# Patient Record
Sex: Female | Born: 1965 | Race: Black or African American | Hispanic: No | Marital: Single | Smoking: Current some day smoker
Health system: Southern US, Community
[De-identification: ages and names within clinical notes are randomized; demographics above are authoritative.]

## PROBLEM LIST (undated history)

## (undated) DIAGNOSIS — R569 Unspecified convulsions: Secondary | ICD-10-CM

## (undated) DIAGNOSIS — I219 Acute myocardial infarction, unspecified: Secondary | ICD-10-CM

## (undated) DIAGNOSIS — F419 Anxiety disorder, unspecified: Secondary | ICD-10-CM

## (undated) DIAGNOSIS — K219 Gastro-esophageal reflux disease without esophagitis: Secondary | ICD-10-CM

## (undated) DIAGNOSIS — F209 Schizophrenia, unspecified: Secondary | ICD-10-CM

## (undated) DIAGNOSIS — I10 Essential (primary) hypertension: Secondary | ICD-10-CM

## (undated) DIAGNOSIS — E7800 Pure hypercholesterolemia, unspecified: Secondary | ICD-10-CM

## (undated) DIAGNOSIS — G4730 Sleep apnea, unspecified: Secondary | ICD-10-CM

## (undated) DIAGNOSIS — F32A Depression, unspecified: Secondary | ICD-10-CM

## (undated) HISTORY — DX: Gastro-esophageal reflux disease without esophagitis: K21.9

## (undated) HISTORY — PX: DILATION AND CURETTAGE OF UTERUS: SHX78

## (undated) HISTORY — DX: Sleep apnea, unspecified: G47.30

## (undated) HISTORY — DX: Essential (primary) hypertension: I10

## (undated) HISTORY — PX: TONSILLECTOMY: SUR1361

## (undated) HISTORY — DX: Depression, unspecified: F32.A

## (undated) HISTORY — PX: FOOT SURGERY: SHX648

## (undated) HISTORY — PX: CARDIAC CATHETERIZATION: SHX172

## (undated) HISTORY — DX: Pure hypercholesterolemia, unspecified: E78.00

---

## 2011-12-07 ENCOUNTER — Emergency Department: Payer: Self-pay | Admitting: Emergency Medicine

## 2012-05-05 ENCOUNTER — Emergency Department: Payer: Self-pay | Admitting: Emergency Medicine

## 2014-09-28 ENCOUNTER — Ambulatory Visit: Payer: Self-pay | Admitting: Internal Medicine

## 2014-12-03 ENCOUNTER — Emergency Department: Payer: Self-pay | Admitting: Emergency Medicine

## 2016-04-08 ENCOUNTER — Emergency Department: Payer: Medicare Other

## 2016-04-08 ENCOUNTER — Emergency Department
Admission: EM | Admit: 2016-04-08 | Discharge: 2016-04-08 | Disposition: A | Discharge status: Home / Self Care | Payer: Medicare Other | Attending: Student | Admitting: Student

## 2016-04-08 ENCOUNTER — Encounter: Payer: Self-pay | Admitting: Emergency Medicine

## 2016-04-08 DIAGNOSIS — Z79899 Other long term (current) drug therapy: Secondary | ICD-10-CM | POA: Insufficient documentation

## 2016-04-08 DIAGNOSIS — F419 Anxiety disorder, unspecified: Secondary | ICD-10-CM

## 2016-04-08 DIAGNOSIS — F250 Schizoaffective disorder, bipolar type: Secondary | ICD-10-CM

## 2016-04-08 DIAGNOSIS — Z87891 Personal history of nicotine dependence: Secondary | ICD-10-CM | POA: Insufficient documentation

## 2016-04-08 DIAGNOSIS — I252 Old myocardial infarction: Secondary | ICD-10-CM | POA: Diagnosis not present

## 2016-04-08 DIAGNOSIS — F259 Schizoaffective disorder, unspecified: Secondary | ICD-10-CM

## 2016-04-08 DIAGNOSIS — Z8669 Personal history of other diseases of the nervous system and sense organs: Secondary | ICD-10-CM | POA: Diagnosis not present

## 2016-04-08 HISTORY — DX: Schizophrenia, unspecified: F20.9

## 2016-04-08 HISTORY — DX: Acute myocardial infarction, unspecified: I21.9

## 2016-04-08 HISTORY — DX: Unspecified convulsions: R56.9

## 2016-04-08 HISTORY — DX: Anxiety disorder, unspecified: F41.9

## 2016-04-08 LAB — COMPREHENSIVE METABOLIC PANEL
ALBUMIN: 4.1 g/dL (ref 3.5–5.0)
ALK PHOS: 70 U/L (ref 38–126)
ALT: 20 U/L (ref 14–54)
ANION GAP: 7 (ref 5–15)
AST: 21 U/L (ref 15–41)
BILIRUBIN TOTAL: 0.6 mg/dL (ref 0.3–1.2)
BUN: 18 mg/dL (ref 6–20)
CALCIUM: 9.2 mg/dL (ref 8.9–10.3)
CO2: 22 mmol/L (ref 22–32)
CREATININE: 0.85 mg/dL (ref 0.44–1.00)
Chloride: 108 mmol/L (ref 101–111)
GFR calc Af Amer: >60 mL/min (ref >60)
GFR calc non Af Amer: >60 mL/min (ref >60)
GLUCOSE: 96 mg/dL (ref 65–99)
Potassium: 3.6 mmol/L (ref 3.5–5.1)
Sodium: 137 mmol/L (ref 135–145)
TOTAL PROTEIN: 7.8 g/dL (ref 6.5–8.1)

## 2016-04-08 LAB — CBC WITH DIFFERENTIAL/PLATELET
Basophils Absolute: 0.1 10*3/uL (ref 0–0.1)
Eosinophils Absolute: 0.1 10*3/uL (ref 0–0.7)
Eosinophils Relative: 1 %
HEMATOCRIT: 37.9 % (ref 35.0–47.0)
HEMOGLOBIN: 12.6 g/dL (ref 12.0–16.0)
LYMPHS ABS: 2.9 10*3/uL (ref 1.0–3.6)
Lymphocytes Relative: 29 %
MCH: 28.1 pg (ref 26.0–34.0)
MCHC: 33.2 g/dL (ref 32.0–36.0)
MCV: 84.4 fL (ref 80.0–100.0)
Monocytes Absolute: 0.5 10*3/uL (ref 0.2–0.9)
NEUTROS ABS: 6.3 10*3/uL (ref 1.4–6.5)
Platelets: 225 10*3/uL (ref 150–440)
RBC: 4.49 MIL/uL (ref 3.80–5.20)
RDW: 14.8 % — ABNORMAL HIGH (ref 11.5–14.5)
WBC: 9.8 10*3/uL (ref 3.6–11.0)

## 2016-04-08 LAB — ETHANOL

## 2016-04-08 LAB — SALICYLATE LEVEL: Salicylate Lvl: <4 mg/dL (ref 2.8–30.0)

## 2016-04-08 LAB — ACETAMINOPHEN LEVEL: Acetaminophen (Tylenol), Serum: <10 ug/mL — ABNORMAL LOW (ref 10–30)

## 2016-04-08 LAB — FIBRIN DERIVATIVES D-DIMER (ARMC ONLY): Fibrin derivatives D-dimer (ARMC): 334 (ref 0–499)

## 2016-04-08 LAB — TROPONIN I: Troponin I: <0.03 ng/mL (ref <0.031)

## 2016-04-08 MED ORDER — LORAZEPAM 1 MG PO TABS
1.0000 mg | ORAL_TABLET | Freq: Once | ORAL | Status: AC
  Administered 2016-04-08: 1 mg via ORAL
  Filled 2016-04-08: qty 1

## 2016-04-08 MED ORDER — HALOPERIDOL 1 MG PO TABS: 1.0000 mg | ORAL_TABLET | Freq: Two times a day (BID) | ORAL | Status: DC

## 2016-04-08 NOTE — BH Assessment (Signed)
Assessment Note  Kelly [name] is an 50 y.o. female who presents to the ER, initially due to having concerns about her anxiety.  Patient states she is having seizures and difficulty breathing. She was requesting to be put in a comma and placed on a breathing machine. However, patient has a history of schizophrenia and hasn't taking her medications in over two weeks. She was receiving outpatient treatment with [facility]. She stop going, due to "the doctor didn't listen to me. I told him the medicine wasn't working but he kept trying to make it work. So I fixed him. I stop going and stop taking the medicine (Abilify Kelly [name])." She further states, she had her neurologist prescribe her psychotropic medications. "He's be encouraging me to go to a psychiatrist t but I can't find one who takes Medicare."  Initially part of the interview, the patient was guarded and suspicious of this Clinical research associatewriter. She eventually opened up and admitted she was having symptoms of her schizophrenia. The last four days have been the worse. She's seeing family members on the outside of her home. On last night she seen a cousin "and I know they are back home in [place] but you can't tell me she wasn't walking around, outside." She was hearing people knocking on her front door and was confused why they wasn't using the doorbell. For the last two days, she hasn't slept and barely ate. For the last month, she has had an increase of panic attacks and paranoia. Patient has had multiple inpatient stays due to similar presentation. Overall, "this last year have been the worse. It's like I been on this downward spiral."  Patient has two adult children and haven't talked with them in over a year. "I just don't won't to be bothered. I just want to be left alone." She further states, she don't have many natural supports. She currently lives independent, in her own apartment.  Patient is originally from [place]. She moved to  [place] approximately 3 years ago to take care of her mother. Mother was diagnosed with breast cancer. After the mother passed away, she moved to [place]. The move took place approximately a year and a half ago. "When I was in [place] I was on the right medicine. When I moved down her, they mess me all the way up." The regiment of medications that have worked for her was; Haldol, Lithium and Proxalin.  Diagnosis: Schizophrenia  Past Medical History:  Past Medical History  Diagnosis Date  . Anxiety   . Seizures (HCC)   . Schizophrenia (HCC)   . Myocardial infarction [facility](HCC)     Past Surgical History  Procedure Laterality Date  . Cardiac catheterization    . Tonsillectomy    . Dilation and curettage of uterus      Family History: No family history on file.  Social History:  reports that she has quit smoking. She does not have any smokeless tobacco history on file. She reports that she does not drink alcohol. Her drug history is not on file.  Additional Social History:  Alcohol / Drug Use Pain Medications: See PTA Prescriptions: See PTA Over the Counter: See PTA History of alcohol / drug use?: No history of alcohol / drug abuse (Reports of having no history abuse) Negative Consequences of Use:  (Reports of having no history abuse) Withdrawal Symptoms:  (Reports of having no history abuse)  CIWA: CIWA-Ar BP: (!) 156/75 mmHg Pulse Rate: 73 COWS:    Allergies: No Known  Allergies  Home Medications:  (Not in a hospital admission)  OB/GYN Status:  Patient's last menstrual period was 03/16/2016.  General Assessment Data Location of Assessment: [facility] ED TTS Assessment: In system Is this a Tele or Face-to-Face Assessment?: Face-to-Face Is this an Initial Assessment or a Re-assessment for this encounter?: Initial Assessment Marital status: Single Maiden name: n/a Is patient pregnant?: No Pregnancy Status: No Living Arrangements: Alone Can pt return to current living  arrangement?: Yes Admission Status: Involuntary Is patient capable of signing voluntary admission?: Yes Referral Source: Self/Family/Friend Insurance type: Medicare  Medical Screening Exam [facility]) Medical Exam completed: Yes  Crisis Care Plan Living Arrangements: Alone Legal Guardian: Other: (None) Name of Psychiatrist: Was with [facility]  Name of Therapist: Was with [facility]   Education Status Is patient currently in school?: No Current Grade: n/a Highest grade of school patient has completed: 12th Grade Name of school: n/a Contact person: n/a  Risk to self with the past 6 months Suicidal Ideation: No Has patient been a risk to self within the past 6 months prior to admission? : No Suicidal Intent: No Has patient had any suicidal intent within the past 6 months prior to admission? : No Is patient at risk for suicide?: No Suicidal Plan?: No Has patient had any suicidal plan within the past 6 months prior to admission? : No Access to Means: No What has been your use of drugs/alcohol within the last 12 months?: THC and Alcohol Previous Attempts/Gestures: Yes How many times?: 3 Other Self Harm Risks: Reports of none Triggers for Past Attempts: Unpredictable Intentional Self Injurious Behavior: None Family Suicide History: Unknown Recent stressful life event(s): Other (Comment), Loss (Comment) (Off medications) Persecutory voices/beliefs?: No Depression: Yes Depression Symptoms: Feeling angry/irritable, Feeling worthless/self pity, Loss of interest in usual pleasures, Guilt, Fatigue, Isolating, Tearfulness Substance abuse history and/or treatment for substance abuse?: No Suicide prevention information given to non-admitted patients: Not applicable  Risk to Others within the past 6 months Homicidal Ideation: No Does patient have any lifetime risk of violence toward others beyond the six months prior to admission? : No Thoughts of  Harm to Others: No Current Homicidal Intent: No Current Homicidal Plan: No Access to Homicidal Means: No Identified Victim: Reports of none History of harm to others?: No Assessment of Violence: None Noted Violent Behavior Description: Reports of none Does patient have access to weapons?: No Criminal Charges Pending?: No Does patient have a court date: No Is patient on probation?: No  Psychosis Hallucinations: Auditory, Visual Delusions: Unspecified  Mental Status Report Appearance/Hygiene: In scrubs, In hospital gown, Unremarkable Eye Contact: Good Motor Activity: Unremarkable, Freedom of movement Speech: Logical/coherent, Unremarkable Level of Consciousness: Alert Mood: Anxious, Pleasant, Depressed Affect: Appropriate to circumstance Anxiety Level: Minimal Thought Processes: Coherent, Relevant Judgement: Partial Orientation: Person, Place, Time, Situation, Appropriate for developmental age Obsessive Compulsive Thoughts/Behaviors: Minimal  Cognitive Functioning Concentration: Normal Memory: Recent Intact, Remote Intact IQ: Average Insight: Fair Impulse Control: Poor Appetite: Fair Weight Loss: 10 (Within 30 days) Weight Gain: 0 Sleep: Decreased Total Hours of Sleep: 2 Vegetative Symptoms: None  ADLScreening [facility] Assessment Services) Patient's cognitive ability adequate to safely complete daily activities?: Yes Patient able to express need for assistance with ADLs?: Yes Independently performs ADLs?: Yes (appropriate for developmental age)  Prior Inpatient Therapy Prior Inpatient Therapy: Yes Prior Therapy Dates: 2015 Prior Therapy Facilty/Provider(s): [facility] in [place]  Reason for Treatment: Schizophrenia  Prior Outpatient Therapy Prior Outpatient Therapy: Yes Prior Therapy Dates:  02/2016 Prior Therapy Facilty/Provider(s): [facility] Reason for Treatment: Schizophrenia Does patient have an ACCT team?: No Does patient have Intensive  In-House Services?  : No Does patient have [facility] services? : No Does patient have P4CC services?: No  ADL Screening (condition at time of admission) Patient's cognitive ability adequate to safely complete daily activities?: Yes Is the patient deaf or have difficulty hearing?: No Does the patient have difficulty seeing, even when wearing glasses/contacts?: No Does the patient have difficulty concentrating, remembering, or making decisions?: No Patient able to express need for assistance with ADLs?: Yes Does the patient have difficulty dressing or bathing?: No Independently performs ADLs?: Yes (appropriate for developmental age) Does the patient have difficulty walking or climbing stairs?: No Weakness of Legs: None Weakness of Arms/Hands: None  Home Assistive Devices/Equipment Home Assistive Devices/Equipment: None  Therapy Consults (therapy consults require a physician order) PT Evaluation Needed: No OT Evalulation Needed: No SLP Evaluation Needed: No Abuse/Neglect Assessment (Assessment to be complete while patient is alone) Physical Abuse: Denies Verbal Abuse: Denies Sexual Abuse: Denies Exploitation of patient/patient's resources: Denies Self-Neglect: Denies Values / Beliefs Cultural Requests During Hospitalization: None Spiritual Requests During Hospitalization: None Consults Spiritual Care Consult Needed: No Social Work Consult Needed: No      Additional Information 1:1 In Past 12 Months?: No CIRT Risk: No Elopement Risk: No Does patient have medical clearance?: Yes  Child/Adolescent Assessment Running Away Risk: Denies (Patient is an adult)  Disposition:  Disposition Initial Assessment Completed for this Encounter: Yes Disposition of Patient: Other dispositions (ER MD ordered Psych Consult) Other disposition(s): Other (Comment) (ER MD ordered Psych Consult)  On Site Evaluation by:   Reviewed with Physician:    Lilyan Gilford MS, LCAS, LPC, NCC,  CCSI Therapeutic Triage Specialist 04/08/2016 12:59 PM

## 2016-04-08 NOTE — ED Notes (Signed)
Pt stated she can't breathe because she has cancer. The doctors say she doesn't but she knows better.

## 2016-04-08 NOTE — ED Notes (Signed)
Pt changed into purple scrubs, underwear and socks. Pt belonging in two bags, labeled and placed next to quad RN at this time.

## 2016-04-08 NOTE — ED Notes (Addendum)
Pt states "I want to be put in a coma and put on the breathing machine so I can breathe easier". Pt reports she's having trouble breathing due to her sinuses draining. Pt reports URI sx due to standing outside at night in the rain. Pt reports she's been standing outside in the rain due to someone knocking on her door at night and not using the doorbell; reports she's been outside looking for him. Pt reports reduced use of abilify and haldol without doctor recommendation.

## 2016-04-08 NOTE — ED Notes (Signed)
Pt given lunch tray. Calvin with pt at this time.

## 2016-04-08 NOTE — ED Notes (Signed)
Pt placed on monitor. Pt informed urine is needed and pt stated she is tired of us asking for stuff when the only thing she ask for is a coma and we will not do that.

## 2016-04-08 NOTE — ED Notes (Signed)
Pt reports clitoris piercing and right nipple ring; MD notified, permission to leave in patient.

## 2016-04-08 NOTE — ED Notes (Signed)
Pt lying in bed resting

## 2016-04-08 NOTE — ED Provider Notes (Signed)
[facility] Emergency Department Provider Note   ____________________________________________  Time seen: Approximately 11:02 AM  I have reviewed the triage vital signs and the nursing notes.   HISTORY  Chief Complaint Shortness of Breath and Anxiety    HPI Kelly [name] is a 50 y.o. female history of coronary artery disease, anxiety provoked seizures, schizoaffective disorder who presents for evaluation of anxiety and shortness of breath over the past 4 days, gradual onset, constant, severe. The patient reports that she has been having 4 days of cough, nasal congestion worse at night because she feels like her sinuses are draining. She is requesting  "to be put into a coma and put on a breathing machine until my breathing is better.Marland Kitchen.Marland Kitchen.I don't know why you can't just do that". She also reports that she has been standing outside in the rain at night because somebody is coming to knock on her door repeatedly and they are not using the doorbell. Every time she goes to answer the door, no one is there. She has not been compliant with her antipsychotic medications. She denies any chest pain. No fevers, no vomiting or diarrhea. No SI or HI. She had an anxiety induced seizure last night.   Past Medical History  Diagnosis Date  . Anxiety   . Seizures (HCC)   . Schizophrenia (HCC)   . Myocardial infarction [facility](HCC)     Patient Active Problem List   Diagnosis Date Noted  . Schizoaffective disorder (HCC) 04/08/2016  . Acute anxiety 04/08/2016    Past Surgical History  Procedure Laterality Date  . Cardiac catheterization    . Tonsillectomy    . Dilation and curettage of uterus      Current Outpatient Rx  Name  Route  Sig  Dispense  Refill  . citalopram (CELEXA) 20 MG tablet   Oral   Take 1 tablet by mouth daily.      3   . lamoTRIgine (LAMICTAL) 200 MG tablet   Oral   Take 200 mg by mouth 2 (two) times daily.      2   . VIMPAT 200 MG TABS tablet  Oral   Take 200 mg by mouth 3 (three) times daily.      3     Dispense as written.   . zonisamide (ZONEGRAN) 100 MG capsule   Oral   Take 200 mg by mouth at bedtime.      2   . haloperidol (HALDOL) 1 MG tablet   Oral   Take 1 tablet (1 mg total) by mouth 2 (two) times daily.   60 tablet   0     Allergies Review of patient's allergies indicates no known allergies.  No family history on file.  Social History Social History  Substance Use Topics  . Smoking status: Former Games developermoker  . Smokeless tobacco: None  . Alcohol Use: No    Review of Systems Constitutional: No fever/chills Eyes: No visual changes. ENT: No sore throat. Cardiovascular: Denies chest pain. Respiratory: + shortness of breath. Gastrointestinal: No abdominal pain.  No nausea, no vomiting.  No diarrhea.  No constipation. Genitourinary: Negative for dysuria. Musculoskeletal: Negative for back pain. Skin: Negative for rash. Neurological: Negative for headaches, focal weakness or numbness.  10-point ROS otherwise negative.  ____________________________________________   PHYSICAL EXAM:  VITAL SIGNS: ED Triage Vitals  Enc Vitals Group     BP 04/08/16 0955 156/75 mmHg     Pulse Rate 04/08/16 0955 73  Resp 04/08/16 0955 18     Temp 04/08/16 0955 99.1 F (37.3 C)     Temp Source 04/08/16 0955 Oral     SpO2 04/08/16 0955 97 %     Weight 04/08/16 0955 194 lb 3 oz (88.083 kg)     Height 04/08/16 0955 5\' 5"  (1.651 m)     Head Cir --      Peak Flow --      Pain Score --      Pain Loc --      Pain Edu? --      Excl. in GC? --     Constitutional: Alert and oriented. Well appearing and in no acute distress. Eyes: Conjunctivae are normal. PERRL. EOMI. Head: Atraumatic. Nose: No congestion/rhinnorhea. Mouth/Throat: Mucous membranes are moist.  Oropharynx non-erythematous. Neck: No stridor.  Supple without meningismus. Cardiovascular: Normal rate, regular rhythm. Grossly normal heart sounds.   Good peripheral circulation. Respiratory: Normal respiratory effort.  No retractions. Lungs CTAB. Gastrointestinal: Soft and nontender. No distention.  No CVA tenderness. Genitourinary: deferred Musculoskeletal: No lower extremity tenderness nor edema.  No joint effusions. Neurologic:  Normal speech and language. No gross focal neurologic deficits are appreciated. No gait instability. Skin:  Skin is warm, dry and intact. No rash noted. Psychiatric: Mood is anxious, affect is somewhat labile. She does have significant psychomotor agitation.  ____________________________________________   LABS (all labs ordered are listed, but only abnormal results are displayed)  Labs Reviewed  CBC WITH DIFFERENTIAL/PLATELET - Abnormal; Notable for the following:    RDW 14.8 (*)    All other components within normal limits  ACETAMINOPHEN LEVEL - Abnormal; Notable for the following:    Acetaminophen (Tylenol), Serum <10 (*)    All other components within normal limits  COMPREHENSIVE METABOLIC PANEL  TROPONIN I  FIBRIN DERIVATIVES D-DIMER ([facility] ONLY)  ETHANOL  SALICYLATE LEVEL  URINALYSIS COMPLETEWITH MICROSCOPIC ([facility] ONLY)  POC URINE PREG, ED   ____________________________________________  EKG  ED ECG REPORT I, Gayla Doss, the attending physician, personally viewed and interpreted this ECG.   Date: 04/08/2016  EKG Time: 09:54  Rate: 77  Rhythm:  normal sinus rhythm  Axis: normal  Intervals:none  ST&T Change: No acute ST elevation. Q waves in lead 1, 2, aVL. T wave inversions in 1, 2, aVL. No prior EKG available for comparison.  ____________________________________________  RADIOLOGY  CXR  IMPRESSION: Negative, no acute cardiopulmonary abnormality. ____________________________________________   PROCEDURES  Procedure(s) performed: None  Critical Care performed: No  ____________________________________________   INITIAL IMPRESSION / ASSESSMENT AND PLAN / ED  COURSE  Pertinent labs & imaging results that were available during my care of the patient were reviewed by me and considered in my medical decision making (see chart for details).  Kelly [name] is a 50 y.o. female history of coronary artery disease, anxiety provoked seizures, schizoaffective disorder who presents for evaluation of anxiety and shortness of breath over the past 4 days, gradual onset, constant, severe. On exam she is nontoxic appearing and in no acute distress. Vital signs stable, she is afebrile. No increased work of breathing, clear lung sounds. EKG is reassuring, chest x-ray shows no acute cardio pulmonary abnormality. Suspect that her symptoms may be secondary to sinusitis but that been going on for 4 days and is likely viral. We'll obtain screening psychiatry labs, consult behavioral health as well psychiatry, will observe briefly on the cardiac monitor.  ----------------------------------------- 3:27 PM on 04/08/2016 ----------------------------------------- CBC, CMP unremarkable. Negative troponin, doubt atypical ACS  presentation. D-dimer is not elevated, doubt PE, undetectable ethanol, salicylate and acetaminophen levels. Dr. Toni Amend has evaluated the patient, recommends discharge, he has provided a perscription for Haldol and she will follow-up with [facility]. DC with return precautions, she is comfortable with the discharge plan. ____________________________________________   FINAL CLINICAL IMPRESSION(S) / ED DIAGNOSES  Final diagnoses:  Anxiety  Schizoaffective disorder, unspecified type (HCC)      NEW MEDICATIONS STARTED DURING THIS VISIT:  Current Discharge Medication List    START taking these medications   Details  haloperidol (HALDOL) 1 MG tablet Take 1 tablet (1 mg total) by mouth 2 (two) times daily. Qty: 60 tablet, Refills: 0         Note:  This document was prepared using Dragon voice recognition software and may include unintentional dictation  errors.    Gayla Doss, MD 04/08/16 [phone]

## 2016-04-08 NOTE — ED Notes (Signed)
Pt arrived via EMS from home for reports of shortness of breath and anxiety. Pt states she had a seizure last night and she thinks she may have had one this morning. Pt told EMS her neurologist thinks her seizures are anxiety induced and is treating them with anxiety medication. EMS reports pt was hyperventilating upon their arrival. EMS reports CBG 91, 144/61, 67 HR, 100% RA, pCO2 30, NSR.

## 2016-04-08 NOTE — Consult Note (Signed)
Kelly [name]   Reason for [name]:  [name] for this 50 year old woman with a reported past history of mental illness who came to the emergency room reporting that she has trouble breathing Referring Physician:  Edd Fabian Patient Identification: Kelly [name] MRN:  [id number] Principal Diagnosis: Schizoaffective disorder [facility]) Diagnosis:   Patient Active Problem List   Diagnosis Date Noted  . Schizoaffective disorder ([place]) [F25.9] 04/08/2016  . Acute anxiety [F41.9] 04/08/2016    Total Time spent with patient: 1 hour  Subjective:   Kelly [name] is a 50 y.o. female patient admitted with "I feel like I can't catch my breath but they say my breathing is fine".  HPI:  50 year old woman. Patient interviewed. Chart reviewed. Case discussed with emergency room physician and TTS. Labs and vitals reviewed. Patient says that for the last month or so she's been feeling increasingly anxious. Feels nervous much of the time. Starting to have what sound like panic attacks symptoms fairly regularly. Feels like she can't catch her breath although her breathing here in the emergency room is completely fine. She says she sleeps poorly at night and will nap some during the day. Mood feels down and nervous much of the time. Recently has had a few episodes of seeing things such as seeing her sister walking around when she knows that her sister lives in [place]. Patient says she is taking her current medicines but she is not going to see a psychiatrist. She is only being prescribed medicine for her seizures right now. Patient denies any suicidal ideation or wish to harm herself. Denies homicidal ideation. She says that she has a history of "schizophrenia and bipolar disorder" and used to go to [facility] but stopped going several months ago because she didn't like the environment. Denies abuse of alcohol or any other recreational drugs. Says that she stays pretty isolated and feels like she has a  lot of worries.  Social history: Patient is currently living by herself in [place]. She had been in [place] until a couple years ago when she moved to [place] to be near her mother. Mother died shortly thereafter and the patient has just stayed around here. There he limited social contacts.  Medical history: History of a seizure disorder. Sees a neurologist in. She says that when she gets anxious she has more of these seizures. No other known medical problem.  Substance abuse history: Denies alcohol or drug abuse and denies any past history of substance abuse issues.  Past Psychiatric History: Patient says she's had many admissions to psychiatric hospitals going back years. She claims to have been diagnosed as either schizophrenic or having bipolar disorder. Putting that together with her current presentation I would guess that she probably has schizoaffective disorder. She says when she was taking lithium and Haldol years ago she found it to be effective but she is not on any of that medicine currently. She tried to get her neurologist to prescribe it but he just wants her to see a psychiatrist. Patient says that she's only had 1 suicide attempt and it was over a decade ago. No recent suicidal ideation. Denies any history of violence to others.  Risk to Self: Suicidal Ideation: No Suicidal Intent: No Is patient at risk for suicide?: No Suicidal Plan?: No Access to Means: No What has been your use of drugs/alcohol within the last 12 months?: THC and Alcohol How many times?: 3 Other Self Harm Risks: Reports of none Triggers for Past Attempts: Unpredictable Intentional Self  Injurious Behavior: None Risk to Others: Homicidal Ideation: No Thoughts of Harm to Others: No Current Homicidal Intent: No Current Homicidal Plan: No Access to Homicidal Means: No Identified Victim: Reports of none History of harm to others?: No Assessment of Violence: None Noted Violent Behavior Description:  Reports of none Does patient have access to weapons?: No Criminal Charges Pending?: No Does patient have a court date: No Prior Inpatient Therapy: Prior Inpatient Therapy: Yes Prior Therapy Dates: 2015 Prior Therapy Facilty/Provider(s): [facility] in [place]  Reason for Treatment: Schizophrenia Prior Outpatient Therapy: Prior Outpatient Therapy: Yes Prior Therapy Dates: 02/2016 Prior Therapy Facilty/Provider(s): [facility] Reason for Treatment: Schizophrenia Does patient have an ACCT team?: No Does patient have Intensive In-House Services?  : No Does patient have [facility] services? : No Does patient have P4CC services?: No  Past Medical History:  Past Medical History  Diagnosis Date  . Anxiety   . Seizures ([place])   . Schizophrenia ([place])   . Myocardial infarction [facility])     Past Surgical History  Procedure Laterality Date  . Cardiac catheterization    . Tonsillectomy    . Dilation and curettage of uterus     Family History: No family history on file. Family Psychiatric  History: Patient denies being aware of any family history of mental illness or substance abuse problems whatsoever. Social History:  History  Alcohol Use No     History  Drug Use Not on file    Social History   Social History  . Marital Status: Single    Spouse Name: N/A  . Number of Children: N/A  . Years of Education: N/A   Social History Main Topics  . Smoking status: Former Research scientist (life sciences)  . Smokeless tobacco: None  . Alcohol Use: No  . Drug Use: None  . Sexual Activity: Not Asked   Other Topics Concern  . None   Social History Narrative  . None   Additional Social History:    Allergies:  No Known Allergies  Labs:  Results for orders placed or performed during the hospital encounter of 04/08/16 (from the past 48 hour(s))  CBC with Differential     Status: Abnormal   Collection Time: 04/08/16 10:15 AM  Result Value Ref Range   WBC 9.8 3.6 - 11.0 K/uL   RBC 4.49 3.80 - 5.20  MIL/uL   Hemoglobin 12.6 12.0 - 16.0 g/dL   HCT 37.9 35.0 - 47.0 %   MCV 84.4 80.0 - 100.0 fL   MCH 28.1 26.0 - 34.0 pg   MCHC 33.2 32.0 - 36.0 g/dL   RDW 14.8 (H) 11.5 - 14.5 %   Platelets 225 150 - 440 K/uL   Neutrophils Relative % 64% %   Neutro Abs 6.3 1.4 - 6.5 K/uL   Lymphocytes Relative 29% %   Lymphs Abs 2.9 1.0 - 3.6 K/uL   Monocytes Relative 5% %   Monocytes Absolute 0.5 0.2 - 0.9 K/uL   Eosinophils Relative 1% %   Eosinophils Absolute 0.1 0 - 0.7 K/uL   Basophils Relative 1% %   Basophils Absolute 0.1 0 - 0.1 K/uL  Comprehensive metabolic panel     Status: None   Collection Time: 04/08/16 10:15 AM  Result Value Ref Range   Sodium 137 135 - 145 mmol/L   Potassium 3.6 3.5 - 5.1 mmol/L   Chloride 108 101 - 111 mmol/L   CO2 22 22 - 32 mmol/L   Glucose, Bld 96 65 - 99 mg/dL  BUN 18 6 - 20 mg/dL   Creatinine, Ser 0.85 0.44 - 1.00 mg/dL   Calcium 9.2 8.9 - 10.3 mg/dL   Total Protein 7.8 6.5 - 8.1 g/dL   Albumin 4.1 3.5 - 5.0 g/dL   AST 21 15 - 41 U/L   ALT 20 14 - 54 U/L   Alkaline Phosphatase 70 38 - 126 U/L   Total Bilirubin 0.6 0.3 - 1.2 mg/dL   GFR calc non Af Amer >60 >60 mL/min   GFR calc Af Amer >60 >60 mL/min    Comment: (NOTE) The eGFR has been calculated using the CKD EPI equation. This calculation has not been validated in all clinical situations. eGFR's persistently <60 mL/min signify possible Chronic Kidney Disease.    Anion gap 7 5 - 15  Troponin I     Status: None   Collection Time: 04/08/16 10:15 AM  Result Value Ref Range   Troponin I <0.03 <0.031 ng/mL    Comment:        NO INDICATION OF MYOCARDIAL INJURY.   Fibrin derivatives D-Dimer ([facility] only)     Status: None   Collection Time: 04/08/16 10:15 AM  Result Value Ref Range   Fibrin derivatives D-dimer ([facility]) 334 0 - 499    Comment: <> Exclusion of Venous Thromboembolism (VTE) - OUTPATIENTS ONLY        (Emergency Department or [place])             0-499 ng/ml (FEU)  : With a low to  intermediate pretest                                        probability for VTE this test result                                        excludes the diagnosis of VTE.           > 499 ng/ml (FEU)  : VTE not excluded.  Additional work up                                   for VTE is required.   <>  Testing on Inpatients and Evaluation of Disseminated Intravascular        Coagulation ([facility])             Reference Range:   0-499 ng/ml (FEU)   Ethanol     Status: None   Collection Time: 04/08/16 10:15 AM  Result Value Ref Range   Alcohol, Ethyl (B) <5 <5 mg/dL    Comment:        LOWEST DETECTABLE LIMIT FOR SERUM ALCOHOL IS 5 mg/dL FOR MEDICAL PURPOSES ONLY   Salicylate level     Status: None   Collection Time: 04/08/16 10:15 AM  Result Value Ref Range   Salicylate Lvl <3.1 2.8 - 30.0 mg/dL  Acetaminophen level     Status: Abnormal   Collection Time: 04/08/16 10:15 AM  Result Value Ref Range   Acetaminophen (Tylenol), Serum <10 (L) 10 - 30 ug/mL    Comment:        THERAPEUTIC CONCENTRATIONS VARY SIGNIFICANTLY. A RANGE OF 10-30 ug/mL MAY BE AN EFFECTIVE CONCENTRATION FOR MANY PATIENTS. HOWEVER, SOME ARE  BEST TREATED AT CONCENTRATIONS OUTSIDE THIS RANGE. ACETAMINOPHEN CONCENTRATIONS >150 ug/mL AT 4 HOURS AFTER INGESTION AND >50 ug/mL AT 12 HOURS AFTER INGESTION ARE OFTEN ASSOCIATED WITH TOXIC REACTIONS.     Current Facility-Administered Medications  Medication Dose Route Frequency Provider Last Rate Last Dose  . LORazepam (ATIVAN) tablet 1 mg  1 mg Oral Once Joanne Gavel, MD       Current Outpatient Prescriptions  Medication Sig Dispense Refill  . citalopram (CELEXA) 20 MG tablet Take 1 tablet by mouth daily.  3  . lamoTRIgine (LAMICTAL) 200 MG tablet Take 200 mg by mouth 2 (two) times daily.  2  . VIMPAT 200 MG TABS tablet Take 200 mg by mouth 3 (three) times daily.  3  . zonisamide (ZONEGRAN) 100 MG capsule Take 200 mg by mouth at bedtime.  2  . haloperidol (HALDOL) 1  MG tablet Take 1 tablet (1 mg total) by mouth 2 (two) times daily. 60 tablet 0    Musculoskeletal: Strength & Muscle Tone: within normal limits Gait & Station: normal Patient leans: N/A  Psychiatric Specialty Exam: Physical Exam  Nursing note and vitals reviewed. Constitutional: She appears well-developed and well-nourished.  HENT:  Head: Normocephalic and atraumatic.  Eyes: Conjunctivae are normal. Pupils are equal, round, and reactive to light.  Neck: Normal range of motion.  Cardiovascular: Normal rate, regular rhythm and normal heart sounds.   Respiratory: Effort normal and breath sounds normal. No respiratory distress.  GI: Soft.  Musculoskeletal: Normal range of motion.  Neurological: She is alert.  Skin: Skin is warm and dry.  Psychiatric: Judgment and thought content normal. Her mood appears anxious. Her speech is delayed. She is slowed and withdrawn. Cognition and memory are normal.    Review of Systems  Constitutional: Negative.   HENT: Negative.   Eyes: Negative.   Respiratory: Negative.   Cardiovascular: Negative.   Gastrointestinal: Negative.   Musculoskeletal: Negative.   Skin: Negative.   Neurological: Positive for seizures.  Psychiatric/Behavioral: Positive for hallucinations. Negative for depression, suicidal ideas, memory loss and substance abuse. The patient is nervous/anxious and has insomnia.     Blood pressure 156/75, pulse 73, temperature 99.1 F (37.3 C), temperature source Oral, resp. rate 18, height 5' 5"  (1.651 m), weight 88.083 kg (194 lb 3 oz), last menstrual period 03/16/2016, SpO2 97 %.Body mass index is 32.31 kg/(m^2).  General Appearance: Fairly Groomed  Eye Contact:  Fair  Speech:  Normal Rate  Volume:  Decreased  Mood:  Dysphoric  Affect:  Blunt  Thought Process:  Goal Directed  Orientation:  Full (Time, Place, and Person)  Thought Content:  Hallucinations: Visual  Suicidal Thoughts:  No  Homicidal Thoughts:  No  Memory:  Immediate;    Good Recent;   Fair Remote;   Fair  Judgement:  Fair  Insight:  Fair  Psychomotor Activity:  Decreased  Concentration:  Concentration: Fair  Recall:  [employer] of Knowledge:  Fair  Language:  Fair  Akathisia:  No  Handed:  Right  AIMS (if indicated):     Assets:  Communication Skills Desire for Improvement Housing Resilience  ADL's:  Intact  Cognition:  WNL  Sleep:        Treatment Plan Summary: Medication management and Plan This is a 50 year old woman who gives past history of having had chronic mental health problems. Currently she is having worsening anxiety with frequent anxiety attacks. She reports one instance of visual hallucinations recently but does not have delusions about  it and has not shown any sign of acting out in a dangerous manner. She is taking care of her basic health okay. Patient does not appear to meet commitment criteria or to require inpatient psychiatric hospitalization. On the other hand she clearly would benefit from being back in regular contact with psychiatric care. If she does not want to go back to [place] she will be referred to [facility]. I do not feel comfortable giving her a prescription for lithium since I will not be following up with her but I do feel okay giving her a prescription for a low dose of Haldol. Haldol 1 mg twice a day order will be done and she can take that for the next month. Side effects reviewed. These reviewed with emergency room physician. Patient can be discharged at their discretion.  Disposition: Patient does not meet criteria for psychiatric inpatient admission. Supportive therapy provided about ongoing stressors. Discussed crisis plan, support from social network, calling 911, coming to the Emergency Department, and calling Suicide Hotline.  Alethia Berthold, MD 04/08/2016 1:58 PM

## 2018-01-26 ENCOUNTER — Encounter: Payer: Self-pay | Admitting: Obstetrics and Gynecology

## 2018-01-26 ENCOUNTER — Ambulatory Visit: Payer: Self-pay | Admitting: Obstetrics and Gynecology

## 2018-02-07 ENCOUNTER — Other Ambulatory Visit: Payer: Self-pay | Admitting: Acute Care

## 2018-02-07 DIAGNOSIS — R569 Unspecified convulsions: Secondary | ICD-10-CM

## 2018-02-09 ENCOUNTER — Encounter: Payer: Self-pay | Admitting: Obstetrics and Gynecology

## 2018-02-14 ENCOUNTER — Ambulatory Visit: Payer: Medicare Other

## 2018-02-16 ENCOUNTER — Ambulatory Visit (INDEPENDENT_AMBULATORY_CARE_PROVIDER_SITE_OTHER): Payer: Medicare Other | Admitting: Obstetrics and Gynecology

## 2018-02-16 ENCOUNTER — Encounter: Payer: Self-pay | Admitting: Obstetrics and Gynecology

## 2018-02-16 VITALS — BP 125/74 | HR 73 | Ht 66.0 in | Wt 202.6 lb

## 2018-02-16 DIAGNOSIS — B977 Papillomavirus as the cause of diseases classified elsewhere: Secondary | ICD-10-CM

## 2018-02-16 DIAGNOSIS — N72 Inflammatory disease of cervix uteri: Secondary | ICD-10-CM | POA: Diagnosis not present

## 2018-02-16 NOTE — Progress Notes (Signed)
HPI:      Ms. Kelly [name] is a 52 y.o. [id number] who LMP was Patient's last menstrual period was 01/27/2018 (exact date).  Subjective:   She presents today sent for positive HPV with normal cervical cytology.  Patient reports that she has had Pap smears for more than 30 years and reports that she gets them yearly.  She reports that she had a previously abnormal Pap but it was more than 20 years ago.  She denies previous colposcopy.    Hx: The following portions of the patient's history were reviewed and updated as appropriate:             She  has a past medical history of Acid reflux, Anxiety, Hypercholesteremia, Myocardial infarction (HCC), Schizophrenia (HCC), and Seizures (HCC). She does not have any pertinent problems on file. She  has a past surgical history that includes Cardiac catheterization; Tonsillectomy; Dilation and curettage of uterus; Foot surgery; and Cesarean section. Her family history includes Breast cancer in her mother; Diabetes in her maternal aunt. She  reports that she has quit smoking. She has quit using smokeless tobacco. She reports that she drinks alcohol. She reports that she has current or past drug history. Drug: Marijuana. She has a current medication list which includes the following prescription(s): vitamin d3, esomeprazole, hydroxyzine, lamotrigine, pravastatin, propranolol er, and vimpat. She has No Known Allergies.       Review of Systems:  Review of Systems  Constitutional: Denied constitutional symptoms, night sweats, recent illness, fatigue, fever, insomnia and weight loss.  Eyes: Denied eye symptoms, eye pain, photophobia, vision change and visual disturbance.  Ears/Nose/Throat/Neck: Denied ear, nose, throat or neck symptoms, hearing loss, nasal discharge, sinus congestion and sore throat.  Cardiovascular: Denied cardiovascular symptoms, arrhythmia, chest pain/pressure, edema, exercise intolerance, orthopnea and palpitations.  Respiratory: Denied  pulmonary symptoms, asthma, pleuritic pain, productive sputum, cough, dyspnea and wheezing.  Gastrointestinal: Denied, gastro-esophageal reflux, melena, nausea and vomiting.  Genitourinary: Denied genitourinary symptoms including symptomatic vaginal discharge, pelvic relaxation issues, and urinary complaints.  Musculoskeletal: Denied musculoskeletal symptoms, stiffness, swelling, muscle weakness and myalgia.  Dermatologic: Denied dermatology symptoms, rash and scar.  Neurologic: Denied neurology symptoms, dizziness, headache, neck pain and syncope.  Psychiatric: Denied psychiatric symptoms, anxiety and depression.  Endocrine: Denied endocrine symptoms including hot flashes and night sweats.   Meds:   Current Outpatient Medications on File Prior to Visit  Medication Sig Dispense Refill  . Cholecalciferol (VITAMIN D3) 50000 units CAPS TAKE ONE CAPSULE BY MOUTH ONE TIME PER WEEK  11  . esomeprazole (NEXIUM) 40 MG capsule Take by mouth daily.  3  . hydrOXYzine (ATARAX/VISTARIL) 10 MG tablet Take 10 mg by mouth 3 (three) times daily as needed. for anxiety  1  . lamoTRIgine (LAMICTAL) 200 MG tablet Take 200 mg by mouth 2 (two) times daily.  2  . pravastatin (PRAVACHOL) 20 MG tablet TAKE 1 TABLET BY MOUTH EVERYDAY AT BEDTIME  5  . propranolol ER (INDERAL LA) 60 MG 24 hr capsule TAKE 1 CAPSULE BY MOUTH EVERYDAY AT BEDTIME  1  . VIMPAT 200 MG TABS tablet Take 200 mg by mouth 3 (three) times daily.  3   No current facility-administered medications on file prior to visit.     Objective:     Vitals:   02/16/18 1353  BP: 125/74  Pulse: 73              Records reviewed revealing normal cervical cytology, negative16,18, and positive high risk viral types  other than 16 and 18.  Assessment:    [id number] Patient Active Problem List   Diagnosis Date Noted  . Schizoaffective disorder (HCC) 04/08/2016  . Acute anxiety 04/08/2016     1. High risk human papilloma virus (HPV) infection of cervix      Positive HPV without changes in cervical cytology does confer an increased risk for cervical cancer but her risk remains low unless cervical cytology changes.  The exception would be 16 and 18 in which case it is such a risk that colposcopy is probably warranted despite negative cytology.   Plan:            1.  I have offered her colposcopy today but after a long discussion regarding the natural course and history of HPV and cervical cytology the patient has declined.  I think this is reasonable and would recommend only annual Pap smears as long as her cervical cytology remains normal.  If she develops abnormal cervical cytology I believe that at that time colposcopy is warranted. I have referred her back for annual Pap smears and cytology. 2.    Also significant noted she informs me her mammogram was abnormal and she is going for a breast biopsy.  I have stressed the importance of attending to this. Orders No orders of the defined types were placed in this encounter.   No orders of the defined types were placed in this encounter.     F/U  No follow-ups on file. I spent 33 minutes with this patient of which greater than 50% was spent discussing abnormal cervical cytology and HPV and colposcopy.  All of her questions were answered.  We also discussed breast cancer and breast biopsies.  Elonda Huskyavid J. Brandee Markin, M.D. 02/16/2018 4:08 PM

## 2018-02-21 ENCOUNTER — Ambulatory Visit
Admission: RE | Admit: 2018-02-21 | Discharge: 2018-02-21 | Disposition: A | Discharge status: Home / Self Care | Payer: Medicare Other | Source: Ambulatory Visit | Attending: Acute Care | Admitting: Acute Care

## 2018-02-21 DIAGNOSIS — R569 Unspecified convulsions: Secondary | ICD-10-CM | POA: Diagnosis not present

## 2018-02-21 MED ORDER — GADOBENATE DIMEGLUMINE 529 MG/ML IV SOLN
20.0000 mL | Freq: Once | INTRAVENOUS | Status: AC | PRN
  Administered 2018-02-21: 19 mL via INTRAVENOUS

## 2018-07-11 ENCOUNTER — Emergency Department: Payer: Medicare Other

## 2018-07-11 ENCOUNTER — Other Ambulatory Visit: Payer: Self-pay

## 2018-07-11 ENCOUNTER — Inpatient Hospital Stay
Admission: EM | Admit: 2018-07-11 | Discharge: 2018-07-13 | DRG: 103 | Disposition: A | Discharge status: Home / Self Care | Payer: Medicare Other | Attending: Internal Medicine | Admitting: Internal Medicine

## 2018-07-11 ENCOUNTER — Encounter: Payer: Self-pay | Admitting: Emergency Medicine

## 2018-07-11 DIAGNOSIS — Z9089 Acquired absence of other organs: Secondary | ICD-10-CM

## 2018-07-11 DIAGNOSIS — F329 Major depressive disorder, single episode, unspecified: Secondary | ICD-10-CM | POA: Diagnosis present

## 2018-07-11 DIAGNOSIS — G932 Benign intracranial hypertension: Secondary | ICD-10-CM | POA: Diagnosis not present

## 2018-07-11 DIAGNOSIS — Z79899 Other long term (current) drug therapy: Secondary | ICD-10-CM

## 2018-07-11 DIAGNOSIS — F209 Schizophrenia, unspecified: Secondary | ICD-10-CM | POA: Diagnosis present

## 2018-07-11 DIAGNOSIS — G40909 Epilepsy, unspecified, not intractable, without status epilepticus: Secondary | ICD-10-CM | POA: Diagnosis present

## 2018-07-11 DIAGNOSIS — R531 Weakness: Secondary | ICD-10-CM | POA: Diagnosis not present

## 2018-07-11 DIAGNOSIS — E7800 Pure hypercholesterolemia, unspecified: Secondary | ICD-10-CM | POA: Diagnosis present

## 2018-07-11 DIAGNOSIS — K219 Gastro-esophageal reflux disease without esophagitis: Secondary | ICD-10-CM | POA: Diagnosis present

## 2018-07-11 DIAGNOSIS — R51 Headache: Secondary | ICD-10-CM | POA: Diagnosis present

## 2018-07-11 DIAGNOSIS — Z833 Family history of diabetes mellitus: Secondary | ICD-10-CM

## 2018-07-11 DIAGNOSIS — Z87891 Personal history of nicotine dependence: Secondary | ICD-10-CM

## 2018-07-11 DIAGNOSIS — R296 Repeated falls: Secondary | ICD-10-CM | POA: Diagnosis present

## 2018-07-11 DIAGNOSIS — M4692 Unspecified inflammatory spondylopathy, cervical region: Secondary | ICD-10-CM | POA: Diagnosis present

## 2018-07-11 DIAGNOSIS — Z803 Family history of malignant neoplasm of breast: Secondary | ICD-10-CM

## 2018-07-11 DIAGNOSIS — I10 Essential (primary) hypertension: Secondary | ICD-10-CM | POA: Diagnosis present

## 2018-07-11 DIAGNOSIS — I2510 Atherosclerotic heart disease of native coronary artery without angina pectoris: Secondary | ICD-10-CM | POA: Diagnosis present

## 2018-07-11 DIAGNOSIS — I252 Old myocardial infarction: Secondary | ICD-10-CM

## 2018-07-11 DIAGNOSIS — F419 Anxiety disorder, unspecified: Secondary | ICD-10-CM | POA: Diagnosis present

## 2018-07-11 LAB — BASIC METABOLIC PANEL
Anion gap: 7 (ref 5–15)
BUN: 15 mg/dL (ref 6–20)
CHLORIDE: 106 mmol/L (ref 98–111)
CO2: 25 mmol/L (ref 22–32)
CREATININE: 0.71 mg/dL (ref 0.44–1.00)
Calcium: 8.8 mg/dL — ABNORMAL LOW (ref 8.9–10.3)
GFR calc non Af Amer: >60 mL/min (ref >60)
GLUCOSE: 99 mg/dL (ref 70–99)
Potassium: 4.1 mmol/L (ref 3.5–5.1)
Sodium: 138 mmol/L (ref 135–145)

## 2018-07-11 LAB — CBC
HCT: 37.8 % (ref 35.0–47.0)
Hemoglobin: 12.6 g/dL (ref 12.0–16.0)
MCH: 27.8 pg (ref 26.0–34.0)
MCHC: 33.5 g/dL (ref 32.0–36.0)
MCV: 83.1 fL (ref 80.0–100.0)
PLATELETS: 253 10*3/uL (ref 150–440)
RBC: 4.55 MIL/uL (ref 3.80–5.20)
RDW: 16.4 % — ABNORMAL HIGH (ref 11.5–14.5)
WBC: 10.9 10*3/uL (ref 3.6–11.0)

## 2018-07-11 LAB — CK: Total CK: 153 U/L (ref 38–234)

## 2018-07-11 LAB — TROPONIN I: Troponin I: <0.03 ng/mL (ref <0.03)

## 2018-07-11 LAB — AMMONIA: Ammonia: 31 umol/L (ref 9–35)

## 2018-07-11 MED ORDER — LACOSAMIDE 50 MG PO TABS
200.0000 mg | ORAL_TABLET | Freq: Two times a day (BID) | ORAL | Status: DC
  Administered 2018-07-11 – 2018-07-13 (×4): 200 mg via ORAL
  Filled 2018-07-11 (×5): qty 4

## 2018-07-11 MED ORDER — SENNOSIDES-DOCUSATE SODIUM 8.6-50 MG PO TABS
1.0000 | ORAL_TABLET | Freq: Every evening | ORAL | Status: DC | PRN
  Administered 2018-07-12: 1 via ORAL
  Filled 2018-07-11: qty 1

## 2018-07-11 MED ORDER — OXYCODONE-ACETAMINOPHEN 5-325 MG PO TABS
1.0000 | ORAL_TABLET | Freq: Four times a day (QID) | ORAL | Status: DC | PRN
  Administered 2018-07-11 – 2018-07-12 (×2): 1 via ORAL
  Filled 2018-07-11 (×2): qty 1

## 2018-07-11 MED ORDER — ACETAMINOPHEN 325 MG PO TABS: 650.0000 mg | ORAL_TABLET | ORAL | Status: DC | PRN

## 2018-07-11 MED ORDER — LAMOTRIGINE 100 MG PO TABS
200.0000 mg | ORAL_TABLET | Freq: Every day | ORAL | Status: DC
  Administered 2018-07-11: 22:00:00 200 mg via ORAL
  Filled 2018-07-11: qty 2

## 2018-07-11 MED ORDER — HYDROXYZINE HCL 10 MG PO TABS
10.0000 mg | ORAL_TABLET | Freq: Three times a day (TID) | ORAL | Status: DC | PRN
  Administered 2018-07-12: 21:00:00 10 mg via ORAL
  Filled 2018-07-11 (×2): qty 1

## 2018-07-11 MED ORDER — ASPIRIN EC 325 MG PO TBEC
325.0000 mg | DELAYED_RELEASE_TABLET | Freq: Every day | ORAL | Status: DC
  Administered 2018-07-11 – 2018-07-13 (×2): 325 mg via ORAL
  Filled 2018-07-11 (×3): qty 1

## 2018-07-11 MED ORDER — ACETAMINOPHEN 650 MG RE SUPP: 650.0000 mg | RECTAL | Status: DC | PRN

## 2018-07-11 MED ORDER — PANTOPRAZOLE SODIUM 40 MG PO TBEC
40.0000 mg | DELAYED_RELEASE_TABLET | Freq: Every day | ORAL | Status: DC
  Administered 2018-07-12 – 2018-07-13 (×2): 40 mg via ORAL
  Filled 2018-07-11 (×2): qty 1

## 2018-07-11 MED ORDER — LURASIDONE HCL 40 MG PO TABS
60.0000 mg | ORAL_TABLET | Freq: Every day | ORAL | Status: DC
  Filled 2018-07-11: qty 2

## 2018-07-11 MED ORDER — ONDANSETRON HCL 4 MG/2ML IJ SOLN: 4.0000 mg | Freq: Four times a day (QID) | INTRAMUSCULAR | Status: DC | PRN

## 2018-07-11 MED ORDER — CITALOPRAM HYDROBROMIDE 20 MG PO TABS
20.0000 mg | ORAL_TABLET | Freq: Every day | ORAL | Status: DC
  Administered 2018-07-12 – 2018-07-13 (×2): 20 mg via ORAL
  Filled 2018-07-11 (×2): qty 1

## 2018-07-11 MED ORDER — ENOXAPARIN SODIUM 40 MG/0.4ML ~~LOC~~ SOLN
40.0000 mg | SUBCUTANEOUS | Status: DC
  Administered 2018-07-11 – 2018-07-12 (×2): 40 mg via SUBCUTANEOUS
  Filled 2018-07-11 (×2): qty 0.4

## 2018-07-11 MED ORDER — MONTELUKAST SODIUM 10 MG PO TABS
10.0000 mg | ORAL_TABLET | Freq: Every day | ORAL | Status: DC
  Administered 2018-07-11 – 2018-07-12 (×2): 10 mg via ORAL
  Filled 2018-07-11 (×2): qty 1

## 2018-07-11 MED ORDER — ACETAMINOPHEN 160 MG/5ML PO SOLN
650.0000 mg | ORAL | Status: DC | PRN
  Filled 2018-07-11: qty 20.3

## 2018-07-11 MED ORDER — PRAVASTATIN SODIUM 20 MG PO TABS
40.0000 mg | ORAL_TABLET | Freq: Every day | ORAL | Status: DC
  Administered 2018-07-11: 40 mg via ORAL
  Filled 2018-07-11: qty 2

## 2018-07-11 MED ORDER — STROKE: EARLY STAGES OF RECOVERY BOOK
Freq: Once | Status: AC
  Administered 2018-07-11: 19:00:00

## 2018-07-11 MED ORDER — SODIUM CHLORIDE 0.9 % IV SOLN
INTRAVENOUS | Status: DC
  Administered 2018-07-11 – 2018-07-13 (×3): via INTRAVENOUS

## 2018-07-11 NOTE — ED Triage Notes (Signed)
Patient presents to the ED via EMS from home.  Patient states, "I have not been able to walk all weekend, I've been on the floor and had to crawl."  Patient is complaining of head and neck pain x 2 days, patient is unsure of whether she has fallen or had seizures.  Patient states she has not been able to eat over the weekend. Patient states she lives by herself.  Patient has nasal congestion as well.

## 2018-07-11 NOTE — H&P (Signed)
[facility] - [place] [facility]   PATIENT NAME: Kelly [name]    MR#:  [id number]  DATE OF BIRTH:  06-01-1966  DATE OF ADMISSION:  07/11/2018  PRIMARY CARE PHYSICIAN: System, Pcp Not In   REQUESTING/REFERRING PHYSICIAN: Nita SickleVeronese, [place], MD  CHIEF COMPLAINT:   Chief Complaint  Patient presents with  . Weakness  . Headache   Left-sided weakness and headache for 3 days. HISTORY OF PRESENT ILLNESS:  Kelly [name]  is a 52 y.o. female with a known history of multiple medical problems as below.  The patient presents the ED with above chief complaints.  She has a history of seizure disorder, has been on seizure medication but she has seizure on weekly basis.  For the past 3 days.  The patient did feel left side weakness, headache and fall several times.  She has no history of stroke.  She is followed by Dr. Malvin JohnsPotter.  She denies any dysphagia, slurred speech or incontinence.  ED physician request admission for possible stroke.  CT of the head is unremarkable.  PAST MEDICAL HISTORY:   Past Medical History:  Diagnosis Date  . Acid reflux   . Anxiety   . Hypercholesteremia   . Myocardial infarction (HCC)   . Schizophrenia (HCC)   . Seizures (HCC)     PAST SURGICAL HISTORY:   Past Surgical History:  Procedure Laterality Date  . CARDIAC CATHETERIZATION    . CESAREAN SECTION    . DILATION AND CURETTAGE OF UTERUS    . FOOT SURGERY    . TONSILLECTOMY      SOCIAL HISTORY:   Social History   Tobacco Use  . Smoking status: Former Games developermoker  . Smokeless tobacco: Former Engineer, waterUser  Substance Use Topics  . Alcohol use: Yes    Comment: rare    FAMILY HISTORY:   Family History  Problem Relation Age of Onset  . Breast cancer Mother   . Diabetes Maternal Aunt   . Ovarian cancer Neg Hx   . Colon cancer Neg Hx     DRUG ALLERGIES:  No Known Allergies  REVIEW OF SYSTEMS:   Review of Systems  Constitutional: Negative for chills, fever and malaise/fatigue.    HENT: Negative for sore throat.   Eyes: Negative for blurred vision and double vision.  Respiratory: Negative for cough, hemoptysis, shortness of breath, wheezing and stridor.   Cardiovascular: Negative for chest pain, palpitations, orthopnea and leg swelling.  Gastrointestinal: Negative for abdominal pain, blood in stool, diarrhea, melena, nausea and vomiting.  Genitourinary: Negative for dysuria, flank pain and hematuria.  Musculoskeletal: Negative for back pain and joint pain.  Skin: Negative for rash.  Neurological: Positive for focal weakness and headaches. Negative for dizziness, tingling, tremors, sensory change, seizures, loss of consciousness and weakness.  Endo/Heme/Allergies: Negative for polydipsia.  Psychiatric/Behavioral: Negative for depression. The patient is not nervous/anxious.     MEDICATIONS AT HOME:   Prior to Admission medications   Medication Sig Start Date End Date Taking? Authorizing Provider  Cholecalciferol (VITAMIN D3) 50000 units CAPS TAKE ONE CAPSULE BY MOUTH ONE TIME PER WEEK 01/05/18  Yes [provider]  citalopram (CELEXA) 20 MG tablet Take 20 mg by mouth daily. 06/14/18  Yes [provider]  esomeprazole (NEXIUM) 40 MG capsule Take 40 mg by mouth daily.  12/22/17  Yes [provider]  lamoTRIgine (LAMICTAL) 200 MG tablet Take 200 mg by mouth at bedtime.  02/11/16  Yes [provider]  LATUDA 60 MG  TABS Take 1 tablet by mouth daily. 05/06/18  Yes [provider]  montelukast (SINGULAIR) 10 MG tablet Take 10 mg by mouth at bedtime.  05/04/18  Yes [provider]  pravastatin (PRAVACHOL) 20 MG tablet TAKE 1 TABLET BY MOUTH EVERYDAY AT BEDTIME 01/05/18  Yes [provider]  propranolol ER (INDERAL LA) 60 MG 24 hr capsule TAKE 1 CAPSULE BY MOUTH EVERYDAY AT BEDTIME 12/16/17  Yes [provider]  VIMPAT 200 MG TABS tablet Take 200 mg by mouth 2 (two) times daily.  02/12/16  Yes [provider]  hydrOXYzine (ATARAX/VISTARIL) 10 MG tablet Take 10 mg by mouth 3 (three) times daily as needed. for anxiety 12/16/17   [provider]      VITAL SIGNS:  Blood pressure 136/75, pulse 78, temperature 98.4 F (36.9 C), temperature source Oral, resp. rate 16, height 5\' 6"  (1.676 m), weight 93.4 kg, last menstrual period 07/11/2018, SpO2 98 %.  PHYSICAL EXAMINATION:  Physical Exam  GENERAL:  52 y.o.-year-old patient lying in the bed with no acute distress.  Obesity. EYES: Pupils equal, round, reactive to light and accommodation. No scleral icterus. Extraocular muscles intact.  HEENT: Head atraumatic, normocephalic. Oropharynx and nasopharynx clear.  NECK:  Supple, no jugular venous distention. No thyroid enlargement, no tenderness.  LUNGS: Normal breath sounds bilaterally, no wheezing, rales,rhonchi or crepitation. No use of accessory muscles of respiration.  CARDIOVASCULAR: S1, S2 normal. No murmurs, rubs, or gallops.  ABDOMEN: Soft, nontender, nondistended. Bowel sounds present. No organomegaly or mass.  EXTREMITIES: No pedal edema, cyanosis, or clubbing.  NEUROLOGIC: Cranial nerves II through XII are intact. Muscle strength 5/5 in right extremities except 4/5 in left upper and lower extremities. Sensation intact. Gait not checked.  PSYCHIATRIC: The patient is alert and oriented x 3.  SKIN: No obvious rash, lesion, or ulcer.   LABORATORY PANEL:   CBC Recent Labs  Lab 07/11/18 1218  WBC 10.9  HGB 12.6  HCT 37.8  PLT 253   ------------------------------------------------------------------------------------------------------------------  Chemistries  Recent Labs  Lab 07/11/18 1218  NA 138  K 4.1  CL 106  CO2 25  GLUCOSE 99  BUN 15  CREATININE 0.71  CALCIUM 8.8*   ------------------------------------------------------------------------------------------------------------------  Cardiac Enzymes Recent Labs  Lab 07/11/18 1255  TROPONINI <0.03    ------------------------------------------------------------------------------------------------------------------  RADIOLOGY:  Ct Head Wo Contrast  Result Date: 07/11/2018 CLINICAL DATA:  Head and posterior neck pain 2 days after seizures, weakness, history MI, former smoker EXAM: CT HEAD WITHOUT CONTRAST CT CERVICAL SPINE WITHOUT CONTRAST TECHNIQUE: Multidetector CT imaging of the head and cervical spine was performed following the standard protocol without intravenous contrast. Multiplanar CT image reconstructions of the cervical spine were also generated. COMPARISON:  None; correlation MR brain 02/21/2018 FINDINGS: CT HEAD FINDINGS Brain: Normal ventricular morphology. No midline shift or mass effect. Normal appearance of brain parenchyma. No intracranial hemorrhage, mass lesion or evidence of acute infarction. No extra-axial fluid collections. At Vascular: No hyperdense vessels Skull: Intact Sinuses/Orbits: Clear Other: N/A CT CERVICAL SPINE FINDINGS Alignment: Normal Skull base and vertebrae: Osseous mineralization normal. Visualized skull base intact. Vertebral body heights maintained. Disc space narrowing and endplate spur formation at C4-C5 and C5-C6. No fracture, subluxation, or bone destruction. Soft tissues and spinal canal: Prevertebral soft tissues normal thickness. Disc levels:  Otherwise unremarkable Upper chest: Lung apices clear Other: N/A IMPRESSION: No acute intracranial abnormalities. 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 No acute cervical spine abnormalities. Electronically Signed   By: Loraine Leriche  Tyron Russell M.D.   On: 07/11/2018 15:52   Ct Cervical Spine Wo Contrast  Result Date: 07/11/2018 CLINICAL DATA:  Head and posterior neck pain 2 days after seizures, weakness, history MI, former smoker EXAM: CT HEAD WITHOUT CONTRAST CT CERVICAL SPINE WITHOUT CONTRAST TECHNIQUE: Multidetector CT imaging of the head and cervical spine was performed following the standard protocol  without intravenous contrast. Multiplanar CT image reconstructions of the cervical spine were also generated. COMPARISON:  None; correlation MR brain 02/21/2018 FINDINGS: CT HEAD FINDINGS Brain: Normal ventricular morphology. No midline shift or mass effect. Normal appearance of brain parenchyma. No intracranial hemorrhage, mass lesion or evidence of acute infarction. No extra-axial fluid collections. At Vascular: No hyperdense vessels Skull: Intact Sinuses/Orbits: Clear Other: N/A CT CERVICAL SPINE FINDINGS Alignment: Normal Skull base and vertebrae: Osseous mineralization normal. Visualized skull base intact. Vertebral body heights maintained. Disc space narrowing and endplate spur formation at C4-C5 and C5-C6. No fracture, subluxation, or bone destruction. Soft tissues and spinal canal: Prevertebral soft tissues normal thickness. Disc levels:  Otherwise unremarkable Upper chest: Lung apices clear Other: N/A IMPRESSION: No acute intracranial abnormalities. 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 No acute cervical spine abnormalities. Electronically Signed   By: Ulyses Southward M.D.   On: 07/11/2018 15:52      IMPRESSION AND PLAN:   Left-sided weakness and fall, rule out CVA. The patient will be late for observation. Start aspirin, continue statin, neurochecks, MRI and MRA of the brain.  Echocardiograph and carotid duplex.  Neurology consult.  Seizure disorder.  Continue home medication, seizure precaution.   All the records are reviewed and case discussed with ED provider. Management plans discussed with the patient, family and they are in agreement.  CODE STATUS: Full code.  TOTAL TIME TAKING CARE OF THIS PATIENT: 38 minutes.    Shaune Pollack M.D on 07/11/2018 at 5:22 PM  Between 7am to 6pm - Pager - [phone]  After 6pm go to [email or web address] - Social research officer, government  [facility] [place] [facility]  Office  [phone]  CC: Primary care physician; System, Pcp Not  In   Note: This dictation was prepared with Dragon dictation along with smaller phrase technology. Any transcriptional errors that result from this process are unin

## 2018-07-11 NOTE — ED Provider Notes (Signed)
[facility] Emergency Department Provider Note  ____________________________________________  Time seen: Approximately 4:23 PM  I have reviewed the triage vital signs and the nursing notes.   HISTORY  Chief Complaint Weakness and Headache   HPI Kelly [name] is a 52 y.o. female with a history of seizure disorder, schizophrenia who presents from home for weakness and several falls.  Patient reports that over the last 3 days she has had bilateral lower extremity weakness, her legs are giving out and she has had several falls.  She reports that she has been crawling in her house, has been unable to stand up for the last 3 days.  She reports that the weakness is worse on the left lower extremity and constant.  She denies any prior history of stroke.  She reports having had several seizures over the last month.  She is followed by Dr. Malvin JohnsPotter who has been adjusting her medications.  She reports that she believes her last seizure was a week ago when she woke up on the floor and had had urinary incontinence.  She is complaining of diffuse headache and neck pain which is chronic for her but she reports that the pain is worse for the last 3 days which she thinks might be from 1 of her falls.  She denies dysuria or hematuria, chest pain or shortness of breath, abdominal pain, nausea, vomiting, diarrhea.  Patient reports that she has no family.  She is unable to leave her house due to her weakness.  Past Medical History:  Diagnosis Date  . Acid reflux   . Anxiety   . Hypercholesteremia   . Myocardial infarction (HCC)   . Schizophrenia (HCC)   . Seizures [facility](HCC)     Patient Active Problem List   Diagnosis Date Noted  . Schizoaffective disorder (HCC) 04/08/2016  . Acute anxiety 04/08/2016    Past Surgical History:  Procedure Laterality Date  . CARDIAC CATHETERIZATION    . CESAREAN SECTION    . DILATION AND CURETTAGE OF UTERUS    . FOOT SURGERY    . TONSILLECTOMY       Prior to Admission medications   Medication Sig Start Date End Date Taking? Authorizing Provider  Cholecalciferol (VITAMIN D3) 50000 units CAPS TAKE ONE CAPSULE BY MOUTH ONE TIME PER WEEK 01/05/18  Yes [provider]  citalopram (CELEXA) 20 MG tablet Take 20 mg by mouth daily. 06/14/18  Yes [provider]  esomeprazole (NEXIUM) 40 MG capsule Take 40 mg by mouth daily.  12/22/17  Yes [provider]  lamoTRIgine (LAMICTAL) 200 MG tablet Take 200 mg by mouth at bedtime.  02/11/16  Yes [provider]  LATUDA 60 MG TABS Take 1 tablet by mouth daily. 05/06/18  Yes [provider]  montelukast (SINGULAIR) 10 MG tablet Take 10 mg by mouth at bedtime.  05/04/18  Yes [provider]  pravastatin (PRAVACHOL) 20 MG tablet TAKE 1 TABLET BY MOUTH EVERYDAY AT BEDTIME 01/05/18  Yes [provider]  propranolol ER (INDERAL LA) 60 MG 24 hr capsule TAKE 1 CAPSULE BY MOUTH EVERYDAY AT BEDTIME 12/16/17  Yes [provider]  VIMPAT 200 MG TABS tablet Take 200 mg by mouth 2 (two) times daily.  02/12/16  Yes [provider]  hydrOXYzine (ATARAX/VISTARIL) 10 MG tablet Take 10 mg by mouth 3 (three) times daily as needed. for anxiety 12/16/17   [provider]    Allergies Patient has no known allergies.  Family History  Problem Relation Age of Onset  . Breast cancer Mother   . Diabetes Maternal Aunt   . Ovarian cancer Neg Hx   . Colon cancer Neg Hx     Social History Social History   Tobacco Use  . Smoking status: Former Games developer  . Smokeless tobacco: Former Engineer, water Use Topics  . Alcohol use: Yes    Comment: rare  . Drug use: Yes    Types: Marijuana    Comment: ocas    Review of Systems  Constitutional: Negative for fever. Eyes: Negative for visual changes. ENT: Negative for sore throat. Neck: No neck pain  Cardiovascular: Negative for chest pain. Respiratory: Negative for shortness of  breath. Gastrointestinal: Negative for abdominal pain, vomiting or diarrhea. Genitourinary: Negative for dysuria. Musculoskeletal: Negative for back pain. Skin: Negative for rash. Neurological: + headaches and b/l LE weakness Psych: No SI or HI  ____________________________________________   PHYSICAL EXAM:  VITAL SIGNS: ED Triage Vitals  Enc Vitals Group     BP 07/11/18 1212 132/71     Pulse Rate 07/11/18 1212 79     Resp 07/11/18 1212 20     Temp 07/11/18 1212 98.4 F (36.9 C)     Temp Source 07/11/18 1212 Oral     SpO2 07/11/18 1212 95 %     Weight 07/11/18 1233 206 lb (93.4 kg)     Height 07/11/18 1233 5\' 6"  (1.676 m)     Head Circumference --      Peak Flow --      Pain Score 07/11/18 1233 9     Pain Loc --      Pain Edu? --      Excl. in GC? --     Constitutional: Alert and oriented. No acute distress. Does not appear intoxicated. HEENT Head: Normocephalic and atraumatic. Face: No facial bony tenderness. Stable midface Ears: No hemotympanum bilaterally. No Battle sign Eyes: No eye injury. PERRL. No raccoon eyes Nose: Nontender. No epistaxis. No rhinorrhea Mouth/Throat: Mucous membranes are moist. No oropharyngeal blood. No dental injury. Airway patent without stridor. Normal voice. Neck: no C-collar in place. No midline c-spine tenderness. Diffuse paraspinal tenderness Cardiovascular: Normal rate, regular rhythm. Normal and symmetric distal pulses are present in all extremities. Pulmonary/Chest: Chest wall is stable and nontender to palpation/compression. Normal respiratory effort. Breath sounds are normal. No crepitus.  Abdominal: Soft, nontender, non distended. Musculoskeletal: Nontender with normal full range of motion in all extremities. No deformities. No thoracic or lumbar midline spinal tenderness. Pelvis is stable. Skin: Skin is warm, dry and intact. No abrasions or contutions. Psychiatric: Speech and behavior are appropriate. Neurological: Normal speech  and language. A & O x3, PERRL, EOMI, no nystagmus, CN II-XII intact, motor testing reveals good tone and bulk throughout. There is no evidence of pronator drift or dysmetria. 3/5 strength on LU and LLE, 5/5 on RU and RLE. Sensory examination is intact. Gait deferred    Glascow Coma Score: 4 - Opens eyes on own 6 - Follows simple motor commands 5 - Alert and oriented GCS: 15  ____________________________________________   LABS (all labs ordered are listed, but only abnormal results are displayed)  Labs Reviewed  BASIC METABOLIC PANEL - Abnormal; Notable for the following components:      Result Value   Calcium 8.8 (*)    All other components within normal limits  CBC - Abnormal; Notable for the following components:   RDW 16.4 (*)    All other components within normal  limits  TROPONIN I  URINALYSIS, COMPLETE (UACMP) WITH MICROSCOPIC  CK  LAMOTRIGINE LEVEL  AMMONIA  CBG MONITORING, ED   ____________________________________________  EKG  ED ECG REPORT I, Nita Sickle, the attending physician, personally viewed and interpreted this ECG.  Normal sinus rhythm, rate of 73, normal intervals, normal axis, no ST elevations or depressions. ____________________________________________  RADIOLOGY  I have personally reviewed the images performed during this visit and I agree with the Radiologist's read.   Interpretation by Radiologist:  Ct Head Wo Contrast  Result Date: 07/11/2018 CLINICAL DATA:  Head and posterior neck pain 2 days after seizures, weakness, history MI, former smoker EXAM: CT HEAD WITHOUT CONTRAST CT CERVICAL SPINE WITHOUT CONTRAST TECHNIQUE: Multidetector CT imaging of the head and cervical spine was performed following the standard protocol without intravenous contrast. Multiplanar CT image reconstructions of the cervical spine were also generated. COMPARISON:  None; correlation MR brain 02/21/2018 FINDINGS: CT HEAD FINDINGS Brain: Normal ventricular  morphology. No midline shift or mass effect. Normal appearance of brain parenchyma. No intracranial hemorrhage, mass lesion or evidence of acute infarction. No extra-axial fluid collections. At Vascular: No hyperdense vessels Skull: Intact Sinuses/Orbits: Clear Other: N/A CT CERVICAL SPINE FINDINGS Alignment: Normal Skull base and vertebrae: Osseous mineralization normal. Visualized skull base intact. Vertebral body heights maintained. Disc space narrowing and endplate spur formation at C4-C5 and C5-C6. No fracture, subluxation, or bone destruction. Soft tissues and spinal canal: Prevertebral soft tissues normal thickness. Disc levels:  Otherwise unremarkable Upper chest: Lung apices clear Other: N/A IMPRESSION: No acute intracranial abnormalities. 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 No acute cervical spine abnormalities. Electronically Signed   By: Ulyses Southward M.D.   On: 07/11/2018 15:52   Ct Cervical Spine Wo Contrast  Result Date: 07/11/2018 CLINICAL DATA:  Head and posterior neck pain 2 days after seizures, weakness, history MI, former smoker EXAM: CT HEAD WITHOUT CONTRAST CT CERVICAL SPINE WITHOUT CONTRAST TECHNIQUE: Multidetector CT imaging of the head and cervical spine was performed following the standard protocol without intravenous contrast. Multiplanar CT image reconstructions of the cervical spine were also generated. COMPARISON:  None; correlation MR brain 02/21/2018 FINDINGS: CT HEAD FINDINGS Brain: Normal ventricular morphology. No midline shift or mass effect. Normal appearance of brain parenchyma. No intracranial hemorrhage, mass lesion or evidence of acute infarction. No extra-axial fluid collections. At Vascular: No hyperdense vessels Skull: Intact Sinuses/Orbits: Clear Other: N/A CT CERVICAL SPINE FINDINGS Alignment: Normal Skull base and vertebrae: Osseous mineralization normal. Visualized skull base intact. Vertebral body heights maintained. Disc space narrowing and  endplate spur formation at C4-C5 and C5-C6. No fracture, subluxation, or bone destruction. Soft tissues and spinal canal: Prevertebral soft tissues normal thickness. Disc levels:  Otherwise unremarkable Upper chest: Lung apices clear Other: N/A IMPRESSION: No acute intracranial abnormalities. 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 No acute cervical spine abnormalities. Electronically Signed   By: Ulyses Southward M.D.   On: 07/11/2018 15:52    ____________________________________________   PROCEDURES  Procedure(s) performed: None Procedures Critical Care performed:  None ____________________________________________   INITIAL IMPRESSION / ASSESSMENT AND PLAN / ED COURSE   52 y.o. female with a history of seizure disorder, schizophrenia who presents from home for weakness and several falls.  Patient has 3 out of 5 strength on left upper and lower extremities concerning for stroke versus Todd's paralysis.  She has had an EEG done recently by Dr. Malvin Johns showing signs of epilepsy.  She reports compliance with her antiepileptic medications other  than missing her dose yesterday and today.  Patient is unsafe at home, has no help, unable to leave the house due to her weakness, with several recent falls.  CT head and neck with no acute findings.  Vital signs are within normal limits. Labs with no acute findings. Will discuss with Hospitalist for admission for stroke evaluation.       As part of my medical decision making, I reviewed the following data within the electronic MEDICAL RECORD NUMBER Nursing notes reviewed and incorporated, Labs reviewed , EKG interpreted , Old EKG reviewed, Old chart reviewed, Radiograph reviewed , Discussed with admitting physician , Notes from prior ED visits and [place] Controlled Substance Database    Pertinent labs & imaging results that were available during my care of the patient were reviewed by me and considered in my medical decision making (see chart for  details).    ____________________________________________   FINAL CLINICAL IMPRESSION(S) / ED DIAGNOSES  Final diagnoses:  Left-sided weakness  Multiple falls      NEW MEDICATIONS STARTED DURING THIS VISIT:  ED Discharge Orders    None       Note:  This document was prepared using Dragon voice recognition software and may include unintentional dictation errors.    Nita Sickle, MD 07/11/18 [phone]

## 2018-07-12 ENCOUNTER — Observation Stay
Admit: 2018-07-12 | Discharge: 2018-07-12 | Disposition: A | Discharge status: Home / Self Care | Payer: Medicare Other | Attending: Internal Medicine | Admitting: Internal Medicine

## 2018-07-12 ENCOUNTER — Observation Stay: Payer: Medicare Other

## 2018-07-12 DIAGNOSIS — R296 Repeated falls: Secondary | ICD-10-CM

## 2018-07-12 LAB — LIPID PANEL
CHOL/HDL RATIO: 4.6 ratio
CHOLESTEROL: 219 mg/dL — AB (ref 0–200)
HDL: 48 mg/dL (ref >40)
LDL CALC: 155 mg/dL — AB (ref 0–99)
Triglycerides: 82 mg/dL (ref <150)
VLDL: 16 mg/dL (ref 0–40)

## 2018-07-12 LAB — ECHOCARDIOGRAM COMPLETE
Height: 65 in
WEIGHTICAEL: 3252.23 [oz_av]

## 2018-07-12 LAB — HEMOGLOBIN A1C
Hgb A1c MFr Bld: 5.1 % (ref 4.8–5.6)
Mean Plasma Glucose: 99.67 mg/dL

## 2018-07-12 MED ORDER — HYDROCODONE-ACETAMINOPHEN 5-325 MG PO TABS
1.0000 | ORAL_TABLET | ORAL | Status: DC | PRN
  Administered 2018-07-12 – 2018-07-13 (×3): 1 via ORAL
  Filled 2018-07-12 (×3): qty 1

## 2018-07-12 MED ORDER — LAMOTRIGINE 100 MG PO TABS
200.0000 mg | ORAL_TABLET | Freq: Every day | ORAL | Status: DC
  Administered 2018-07-12: 200 mg via ORAL
  Filled 2018-07-12: qty 2

## 2018-07-12 MED ORDER — ATORVASTATIN CALCIUM 20 MG PO TABS
40.0000 mg | ORAL_TABLET | Freq: Every day | ORAL | Status: DC
  Administered 2018-07-12: 17:00:00 40 mg via ORAL
  Filled 2018-07-12: qty 2

## 2018-07-12 MED ORDER — LAMOTRIGINE 100 MG PO TABS: 250.0000 mg | ORAL_TABLET | Freq: Every day | ORAL | Status: DC

## 2018-07-12 NOTE — Evaluation (Signed)
Physical Therapy Evaluation Patient Details Name: Kelly [name] MRN: [id number] DOB: 06-Apr-1966 Today's Date: 07/12/2018   History of Present Illness  52 y.o. female with history of seizure disorder, schizophrenia and anxiety disorder.   She presented to the ED on 07/11/2018 with complaints of progressive bilateral leg weakness causing her multiple falls (states she has been crawling around her home most of the last few days). She state that her legs (especially left) are giving out on her causing her to fall. She feels that this has been worse over the last 3-5 days. She describes associated symptoms of vision blurriness, imbalance and inability to walk.  Imaging reveals no acute (only chronic) CVA.  Clinical Impression  Pt eager to work with PT and able to do some limited walking but is not at her normal near independent baseline.  She was able to ambulate ~40 ft with a walker but was unsteady and lacked confidence.  She reports that she has frequent and chronic seizure episodes with associated short term L sided weakness, but that L U&LE issues are more prolonged than normal this time.  She has some assist from friends and lives in an accessible apartment, she will need HHPT to work on gait, strength, balance and coordination to get back to a more baseline level.    Follow Up Recommendations Home health PT    Equipment Recommendations  Rolling walker with 5" wheels    Recommendations for Other Services       Precautions / Restrictions Precautions Precautions: Fall Restrictions Weight Bearing Restrictions: No      Mobility  Bed Mobility Overal bed mobility: Independent             General bed mobility comments: Pt able to get to sitting EOB w/o use of bedrail with flat HOB  Transfers Overall transfer level: Modified independent Equipment used: Rolling walker (2 wheeled)             General transfer comment: utilized B UEs to maintain static stand with  FWW  Ambulation/Gait Ambulation/Gait assistance: Min guard Gait Distance (Feet): 40 Feet Assistive device: Rolling walker (2 wheeled)       General Gait Details: Pt is able to ambulate with walker and no direct assist, but had increased L hip flexion w/o overt toe drop.  Pt showed good effort but fatigued quickly.  Stairs            Wheelchair Mobility    Modified Rankin (Stroke Patients Only)       Balance Overall balance assessment: Needs assistance Sitting-balance support: No upper extremity supported Sitting balance-Leahy Scale: Good     Standing balance support: Bilateral upper extremity supported Standing balance-Leahy Scale: Fair Standing balance comment: reliant on walker, no LOBs with UE assist                             Pertinent Vitals/Pain Pain Assessment: No/denies pain    Home Living Family/patient expects to be discharged to:: Private residence Living Arrangements: Alone Available Help at Discharge: Friend(s);Available PRN/intermittently Type of Home: Apartment Home Access: Ramped entrance     Home Layout: One level Home Equipment: Grab bars - tub/shower Additional Comments: Pt has emergency pull cords in bathroom and bed room    Prior Function Level of Independence: Independent         Comments: Pt reports she walks with no AD in home and in community, has friend that takes her to grocery  store and to pay bills 1x/month, has medicare transportation to doctor appointments. Pt reports performs all ADLs and home IADLs independently.      Hand Dominance        Extremity/Trunk Assessment   Upper Extremity Assessment Upper Extremity Assessment: RUE deficits/detail;LUE deficits/detail RUE Deficits / Details: 4/5 shld/elbow flex/ext and abd/add, hand grip 4/5 LUE Deficits / Details: 3-/5 shld/elbow flex/ext and abd/add, hand grip 3+/5    Lower Extremity Assessment Lower Extremity Assessment: Defer to PT evaluation        Communication   Communication: No difficulties  Cognition Arousal/Alertness: Awake/alert Behavior During Therapy: WFL for tasks assessed/performed Overall Cognitive Status: Within Functional Limits for tasks assessed                                        General Comments      Exercises Other Exercises Other Exercises: Pt educated on modified technique when bending over seated or standing to assist with maintaining standing balance-to not turn/twist while bent over d/t balance loss Other Exercises: Pt edicated on safe hand placement with use of FWW for sit<>stand   Assessment/Plan    PT Assessment Patient needs continued PT services  PT Problem List Decreased strength;Decreased range of motion;Decreased activity tolerance;Decreased coordination;Decreased mobility;Decreased balance;Decreased safety awareness;Decreased knowledge of use of DME;Decreased knowledge of precautions       PT Treatment Interventions DME instruction;Gait training;Functional mobility training;Therapeutic activities;Therapeutic exercise;Balance training;Neuromuscular re-education;Patient/family education    PT Goals (Current goals can be found in the Care Plan section)  Acute Rehab PT Goals Patient Stated Goal: go home PT Goal Formulation: With patient Time For Goal Achievement: 07/26/18 Potential to Achieve Goals: Good    Frequency Min 2X/week   Barriers to discharge        Co-evaluation               AM-PAC PT "6 Clicks" Daily Activity  Outcome Measure Difficulty turning over in bed (including adjusting bedclothes, sheets and blankets)?: None Difficulty moving from lying on back to sitting on the side of the bed? : None Difficulty sitting down on and standing up from a chair with arms (e.g., wheelchair, bedside commode, etc,.)?: A Little Help needed moving to and from a bed to chair (including a wheelchair)?: None Help needed walking in hospital room?: A Little Help needed  climbing 3-5 steps with a railing? : A Little 6 Click Score: 21    End of Session Equipment Utilized During Treatment: Gait belt Activity Tolerance: Patient limited by fatigue Patient left: with bed alarm set;with call bell/phone within reach   PT Visit Diagnosis: Muscle weakness (generalized) (M62.81);Difficulty in walking, not elsewhere classified (R26.2)    Time: 8295-6213 PT Time Calculation (min) (ACUTE ONLY): 24 min   Charges:   PT Evaluation $PT Eval Low Complexity: 1 Low PT Treatments $Gait Training: 8-22 mins        Malachi Pro, DPT 07/12/2018, 4:21 PM

## 2018-07-12 NOTE — Progress Notes (Signed)
[facility] [place] [facility]   PATIENT NAME: Kelly [name]    MR#:  [id number]  DATE OF BIRTH:  1966-01-08  SUBJECTIVE:  CHIEF COMPLAINT:   Chief Complaint  Patient presents with  . Weakness  . Headache   - still has left sided weakness and heaviness feeling - MRI negative  REVIEW OF SYSTEMS:  Review of Systems  Constitutional: Negative for chills, fever and malaise/fatigue.  HENT: Negative for congestion, ear discharge, hearing loss and nosebleeds.   Eyes: Negative for blurred vision and double vision.  Respiratory: Negative for cough, shortness of breath and wheezing.   Cardiovascular: Negative for chest pain, palpitations and leg swelling.  Gastrointestinal: Negative for abdominal pain, constipation, diarrhea, nausea and vomiting.  Genitourinary: Negative for dysuria.  Musculoskeletal: Negative for myalgias.  Neurological: Positive for sensory change and focal weakness. Negative for dizziness, seizures and headaches.  Psychiatric/Behavioral: Negative for depression.    DRUG ALLERGIES:  No Known Allergies  VITALS:  Blood pressure 124/66, pulse 64, temperature 98 F (36.7 C), temperature source Oral, resp. rate 16, height 5\' 5"  (1.651 m), weight 92.2 kg, last menstrual period 07/11/2018, SpO2 97 %.  PHYSICAL EXAMINATION:  Physical Exam   GENERAL:  52 y.o.-year-old patient lying in the bed with no acute distress.  EYES: Pupils equal, round, reactive to light and accommodation. No scleral icterus. Extraocular muscles intact.  HEENT: Head atraumatic, normocephalic. Oropharynx and nasopharynx clear.  NECK:  Supple, no jugular venous distention. No thyroid enlargement, no tenderness.  LUNGS: Normal breath sounds bilaterally, no wheezing, rales,rhonchi or crepitation. No use of accessory muscles of respiration. Decreased bibasilar breath sounds CARDIOVASCULAR: S1, S2 normal. No murmurs, rubs, or gallops.  ABDOMEN: Soft, nontender, nondistended.  Bowel sounds present. No organomegaly or mass.  EXTREMITIES: No pedal edema, cyanosis, or clubbing.  NEUROLOGIC: Cranial nerves II through XII are intact. Muscle strength 5/5 in all extremities. Left sided drift noted. Sensation intact. Gait not checked.  PSYCHIATRIC: The patient is alert and oriented x 3.  SKIN: No obvious rash, lesion, or ulcer.    LABORATORY PANEL:   CBC Recent Labs  Lab 07/11/18 1218  WBC 10.9  HGB 12.6  HCT 37.8  PLT 253   ------------------------------------------------------------------------------------------------------------------  Chemistries  Recent Labs  Lab 07/11/18 1218  NA 138  K 4.1  CL 106  CO2 25  GLUCOSE 99  BUN 15  CREATININE 0.71  CALCIUM 8.8*   ------------------------------------------------------------------------------------------------------------------  Cardiac Enzymes Recent Labs  Lab 07/11/18 1255  TROPONINI <0.03   ------------------------------------------------------------------------------------------------------------------  RADIOLOGY:  Ct Head Wo Contrast  Result Date: 07/11/2018 CLINICAL DATA:  Head and posterior neck pain 2 days after seizures, weakness, history MI, former smoker EXAM: CT HEAD WITHOUT CONTRAST CT CERVICAL SPINE WITHOUT CONTRAST TECHNIQUE: Multidetector CT imaging of the head and cervical spine was performed following the standard protocol without intravenous contrast. Multiplanar CT image reconstructions of the cervical spine were also generated. COMPARISON:  None; correlation MR brain 02/21/2018 FINDINGS: CT HEAD FINDINGS Brain: Normal ventricular morphology. No midline shift or mass effect. Normal appearance of brain parenchyma. No intracranial hemorrhage, mass lesion or evidence of acute infarction. No extra-axial fluid collections. At Vascular: No hyperdense vessels Skull: Intact Sinuses/Orbits: Clear Other: N/A CT CERVICAL SPINE FINDINGS Alignment: Normal Skull base and vertebrae: Osseous  mineralization normal. Visualized skull base intact. Vertebral body heights maintained. Disc space narrowing and endplate spur formation at C4-C5 and C5-C6. No fracture, subluxation, or bone destruction. Soft tissues and spinal canal: Prevertebral soft  tissues normal thickness. Disc levels:  Otherwise unremarkable Upper chest: Lung apices clear Other: N/A IMPRESSION: No acute intracranial abnormalities. 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 No acute cervical spine abnormalities. Electronically Signed   By: Ulyses Southward M.D.   On: 07/11/2018 15:52   Ct Cervical Spine Wo Contrast  Result Date: 07/11/2018 CLINICAL DATA:  Head and posterior neck pain 2 days after seizures, weakness, history MI, former smoker EXAM: CT HEAD WITHOUT CONTRAST CT CERVICAL SPINE WITHOUT CONTRAST TECHNIQUE: Multidetector CT imaging of the head and cervical spine was performed following the standard protocol without intravenous contrast. Multiplanar CT image reconstructions of the cervical spine were also generated. COMPARISON:  None; correlation MR brain 02/21/2018 FINDINGS: CT HEAD FINDINGS Brain: Normal ventricular morphology. No midline shift or mass effect. Normal appearance of brain parenchyma. No intracranial hemorrhage, mass lesion or evidence of acute infarction. No extra-axial fluid collections. At Vascular: No hyperdense vessels Skull: Intact Sinuses/Orbits: Clear Other: N/A CT CERVICAL SPINE FINDINGS Alignment: Normal Skull base and vertebrae: Osseous mineralization normal. Visualized skull base intact. Vertebral body heights maintained. Disc space narrowing and endplate spur formation at C4-C5 and C5-C6. No fracture, subluxation, or bone destruction. Soft tissues and spinal canal: Prevertebral soft tissues normal thickness. Disc levels:  Otherwise unremarkable Upper chest: Lung apices clear Other: N/A IMPRESSION: No acute intracranial abnormalities. 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 No acute  cervical spine abnormalities. Electronically Signed   By: Ulyses Southward M.D.   On: 07/11/2018 15:52   Mr Brain Wo Contrast  Result Date: 07/12/2018 CLINICAL DATA:  52 y/o F; 3 days of bilateral lower extremity weakness and falls. Weakness greater in the left lower extremity. Patient reports several seizures over the last month. EXAM: MRI HEAD WITHOUT CONTRAST MRA HEAD WITHOUT CONTRAST TECHNIQUE: Multiplanar, multiecho pulse sequences of the brain and surrounding structures were obtained without intravenous contrast. Angiographic images of the head were obtained using MRA technique without contrast. COMPARISON:  02/21/2018 MRI head. 07/11/2018 CT head and CT cervical spine. FINDINGS: MRI HEAD FINDINGS Brain: No acute infarction, hemorrhage, hydrocephalus, extra-axial collection or mass lesion. Stable nonspecific focus of T2 FLAIR hyperintense signal abnormality in the left anterior insula of unlikely significance. Optic nerve sheath enlargement and left Meckel's cave larger than right Meckel's cave. Pituitary gland is unremarkable. Vascular: As below. Skull and upper cervical spine: Normal marrow signal. Sinuses/Orbits: Negative. Other: None. MRA HEAD FINDINGS Internal carotid arteries:  Patent. Anterior cerebral arteries:  Patent. Middle cerebral arteries: Patent. Anterior communicating artery: Patent. Posterior communicating arteries: Patent left. No right identified, likely hypoplastic or absent. Posterior cerebral arteries:  Patent. Basilar artery:  Patent. Vertebral arteries:  Patent. No evidence of high-grade stenosis, large vessel occlusion, or aneurysm unless noted above. IMPRESSION: 1. No acute intracranial abnormality. 2. Optic nerve sheath enlargement and left Meckel's cave larger than right Meckel's cave. This combination of findings can be seen with idiopathic intracranial hypertension in the appropriate clinical setting. 3. Normal MRA of the head. Electronically Signed   By: Mitzi Hansen  M.D.   On: 07/12/2018 06:05   US Carotid Bilateral (at [facility] And [facility] Only)  Result Date: 07/12/2018 CLINICAL DATA:  Left-sided weakness, visual disturbance, syncope, coronary artery disease and hyperlipidemia. EXAM: BILATERAL CAROTID DUPLEX ULTRASOUND TECHNIQUE: Wallace Cullens scale imaging, color Doppler and duplex ultrasound were performed of bilateral carotid and vertebral arteries in the neck. COMPARISON:  None. FINDINGS: Criteria: Quantification of carotid stenosis is based on velocity parameters that correlate the residual internal carotid  diameter with NASCET-based stenosis levels, using the diameter of the distal internal carotid lumen as the denominator for stenosis measurement. The following velocity measurements were obtained: RIGHT ICA:  98/21 cm/sec CCA:  104/27 cm/sec SYSTOLIC ICA/CCA RATIO:  0.9 ECA:  89 cm/sec LEFT ICA:  97/31 cm/sec CCA:  78/22 cm/sec SYSTOLIC ICA/CCA RATIO:  1.2 ECA:  105 cm/sec RIGHT CAROTID ARTERY: The common carotid artery demonstrates mild intimal thickening. The ICA demonstrates no focal plaque and normal velocities and waveforms. There is no evidence of ICA stenosis. RIGHT VERTEBRAL ARTERY: Antegrade flow with normal waveform and velocity. LEFT CAROTID ARTERY: The common carotid artery demonstrates mild intimal thickening. There is a mild amount of noncalcified plaque at the level of the carotid bulb. There is limited visualization of the left internal carotid artery due to tortuosity and depth of the vessel. No clear focal plaque or evidence of ICA stenosis. LEFT VERTEBRAL ARTERY: Antegrade flow with normal waveform and velocity. IMPRESSION: Mild plaque at the level of the left carotid bulb. No evidence of right ICA plaque or stenosis. Limited visualization of the left ICA due to tortuosity and depth of the vessel. No focal plaque or evidence of left ICA stenosis in the visualized segment. Electronically Signed   By: Irish Lack M.D.   On: 07/12/2018 09:41   Mr Maxine Glenn Head/brain  ZO Cm  Result Date: 07/12/2018 CLINICAL DATA:  52 y/o F; 3 days of bilateral lower extremity weakness and falls. Weakness greater in the left lower extremity. Patient reports several seizures over the last month. EXAM: MRI HEAD WITHOUT CONTRAST MRA HEAD WITHOUT CONTRAST TECHNIQUE: Multiplanar, multiecho pulse sequences of the brain and surrounding structures were obtained without intravenous contrast. Angiographic images of the head were obtained using MRA technique without contrast. COMPARISON:  02/21/2018 MRI head. 07/11/2018 CT head and CT cervical spine. FINDINGS: MRI HEAD FINDINGS Brain: No acute infarction, hemorrhage, hydrocephalus, extra-axial collection or mass lesion. Stable nonspecific focus of T2 FLAIR hyperintense signal abnormality in the left anterior insula of unlikely significance. Optic nerve sheath enlargement and left Meckel's cave larger than right Meckel's cave. Pituitary gland is unremarkable. Vascular: As below. Skull and upper cervical spine: Normal marrow signal. Sinuses/Orbits: Negative. Other: None. MRA HEAD FINDINGS Internal carotid arteries:  Patent. Anterior cerebral arteries:  Patent. Middle cerebral arteries: Patent. Anterior communicating artery: Patent. Posterior communicating arteries: Patent left. No right identified, likely hypoplastic or absent. Posterior cerebral arteries:  Patent. Basilar artery:  Patent. Vertebral arteries:  Patent. No evidence of high-grade stenosis, large vessel occlusion, or aneurysm unless noted above. IMPRESSION: 1. No acute intracranial abnormality. 2. Optic nerve sheath enlargement and left Meckel's cave larger than right Meckel's cave. This combination of findings can be seen with idiopathic intracranial hypertension in the appropriate clinical setting. 3. Normal MRA of the head. Electronically Signed   By: Mitzi Hansen M.D.   On: 07/12/2018 06:05    EKG:   Orders placed or performed during the hospital encounter of 07/11/18  .  EKG 12-Lead  . EKG 12-Lead  . ED EKG  . ED EKG    ASSESSMENT AND PLAN:   52 year old female with past medical history significant for depression and anxiety, CAD, seizure disorder, GERD presents to hospital secondary to left-sided weakness and heaviness.  1.  Left-sided paresthesias-I of the brain negative for any acute findings. -She does have optic nerve sheath enlargement and signs consistent with idiopathic intracranial hypertension -MRA of the head is negative, carotid Dopplers with no hemodynamically significant stenosis. -CT  of the C-spine showing some degenerative arthritis changes. -Echocardiogram is pending.  Neurology consult.  Neurochecks - started on asa, on statin as well- change to lipitor  2. Seizure disorder-had a 72-hour EEG done as outpatient.  Follows with outpatient neurology.  On Vimpat twice a day and also Lamictal twice a day  3.  Depression and anxiety-on Celexa  4.  GERD-on Protonix  5.  DVT prophylaxis-on Lovenox  PT consult pending     All the records are reviewed and case discussed with Care Management/Social Workerr. Management plans discussed with the patient, family and they are in agreement.  CODE STATUS: Full Code  TOTAL TIME TAKING CARE OF THIS PATIENT: 37 minutes.   POSSIBLE D/C IN 1-2 DAYS, DEPENDING ON CLINICAL CONDITION.   Enid Baas M.D on 07/12/2018 at 11:12 AM  Between 7am to 6pm - Pager - [phone]  After 6pm go to [email or web address] - password [email or web address]  [facility]  Office  [phone]  CC: Primary care physician; System, Pcp Not In

## 2018-07-12 NOTE — Progress Notes (Signed)
*  PRELIMINARY RESULTS* Echocardiogram 2D Echocardiogram has been performed.  Joanette GulaJoan M Belal Scallon 07/12/2018, 12:06 PM

## 2018-07-12 NOTE — Consult Note (Addendum)
Referring Physician: Shaune Pollack, MD    Chief Complaint: Bilateral leg weakness, imbalance and falls  HPI: Kelly [name] is an 52 y.o. female with history of seizure disorder, schizophrenia and anxiety disorder who is following with outpatient neurology Dr. Malvin Johns for management of seizure disorder. She presented to the ED on 07/11/2018 with complaints of progressive bilateral leg weakness causing her multiple falls. She state that her legs especially the left side are giving out on her causing her to fall. She feels that this has been worse over the last 3-5 days. She describes associated symptoms of vision blurriness, imbalance and inability to walk. Denies associated altered sensorium, speech abnormality, cranial nerve deficit, focal motor or sensory deficits, diplopia, or vomiting, syncope or LOC, paresthesia (numbness, tingling, pins-and-needles sensation) or a heavy feeling in an extremity. Patient report that she recently saw her Neurologist for seizure breakthrough and had her medication adjusted. She report usually she has nocturnal seizures in which she wakes up on the floor with urinary incontinence and tongue laceration. She believes that her most recent seizure was 7/14. She is currently taking Lamictal 200 mg in the morning and 250 mg at night, and Vimpat 200 mg two times a day. She has not had any changes in psychiatric medications. Patient state that she sometimes wakes up with headaches but this does not last the whole day. She mostly has neck pain and dizziness for which she sees her  Primary care doctor for.    Past Medical History:  Diagnosis Date  . Acid reflux   . Anxiety   . Hypercholesteremia   . Myocardial infarction (HCC)   . Schizophrenia (HCC)   . Seizures (HCC)     Past Surgical History:  Procedure Laterality Date  . CARDIAC CATHETERIZATION    . CESAREAN SECTION    . DILATION AND CURETTAGE OF UTERUS    . FOOT SURGERY    . TONSILLECTOMY      Family History   Problem Relation Age of Onset  . Breast cancer Mother   . Diabetes Maternal Aunt   . Ovarian cancer Neg Hx   . Colon cancer Neg Hx    Social History:  reports that she has quit smoking. She has quit using smokeless tobacco. She reports that she drinks alcohol. She reports that she has current or past drug history. Drug: Marijuana.  Allergies: No Known Allergies  Medications:  I have reviewed the patient's current medications. Prior to Admission:  Medications Prior to Admission  Medication Sig Dispense Refill Last Dose  . Cholecalciferol (VITAMIN D3) 50000 units CAPS TAKE ONE CAPSULE BY MOUTH ONE TIME PER WEEK  11 07/10/2018 at Unknown time  . citalopram (CELEXA) 20 MG tablet Take 20 mg by mouth daily.  3 07/11/2018 at Unknown time  . esomeprazole (NEXIUM) 40 MG capsule Take 40 mg by mouth daily.   3 07/11/2018 at Unknown time  . lamoTRIgine (LAMICTAL) 200 MG tablet Take 200 mg by mouth at bedtime.   2 07/10/2018 at Unknown time  . LATUDA 60 MG TABS Take 1 tablet by mouth daily.  1 07/11/2018 at Unknown time  . montelukast (SINGULAIR) 10 MG tablet Take 10 mg by mouth at bedtime.   11 07/10/2018 at Unknown time  . pravastatin (PRAVACHOL) 20 MG tablet TAKE 1 TABLET BY MOUTH EVERYDAY AT BEDTIME  5 07/10/2018 at Unknown time  . propranolol ER (INDERAL LA) 60 MG 24 hr capsule TAKE 1 CAPSULE BY MOUTH EVERYDAY AT BEDTIME  1 07/10/2018 at  Unknown time  . VIMPAT 200 MG TABS tablet Take 200 mg by mouth 2 (two) times daily.   3 07/11/2018 at Unknown time  . hydrOXYzine (ATARAX/VISTARIL) 10 MG tablet Take 10 mg by mouth 3 (three) times daily as needed. for anxiety  1 PRN at PRN   Scheduled: . aspirin EC  325 mg Oral Daily  . atorvastatin  40 mg Oral q1800  . citalopram  20 mg Oral Daily  . enoxaparin (LOVENOX) injection  40 mg Subcutaneous Q24H  . lacosamide  200 mg Oral BID  . lamoTRIgine  200 mg Oral Daily  . lamoTRIgine  250 mg Oral QHS  . lurasidone  60 mg Oral Daily  . montelukast  10 mg Oral QHS   . pantoprazole  40 mg Oral Daily    ROS: History obtained from the patient   General ROS: negative for - chills, fatigue, fever, night sweats, weight gain or weight loss Psychological ROS: negative for - behavioral disorder, hallucinations, memory difficulties, mood swings or suicidal ideation Ophthalmic ROS: negative for - blurry vision, double vision, eye pain or loss of vision ENT ROS: negative for - epistaxis, nasal discharge, oral lesions, sore throat, tinnitus or vertigo Allergy and Immunology ROS: negative for - hives or itchy/watery eyes Hematological and Lymphatic ROS: negative for - bleeding problems, bruising or swollen lymph nodes Endocrine ROS: negative for - galactorrhea, hair pattern changes, polydipsia/polyuria or temperature intolerance Respiratory ROS: negative for - cough, hemoptysis, shortness of breath or wheezing Cardiovascular ROS: negative for - chest pain, dyspnea on exertion, edema or irregular heartbeat Gastrointestinal ROS: negative for - abdominal pain, diarrhea, hematemesis, nausea/vomiting or stool incontinence Genito-Urinary ROS: negative for - dysuria, hematuria, incontinence or urinary frequency/urgency Musculoskeletal ROS: negative for - joint swelling or muscular weakness Neurological ROS: as noted in HPI Dermatological ROS: negative for rash and skin lesion changes   Physical Examination: Blood pressure 124/66, pulse 64, temperature 98 F (36.7 C), temperature source Oral, resp. rate 16, height 5\' 5"  (1.651 m), weight 92.2 kg, last menstrual period 07/11/2018, SpO2 97 %.  General Exam Patient looks appropriate of age, well built, nourished and appropriately groomed.  Cardiovascular Exam: S1, S2 heart sounds present Carotid exam revealed no bruit Lung exam was clear to auscultation  ? Neurological Exam  Mental Status: Alert, Oriented to time, place, person and situation Attention span and concentration seemed appropriate Memory seemed  OK. Intact naming, repetition, comprehension.  Followed 2 step commands - no dysarthria Fund of knowledge seemed appropriate for age and health status.  Cranial Nerves: I. Olfactory not examined II: Visual fields were full. Pupils were equal, round and reactive to light and accommodation.  Discs flat bilaterally.  Unable to perform VA accurately due to patient not having her glasses but able to read the food menu. III,IV, VI: ptosis not present, extra-ocular motions intact bilaterally V,VII: smile symmetric, facial light touch sensation normal bilaterally VIII: Finger rub was heard symmetric in both ears IX, X: Palate and uvular movements are normal and oral sensations are OK, gag reflex deffered XI: Neck muscle strength and shoulder shrug is normal XII: midline tongue extension  Motor Exam: Tone is normal in all extremities Muscle strength in all extremities is 5/5. Left side upper extremity weakness No abnormal movements, fasciculations or atrophy seen  Deep Tendon Reflexes: Right Biceps is 2+, Left Biceps is 2+ Right Triceps is 2+, Left Triceps is 2+ Right Brachioradialis is 2+, Left Brachioradialis is 2+ Right Knee Jerk is 2+, Left Knee  Jerk is 2+ Right Ankle Jerk is 2+, Left Ankle Jerk is 2+ Right Toes are down going, Left Toes are down going  Sensory Exam: Sensations were intact to light touch in all extremities Vibration and proprioception are also intact  Co-ordination: Finger to nose is normal  Gait: Gait and station not tested due to gait unsteadiness and instability  Data Reviewed  Laboratory Studies:  Basic Metabolic Panel: Recent Labs  Lab 07/11/18 1218  NA 138  K 4.1  CL 106  CO2 25  GLUCOSE 99  BUN 15  CREATININE 0.71  CALCIUM 8.8*    Liver Function Tests: No results for input(s): AST, ALT, ALKPHOS, BILITOT, PROT, ALBUMIN in the last 168 hours. No results for input(s): LIPASE, AMYLASE in the last 168 hours. Recent Labs  Lab 07/11/18 1621   AMMONIA 31    CBC: Recent Labs  Lab 07/11/18 1218  WBC 10.9  HGB 12.6  HCT 37.8  MCV 83.1  PLT 253    Cardiac Enzymes: Recent Labs  Lab 07/11/18 1218 07/11/18 1255  CKTOTAL 153  --   TROPONINI  --  <0.03    BNP: Invalid input(s): POCBNP  CBG: No results for input(s): GLUCAP in the last 168 hours.  Microbiology: No results found for this or any previous visit.  Coagulation Studies: No results for input(s): LABPROT, INR in the last 72 hours.  Urinalysis: No results for input(s): COLORURINE, LABSPEC, PHURINE, GLUCOSEU, HGBUR, BILIRUBINUR, KETONESUR, PROTEINUR, UROBILINOGEN, NITRITE, LEUKOCYTESUR in the last 168 hours.  Invalid input(s): APPERANCEUR  Lipid Panel:    Component Value Date/Time   CHOL 219 (H) 07/12/2018 0350   TRIG 82 07/12/2018 0350   HDL 48 07/12/2018 0350   CHOLHDL 4.6 07/12/2018 0350   VLDL 16 07/12/2018 0350   LDLCALC 155 (H) 07/12/2018 0350    HgbA1C:  Lab Results  Component Value Date   HGBA1C 5.1 07/12/2018    Urine Drug Screen:  No results found for: LABOPIA, COCAINSCRNUR, LABBENZ, AMPHETMU, THCU, LABBARB  Alcohol Level: No results for input(s): ETH in the last 168 hours.  Other results: EKG: normal EKG, normal sinus rhythm, unchanged from previous tracings.  05/06/18 72 HOUR EEG IMPRESSION: This 72 hour ambulatory EEG with video in the awake and asleep states is abnormal due to occasional left mid temporal epileptiform discharges seen primarily in the light sleep/drowsy states. This finding suggests cortical irritability and predisposition to focal onset seizures from this location.    Imaging: Ct Head Wo Contrast  Result Date: 07/11/2018 CLINICAL DATA:  Head and posterior neck pain 2 days after seizures, weakness, history MI, former smoker EXAM: CT HEAD WITHOUT CONTRAST CT CERVICAL SPINE WITHOUT CONTRAST TECHNIQUE: Multidetector CT imaging of the head and cervical spine was performed following the standard protocol without  intravenous contrast. Multiplanar CT image reconstructions of the cervical spine were also generated. COMPARISON:  None; correlation MR brain 02/21/2018 FINDINGS: CT HEAD FINDINGS Brain: Normal ventricular morphology. No midline shift or mass effect. Normal appearance of brain parenchyma. No intracranial hemorrhage, mass lesion or evidence of acute infarction. No extra-axial fluid collections. At Vascular: No hyperdense vessels Skull: Intact Sinuses/Orbits: Clear Other: N/A CT CERVICAL SPINE FINDINGS Alignment: Normal Skull base and vertebrae: Osseous mineralization normal. Visualized skull base intact. Vertebral body heights maintained. Disc space narrowing and endplate spur formation at C4-C5 and C5-C6. No fracture, subluxation, or bone destruction. Soft tissues and spinal canal: Prevertebral soft tissues normal thickness. Disc levels:  Otherwise unremarkable Upper chest: Lung apices clear Other: N/A  IMPRESSION: No acute intracranial abnormalities. 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 No acute cervical spine abnormalities. Electronically Signed   By: Ulyses SouthwardMark  Boles M.D.   On: 07/11/2018 15:52   Ct Cervical Spine Wo Contrast  Result Date: 07/11/2018 CLINICAL DATA:  Head and posterior neck pain 2 days after seizures, weakness, history MI, former smoker EXAM: CT HEAD WITHOUT CONTRAST CT CERVICAL SPINE WITHOUT CONTRAST TECHNIQUE: Multidetector CT imaging of the head and cervical spine was performed following the standard protocol without intravenous contrast. Multiplanar CT image reconstructions of the cervical spine were also generated. COMPARISON:  None; correlation MR brain 02/21/2018 FINDINGS: CT HEAD FINDINGS Brain: Normal ventricular morphology. No midline shift or mass effect. Normal appearance of brain parenchyma. No intracranial hemorrhage, mass lesion or evidence of acute infarction. No extra-axial fluid collections. At Vascular: No hyperdense vessels Skull: Intact Sinuses/Orbits: Clear  Other: N/A CT CERVICAL SPINE FINDINGS Alignment: Normal Skull base and vertebrae: Osseous mineralization normal. Visualized skull base intact. Vertebral body heights maintained. Disc space narrowing and endplate spur formation at C4-C5 and C5-C6. No fracture, subluxation, or bone destruction. Soft tissues and spinal canal: Prevertebral soft tissues normal thickness. Disc levels:  Otherwise unremarkable Upper chest: Lung apices clear Other: N/A IMPRESSION: No acute intracranial abnormalities. 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 No acute cervical spine abnormalities. Electronically Signed   By: Ulyses SouthwardMark  Boles M.D.   On: 07/11/2018 15:52   Mr Brain Wo Contrast  Result Date: 07/12/2018 CLINICAL DATA:  52 y/o F; 3 days of bilateral lower extremity weakness and falls. Weakness greater in the left lower extremity. Patient reports several seizures over the last month. EXAM: MRI HEAD WITHOUT CONTRAST MRA HEAD WITHOUT CONTRAST TECHNIQUE: Multiplanar, multiecho pulse sequences of the brain and surrounding structures were obtained without intravenous contrast. Angiographic images of the head were obtained using MRA technique without contrast. COMPARISON:  02/21/2018 MRI head. 07/11/2018 CT head and CT cervical spine. FINDINGS: MRI HEAD FINDINGS Brain: No acute infarction, hemorrhage, hydrocephalus, extra-axial collection or mass lesion. Stable nonspecific focus of T2 FLAIR hyperintense signal abnormality in the left anterior insula of unlikely significance. Optic nerve sheath enlargement and left Meckel's cave larger than right Meckel's cave. Pituitary gland is unremarkable. Vascular: As below. Skull and upper cervical spine: Normal marrow signal. Sinuses/Orbits: Negative. Other: None. MRA HEAD FINDINGS Internal carotid arteries:  Patent. Anterior cerebral arteries:  Patent. Middle cerebral arteries: Patent. Anterior communicating artery: Patent. Posterior communicating arteries: Patent left. No right  identified, likely hypoplastic or absent. Posterior cerebral arteries:  Patent. Basilar artery:  Patent. Vertebral arteries:  Patent. No evidence of high-grade stenosis, large vessel occlusion, or aneurysm unless noted above. IMPRESSION: 1. No acute intracranial abnormality. 2. Optic nerve sheath enlargement and left Meckel's cave larger than right Meckel's cave. This combination of findings can be seen with idiopathic intracranial hypertension in the appropriate clinical setting. 3. Normal MRA of the head. Electronically Signed   By: Mitzi HansenLance  Furusawa-Stratton M.D.   On: 07/12/2018 06:05   [place] Carotid Bilateral (at [facility] And [facility] Only)  Result Date: 07/12/2018 CLINICAL DATA:  Left-sided weakness, visual disturbance, syncope, coronary artery disease and hyperlipidemia. EXAM: BILATERAL CAROTID DUPLEX ULTRASOUND TECHNIQUE: Wallace CullensGray scale imaging, color Doppler and duplex ultrasound were performed of bilateral carotid and vertebral arteries in the neck. COMPARISON:  None. FINDINGS: Criteria: Quantification of carotid stenosis is based on velocity parameters that correlate the residual internal carotid diameter with NASCET-based stenosis levels, using the diameter of the distal internal carotid lumen as  the denominator for stenosis measurement. The following velocity measurements were obtained: RIGHT ICA:  98/21 cm/sec CCA:  104/27 cm/sec SYSTOLIC ICA/CCA RATIO:  0.9 ECA:  89 cm/sec LEFT ICA:  97/31 cm/sec CCA:  78/22 cm/sec SYSTOLIC ICA/CCA RATIO:  1.2 ECA:  105 cm/sec RIGHT CAROTID ARTERY: The common carotid artery demonstrates mild intimal thickening. The ICA demonstrates no focal plaque and normal velocities and waveforms. There is no evidence of ICA stenosis. RIGHT VERTEBRAL ARTERY: Antegrade flow with normal waveform and velocity. LEFT CAROTID ARTERY: The common carotid artery demonstrates mild intimal thickening. There is a mild amount of noncalcified plaque at the level of the carotid bulb. There is limited  visualization of the left internal carotid artery due to tortuosity and depth of the vessel. No clear focal plaque or evidence of ICA stenosis. LEFT VERTEBRAL ARTERY: Antegrade flow with normal waveform and velocity. IMPRESSION: Mild plaque at the level of the left carotid bulb. No evidence of right ICA plaque or stenosis. Limited visualization of the left ICA due to tortuosity and depth of the vessel. No focal plaque or evidence of left ICA stenosis in the visualized segment. Electronically Signed   By: Irish Lack M.D.   On: 07/12/2018 09:41   Mr Maxine Glenn Head/brain ZO Cm  Result Date: 07/12/2018 CLINICAL DATA:  52 y/o F; 3 days of bilateral lower extremity weakness and falls. Weakness greater in the left lower extremity. Patient reports several seizures over the last month. EXAM: MRI HEAD WITHOUT CONTRAST MRA HEAD WITHOUT CONTRAST TECHNIQUE: Multiplanar, multiecho pulse sequences of the brain and surrounding structures were obtained without intravenous contrast. Angiographic images of the head were obtained using MRA technique without contrast. COMPARISON:  02/21/2018 MRI head. 07/11/2018 CT head and CT cervical spine. FINDINGS: MRI HEAD FINDINGS Brain: No acute infarction, hemorrhage, hydrocephalus, extra-axial collection or mass lesion. Stable nonspecific focus of T2 FLAIR hyperintense signal abnormality in the left anterior insula of unlikely significance. Optic nerve sheath enlargement and left Meckel's cave larger than right Meckel's cave. Pituitary gland is unremarkable. Vascular: As below. Skull and upper cervical spine: Normal marrow signal. Sinuses/Orbits: Negative. Other: None. MRA HEAD FINDINGS Internal carotid arteries:  Patent. Anterior cerebral arteries:  Patent. Middle cerebral arteries: Patent. Anterior communicating artery: Patent. Posterior communicating arteries: Patent left. No right identified, likely hypoplastic or absent. Posterior cerebral arteries:  Patent. Basilar artery:  Patent.  Vertebral arteries:  Patent. No evidence of high-grade stenosis, large vessel occlusion, or aneurysm unless noted above. IMPRESSION: 1. No acute intracranial abnormality. 2. Optic nerve sheath enlargement and left Meckel's cave larger than right Meckel's cave. This combination of findings can be seen with idiopathic intracranial hypertension in the appropriate clinical setting. 3. Normal MRA of the head. Electronically Signed   By: Mitzi Hansen M.D.   On: 07/12/2018 06:05    Patient seen and examined.  Clinical course and management discussed.  Necessary edits performed.  I agree with the above.  Assessment and plan of care developed and discussed below.    Assessment: 52 y.o. female presenting with bilateral leg weakness left>right with multiple falls, dizziness, and blurry vision likely medication side effects from recent increase in her AEDs. Unlikely stroke given progressive presentation over few days, functional features on neurological examination and negative MRI/MRA brain showing no acute intracranial abnormality.  Films have been reviewed.  Noted findings of optic nerve sheath enlargement concerning for Idiopathic Intracranial Hypertension (IIH). However have low suspicion for elevated intracranial pressure in the absence of optic disc swelling on  examination.  CT cervical spine negative. Carotid ultrasound negative for significant stenosis. She has had a prolonged EEG on 05/06/2018 showing occasional left mid temporal epileptiform discharges. HgbA1c, 5.1, LDL - 155. She takes Atorvastatin 40 mg. Pending Lamictal levels as well.   Stroke Risk Factors - hyperlipidemia and hypertension  Plan: 1. Recommend decreasing Lamictal to 200 mg two times day. Continue Vimpat 200 mg two times a day. Follow up with outpatient neurology for seizure management.  2. PT consult to evaluate ambulation and monitor progress as anticonvulsant therapy addressed. 3. Echocardiogram pending 4. Telemetry  monitoring 5. Frequent neuro checks 6. Seizure precautions 7. Patient reports not taking Latuda at home.  Would not reinstitute until medication regimen stabilized. 8. Orthostatic vitals   This patient was staffed with Dr. Verlon Au, Thad Ranger who personally evaluated patient, reviewed documentation and agreed with assessment and plan of care as above.  Webb Silversmith, DNP, FNP-BC Board certified Nurse Practitioner Neurology Department  07/12/2018, 1:43 PM     Thana Farr, MD Neurology [phone]  07/12/2018  2:47 PM

## 2018-07-12 NOTE — Evaluation (Signed)
Occupational Therapy Evaluation Patient Details Name: Kelly [name] MRN: [id number] DOB: 07-25-1966 Today's Date: 07/12/2018    History of Present Illness 52 y.o. female with history of seizure disorder, schizophrenia and anxiety disorder.   She presented to the ED on 07/11/2018 with complaints of progressive bilateral leg weakness causing her multiple falls (states she has been crawling around her home most of the last few days). She state that her legs (especially left) are giving out on her causing her to fall. She feels that this has been worse over the last 3-5 days. She describes associated symptoms of vision blurriness, imbalance and inability to walk.  Imaging reveals no acute (only chronic) CVA.   Clinical Impression   Pt seen for OT evaluation this date. Prior to hospital admission, pt was I with all aspects of selfcare and IADLs within the home. She needed transportation to the grocery and to pay bills from a friend 1-2x/mo and received medicare transportation for doctor's appointments.  Pt lives in handicap apartment with pull chords in bedroom and bathroom in case of fall, she has grab bars in bathroom and ramped entrance to her apartment as well.  Currently pt demonstrates slight impairments in sitting and standing balance requiring MIN cues for safety modifications for seated and standing ADLs.  Pt does not appear to need acute OT follow up at this time d/t high level of independence noted despite current reported and noted L sided weakness. Upon hospital discharge, recommend pt discharge home independently.    Follow Up Recommendations  No OT follow up    Equipment Recommendations  Tub/shower seat    Recommendations for Other Services       Precautions / Restrictions Precautions Precautions: Fall Restrictions Weight Bearing Restrictions: No      Mobility Bed Mobility Overal bed mobility: Independent             General bed mobility comments: Pt able to get to  sitting EOB w/o use of bedrail with flat HOB  Transfers Overall transfer level: Modified independent Equipment used: Rolling walker (2 wheeled)             General transfer comment: utilized B UEs to maintain static stand with FWW    Balance Overall balance assessment: Needs assistance Sitting-balance support: No upper extremity supported Sitting balance-Leahy Scale: Good     Standing balance support: Bilateral upper extremity supported Standing balance-Leahy Scale: Fair Standing balance comment: reliant on walker, no LOBs with UE assist                           ADL either performed or assessed with clinical judgement   ADL Overall ADL's : Modified independent                                       General ADL Comments: demos UB/LB dressing, seated EOB using modified technique-hip hike to donn pants in sitting, standing with use of bedrail to stabilize for clothing management over hips, dressing L UE first d/t L UE weakness and doffing UE clothing on L side second.       Vision Baseline Vision/History: No visual deficits       Perception     Praxis      Pertinent Vitals/Pain Pain Assessment: No/denies pain     Hand Dominance     Extremity/Trunk Assessment Upper Extremity Assessment Upper  Extremity Assessment: RUE deficits/detail;LUE deficits/detail RUE Deficits / Details: 4/5 shld/elbow flex/ext and abd/add, hand grip 4/5 LUE Deficits / Details: 3-/5 shld/elbow flex/ext and abd/add, hand grip 3+/5   Lower Extremity Assessment Lower Extremity Assessment: Defer to PT evaluation       Communication Communication Communication: No difficulties   Cognition Arousal/Alertness: Awake/alert Behavior During Therapy: WFL for tasks assessed/performed Overall Cognitive Status: Within Functional Limits for tasks assessed                                     General Comments       Exercises Other Exercises Other  Exercises: Pt educated on modified technique when bending over seated or standing to assist with maintaining standing balance-to not turn/twist while bent over d/t balance loss Other Exercises: Pt edicated on safe hand placement with use of FWW for sit<>stand   Shoulder Instructions      Home Living Family/patient expects to be discharged to:: Private residence Living Arrangements: Alone Available Help at Discharge: Friend(s);Available PRN/intermittently Type of Home: Apartment Home Access: Ramped entrance     Home Layout: One level     Bathroom Shower/Tub: Producer, television/film/videoWalk-in shower   Bathroom Toilet: Standard Bathroom Accessibility: Yes   Home Equipment: Grab bars - tub/shower   Additional Comments: Pt has emergency pull cords in bathroom and bed room      Prior Functioning/Environment Level of Independence: Independent        Comments: Pt reports she walks with no AD in home and in community, has friend that takes her to grocery store and to pay bills 1x/month, has medicare transportation to doctor appointments. Pt reports performs all ADLs and home IADLs independently.         OT Problem List: Decreased strength;Decreased activity tolerance;Decreased knowledge of use of DME or AE      OT Treatment/Interventions:      OT Goals(Current goals can be found in the care plan section) Acute Rehab OT Goals Patient Stated Goal: go home OT Goal Formulation: All assessment and education complete, DC therapy  OT Frequency:     Barriers to D/C:            Co-evaluation              AM-PAC PT "6 Clicks" Daily Activity     Outcome Measure Help from another person eating meals?: None Help from another person taking care of personal grooming?: None Help from another person toileting, which includes using toliet, bedpan, or urinal?: None Help from another person bathing (including washing, rinsing, drying)?: None Help from another person to put on and taking off regular upper  body clothing?: None Help from another person to put on and taking off regular lower body clothing?: None 6 Click Score: 24   End of Session Equipment Utilized During Treatment: Gait belt;Rolling walker  Activity Tolerance: Patient tolerated treatment well Patient left: in bed;with call bell/phone within reach  OT Visit Diagnosis: Unsteadiness on feet (R26.81)                Time: [id number] OT Time Calculation (min): 25 min Charges:  OT General Charges $OT Visit: 1 Visit OT Evaluation $OT Eval Moderate Complexity: 1 Mod OT Treatments $Self Care/Home Management : 8-22 mins  Rejeana Brocklison Sokhna Christoph, MS, OTR/L ascom [phone] or [phone] 07/12/18, 4:21 PM

## 2018-07-12 NOTE — Progress Notes (Signed)
SLP Cancellation Note  Patient Details Name: Kelly [name] MRN: [id number] DOB: 1966-03-10   Cancelled treatment:       Reason Eval/Treat Not Completed: SLP screened, no needs identified, will sign off(chart reviewed; consulted NSG then met w/ pt in room). Pt denied any difficulty swallowing and is currently on a regular diet; tolerates swallowing pills w/ water per NSG. Pt conversed at conversational level to order breakfast meal w/ SLP w/out deficits noted; pt denied any speech-language deficits; NSG agreed she had not noted anything in her interaction w/ pt.  No further skilled ST services indicated as pt appears at her baseline. Pt agreed. NSG to reconsult if any change in status while admitted.    Orinda Kenner, MS, CCC-SLP Watson,Katherine 07/12/2018, 8:14 AM

## 2018-07-13 DIAGNOSIS — R51 Headache: Secondary | ICD-10-CM | POA: Diagnosis present

## 2018-07-13 DIAGNOSIS — F329 Major depressive disorder, single episode, unspecified: Secondary | ICD-10-CM | POA: Diagnosis present

## 2018-07-13 DIAGNOSIS — Z833 Family history of diabetes mellitus: Secondary | ICD-10-CM | POA: Diagnosis not present

## 2018-07-13 DIAGNOSIS — K219 Gastro-esophageal reflux disease without esophagitis: Secondary | ICD-10-CM | POA: Diagnosis present

## 2018-07-13 DIAGNOSIS — R531 Weakness: Secondary | ICD-10-CM | POA: Diagnosis present

## 2018-07-13 DIAGNOSIS — F419 Anxiety disorder, unspecified: Secondary | ICD-10-CM | POA: Diagnosis present

## 2018-07-13 DIAGNOSIS — R296 Repeated falls: Secondary | ICD-10-CM | POA: Diagnosis present

## 2018-07-13 DIAGNOSIS — Z79899 Other long term (current) drug therapy: Secondary | ICD-10-CM | POA: Diagnosis not present

## 2018-07-13 DIAGNOSIS — Z803 Family history of malignant neoplasm of breast: Secondary | ICD-10-CM | POA: Diagnosis not present

## 2018-07-13 DIAGNOSIS — M4692 Unspecified inflammatory spondylopathy, cervical region: Secondary | ICD-10-CM | POA: Diagnosis present

## 2018-07-13 DIAGNOSIS — I10 Essential (primary) hypertension: Secondary | ICD-10-CM | POA: Diagnosis present

## 2018-07-13 DIAGNOSIS — Z87891 Personal history of nicotine dependence: Secondary | ICD-10-CM | POA: Diagnosis not present

## 2018-07-13 DIAGNOSIS — I252 Old myocardial infarction: Secondary | ICD-10-CM | POA: Diagnosis not present

## 2018-07-13 DIAGNOSIS — I2510 Atherosclerotic heart disease of native coronary artery without angina pectoris: Secondary | ICD-10-CM | POA: Diagnosis present

## 2018-07-13 DIAGNOSIS — Z9089 Acquired absence of other organs: Secondary | ICD-10-CM | POA: Diagnosis not present

## 2018-07-13 DIAGNOSIS — E7800 Pure hypercholesterolemia, unspecified: Secondary | ICD-10-CM | POA: Diagnosis present

## 2018-07-13 DIAGNOSIS — G932 Benign intracranial hypertension: Secondary | ICD-10-CM | POA: Diagnosis present

## 2018-07-13 DIAGNOSIS — G40909 Epilepsy, unspecified, not intractable, without status epilepticus: Secondary | ICD-10-CM | POA: Diagnosis present

## 2018-07-13 DIAGNOSIS — F209 Schizophrenia, unspecified: Secondary | ICD-10-CM | POA: Diagnosis present

## 2018-07-13 LAB — COMPREHENSIVE METABOLIC PANEL
ALT: 14 U/L (ref 0–44)
ANION GAP: 5 (ref 5–15)
AST: 21 U/L (ref 15–41)
Albumin: 3.3 g/dL — ABNORMAL LOW (ref 3.5–5.0)
Alkaline Phosphatase: 65 U/L (ref 38–126)
BILIRUBIN TOTAL: 0.5 mg/dL (ref 0.3–1.2)
BUN: 13 mg/dL (ref 6–20)
CO2: 26 mmol/L (ref 22–32)
Calcium: 8.8 mg/dL — ABNORMAL LOW (ref 8.9–10.3)
Chloride: 109 mmol/L (ref 98–111)
Creatinine, Ser: 0.77 mg/dL (ref 0.44–1.00)
GFR calc non Af Amer: >60 mL/min (ref >60)
Glucose, Bld: 120 mg/dL — ABNORMAL HIGH (ref 70–99)
POTASSIUM: 3.9 mmol/L (ref 3.5–5.1)
Sodium: 140 mmol/L (ref 135–145)
TOTAL PROTEIN: 6.5 g/dL (ref 6.5–8.1)

## 2018-07-13 LAB — HIV ANTIBODY (ROUTINE TESTING W REFLEX): HIV Screen 4th Generation wRfx: NONREACTIVE

## 2018-07-13 MED ORDER — LAMOTRIGINE 200 MG PO TABS: 200.0000 mg | ORAL_TABLET | Freq: Two times a day (BID) | ORAL | 2 refills | Status: AC

## 2018-07-13 MED ORDER — LAMOTRIGINE 100 MG PO TABS
200.0000 mg | ORAL_TABLET | Freq: Two times a day (BID) | ORAL | Status: DC
  Administered 2018-07-13: 11:00:00 200 mg via ORAL
  Filled 2018-07-13: qty 2

## 2018-07-13 NOTE — Plan of Care (Signed)
  Problem: Education: Goal: Knowledge of General Education information will improve Description Including pain rating scale, medication(s)/side effects and non-pharmacologic comfort measures Outcome: Progressing   Problem: Education: Goal: Knowledge of disease or condition will improve Outcome: Progressing Goal: Knowledge of secondary prevention will improve Outcome: Progressing Goal: Knowledge of patient specific risk factors addressed and post discharge goals established will improve Outcome: Progressing   Problem: Coping: Goal: Will verbalize positive feelings about self Outcome: Progressing   Problem: Health Behavior/Discharge Planning: Goal: Ability to manage health-related needs will improve Outcome: Progressing   Problem: Self-Care: Goal: Ability to communicate needs accurately will improve Outcome: Progressing   Problem: Ischemic Stroke/TIA Tissue Perfusion: Goal: Complications of ischemic stroke/TIA will be minimized Outcome: Progressing

## 2018-07-13 NOTE — Care Management Note (Signed)
Case Management Note  Patient Details  Name: Kelly [name] MRN: [id number] Date of Birth: 20-Aug-1966  Subjective/Objective:  Met with patient at bedside to discuss home health services. She declined any home health at this time. She feels like she is fine and would like to use them in the future if she needs them. I instructed her on how to access home health if she needed it by calling her PCP.Patient verbalized understanding.                   Action/Plan:   Expected Discharge Date:  07/13/18               Expected Discharge Plan:  Home/Self Care  In-House Referral:     Discharge planning Services  CM Consult  Post Acute Care Choice:  Home Health Choice offered to:  Patient  DME Arranged:    DME Agency:     HH Arranged:  PT, OT, Patient Refused [place] Agency:     Status of Service:  Completed, signed off  If discussed at [place] of Stay Meetings, dates discussed:    Additional Comments:  Jolly Mango, RN 07/13/2018, 1:59 PM

## 2018-07-13 NOTE — Progress Notes (Signed)
Subjective: Patient continues with her left sided complaints that she reports have been present for years.  CT of cervical spine shows no etiology for symptomatology.  Patient able to ambulate using a rolling walker with therapy. Reports some improvement in her presenting symptoms.  No seizure activity noted.    Objective: Current vital signs: BP 137/69 (BP Location: Right Arm)   Pulse 72   Temp 97.8 F (36.6 C) (Oral)   Resp 18   Ht 5\' 5"  (1.651 m)   Wt 92.2 kg   LMP 07/11/2018   SpO2 98%   BMI 33.82 kg/m  Vital signs in last 24 hours: Temp:  [97.7 F (36.5 C)-98 F (36.7 C)] 97.8 F (36.6 C) (08/28 1014) Pulse Rate:  [66-74] 72 (08/28 1014) Resp:  [14-20] 18 (08/28 1014) BP: (127-141)/(65-75) 137/69 (08/28 1014) SpO2:  [92 %-100 %] 98 % (08/28 1014)  Intake/Output from previous day: 08/27 0701 - 08/28 0700 In: 883.8 [P.O.:240; I.V.:643.8] Out: -  Intake/Output this shift: Total I/O In: 1072.6 [P.O.:240; I.V.:832.6] Out: -  Nutritional status:  Diet Order            Diet Heart Room service appropriate? Yes; Fluid consistency: Thin  Diet effective now              Neurologic Exam: Mental Status: Alert, oriented, thought content appropriate.  Speech fluent without evidence of aphasia.  Able to follow 3 step commands without difficulty. Cranial Nerves: II: Discs flat bilaterally; Visual fields grossly normal, pupils equal, round, reactive to light and accommodation III,IV, VI: ptosis not present, extra-ocular motions intact bilaterally V,VII: smile symmetric, facial light touch sensation normal bilaterally VIII: hearing normal bilaterally IX,X: gag reflex present XI: bilateral shoulder shrug XII: midline tongue extension Motor: Wtth encouragement able to move all extremities against gravity strongly.  At rest holding left arm flexed against body.     Lab Results: Basic Metabolic Panel: Recent Labs  Lab 07/11/18 1218 07/13/18 0338  NA 138 140  K 4.1 3.9   CL 106 109  CO2 25 26  GLUCOSE 99 120*  BUN 15 13  CREATININE 0.71 0.77  CALCIUM 8.8* 8.8*    Liver Function Tests: Recent Labs  Lab 07/13/18 0338  AST 21  ALT 14  ALKPHOS 65  BILITOT 0.5  PROT 6.5  ALBUMIN 3.3*   No results for input(s): LIPASE, AMYLASE in the last 168 hours. Recent Labs  Lab 07/11/18 1621  AMMONIA 31    CBC: Recent Labs  Lab 07/11/18 1218  WBC 10.9  HGB 12.6  HCT 37.8  MCV 83.1  PLT 253    Cardiac Enzymes: Recent Labs  Lab 07/11/18 1218 07/11/18 1255  CKTOTAL 153  --   TROPONINI  --  <0.03    Lipid Panel: Recent Labs  Lab 07/12/18 0350  CHOL 219*  TRIG 82  HDL 48  CHOLHDL 4.6  VLDL 16  LDLCALC 161155*    CBG: No results for input(s): GLUCAP in the last 168 hours.  Microbiology: No results found for this or any previous visit.  Coagulation Studies: No results for input(s): LABPROT, INR in the last 72 hours.  Imaging: Ct Head Wo Contrast  Result Date: 07/11/2018 CLINICAL DATA:  Head and posterior neck pain 2 days after seizures, weakness, history MI, former smoker EXAM: CT HEAD WITHOUT CONTRAST CT CERVICAL SPINE WITHOUT CONTRAST TECHNIQUE: Multidetector CT imaging of the head and cervical spine was performed following the standard protocol without intravenous contrast. Multiplanar CT image reconstructions  of the cervical spine were also generated. COMPARISON:  None; correlation MR brain 02/21/2018 FINDINGS: CT HEAD FINDINGS Brain: Normal ventricular morphology. No midline shift or mass effect. Normal appearance of brain parenchyma. No intracranial hemorrhage, mass lesion or evidence of acute infarction. No extra-axial fluid collections. At Vascular: No hyperdense vessels Skull: Intact Sinuses/Orbits: Clear Other: N/A CT CERVICAL SPINE FINDINGS Alignment: Normal Skull base and vertebrae: Osseous mineralization normal. Visualized skull base intact. Vertebral body heights maintained. Disc space narrowing and endplate spur formation at  C4-C5 and C5-C6. No fracture, subluxation, or bone destruction. Soft tissues and spinal canal: Prevertebral soft tissues normal thickness. Disc levels:  Otherwise unremarkable Upper chest: Lung apices clear Other: N/A IMPRESSION: No acute intracranial abnormalities. 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 No acute cervical spine abnormalities. Electronically Signed   By: Ulyses Southward M.D.   On: 07/11/2018 15:52   Ct Cervical Spine Wo Contrast  Result Date: 07/11/2018 CLINICAL DATA:  Head and posterior neck pain 2 days after seizures, weakness, history MI, former smoker EXAM: CT HEAD WITHOUT CONTRAST CT CERVICAL SPINE WITHOUT CONTRAST TECHNIQUE: Multidetector CT imaging of the head and cervical spine was performed following the standard protocol without intravenous contrast. Multiplanar CT image reconstructions of the cervical spine were also generated. COMPARISON:  None; correlation MR brain 02/21/2018 FINDINGS: CT HEAD FINDINGS Brain: Normal ventricular morphology. No midline shift or mass effect. Normal appearance of brain parenchyma. No intracranial hemorrhage, mass lesion or evidence of acute infarction. No extra-axial fluid collections. At Vascular: No hyperdense vessels Skull: Intact Sinuses/Orbits: Clear Other: N/A CT CERVICAL SPINE FINDINGS Alignment: Normal Skull base and vertebrae: Osseous mineralization normal. Visualized skull base intact. Vertebral body heights maintained. Disc space narrowing and endplate spur formation at C4-C5 and C5-C6. No fracture, subluxation, or bone destruction. Soft tissues and spinal canal: Prevertebral soft tissues normal thickness. Disc levels:  Otherwise unremarkable Upper chest: Lung apices clear Other: N/A IMPRESSION: No acute intracranial abnormalities. 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 No acute cervical spine abnormalities. Electronically Signed   By: Ulyses Southward M.D.   On: 07/11/2018 15:52   Mr Brain Wo Contrast  Result Date:  07/12/2018 CLINICAL DATA:  52 y/o F; 3 days of bilateral lower extremity weakness and falls. Weakness greater in the left lower extremity. Patient reports several seizures over the last month. EXAM: MRI HEAD WITHOUT CONTRAST MRA HEAD WITHOUT CONTRAST TECHNIQUE: Multiplanar, multiecho pulse sequences of the brain and surrounding structures were obtained without intravenous contrast. Angiographic images of the head were obtained using MRA technique without contrast. COMPARISON:  02/21/2018 MRI head. 07/11/2018 CT head and CT cervical spine. FINDINGS: MRI HEAD FINDINGS Brain: No acute infarction, hemorrhage, hydrocephalus, extra-axial collection or mass lesion. Stable nonspecific focus of T2 FLAIR hyperintense signal abnormality in the left anterior insula of unlikely significance. Optic nerve sheath enlargement and left Meckel's cave larger than right Meckel's cave. Pituitary gland is unremarkable. Vascular: As below. Skull and upper cervical spine: Normal marrow signal. Sinuses/Orbits: Negative. Other: None. MRA HEAD FINDINGS Internal carotid arteries:  Patent. Anterior cerebral arteries:  Patent. Middle cerebral arteries: Patent. Anterior communicating artery: Patent. Posterior communicating arteries: Patent left. No right identified, likely hypoplastic or absent. Posterior cerebral arteries:  Patent. Basilar artery:  Patent. Vertebral arteries:  Patent. No evidence of high-grade stenosis, large vessel occlusion, or aneurysm unless noted above. IMPRESSION: 1. No acute intracranial abnormality. 2. Optic nerve sheath enlargement and left Meckel's cave larger than right Meckel's cave. This combination of findings can  be seen with idiopathic intracranial hypertension in the appropriate clinical setting. 3. Normal MRA of the head. Electronically Signed   By: Mitzi Hansen M.D.   On: 07/12/2018 06:05   US Carotid Bilateral (at [facility] And [facility] Only)  Result Date: 07/12/2018 CLINICAL DATA:  Left-sided weakness,  visual disturbance, syncope, coronary artery disease and hyperlipidemia. EXAM: BILATERAL CAROTID DUPLEX ULTRASOUND TECHNIQUE: Wallace Cullens scale imaging, color Doppler and duplex ultrasound were performed of bilateral carotid and vertebral arteries in the neck. COMPARISON:  None. FINDINGS: Criteria: Quantification of carotid stenosis is based on velocity parameters that correlate the residual internal carotid diameter with NASCET-based stenosis levels, using the diameter of the distal internal carotid lumen as the denominator for stenosis measurement. The following velocity measurements were obtained: RIGHT ICA:  98/21 cm/sec CCA:  104/27 cm/sec SYSTOLIC ICA/CCA RATIO:  0.9 ECA:  89 cm/sec LEFT ICA:  97/31 cm/sec CCA:  78/22 cm/sec SYSTOLIC ICA/CCA RATIO:  1.2 ECA:  105 cm/sec RIGHT CAROTID ARTERY: The common carotid artery demonstrates mild intimal thickening. The ICA demonstrates no focal plaque and normal velocities and waveforms. There is no evidence of ICA stenosis. RIGHT VERTEBRAL ARTERY: Antegrade flow with normal waveform and velocity. LEFT CAROTID ARTERY: The common carotid artery demonstrates mild intimal thickening. There is a mild amount of noncalcified plaque at the level of the carotid bulb. There is limited visualization of the left internal carotid artery due to tortuosity and depth of the vessel. No clear focal plaque or evidence of ICA stenosis. LEFT VERTEBRAL ARTERY: Antegrade flow with normal waveform and velocity. IMPRESSION: Mild plaque at the level of the left carotid bulb. No evidence of right ICA plaque or stenosis. Limited visualization of the left ICA due to tortuosity and depth of the vessel. No focal plaque or evidence of left ICA stenosis in the visualized segment. Electronically Signed   By: Irish Lack M.D.   On: 07/12/2018 09:41   Mr Maxine Glenn Head/brain [facility] Cm  Result Date: 07/12/2018 CLINICAL DATA:  52 y/o F; 3 days of bilateral lower extremity weakness and falls. Weakness greater in the  left lower extremity. Patient reports several seizures over the last month. EXAM: MRI HEAD WITHOUT CONTRAST MRA HEAD WITHOUT CONTRAST TECHNIQUE: Multiplanar, multiecho pulse sequences of the brain and surrounding structures were obtained without intravenous contrast. Angiographic images of the head were obtained using MRA technique without contrast. COMPARISON:  02/21/2018 MRI head. 07/11/2018 CT head and CT cervical spine. FINDINGS: MRI HEAD FINDINGS Brain: No acute infarction, hemorrhage, hydrocephalus, extra-axial collection or mass lesion. Stable nonspecific focus of T2 FLAIR hyperintense signal abnormality in the left anterior insula of unlikely significance. Optic nerve sheath enlargement and left Meckel's cave larger than right Meckel's cave. Pituitary gland is unremarkable. Vascular: As below. Skull and upper cervical spine: Normal marrow signal. Sinuses/Orbits: Negative. Other: None. MRA HEAD FINDINGS Internal carotid arteries:  Patent. Anterior cerebral arteries:  Patent. Middle cerebral arteries: Patent. Anterior communicating artery: Patent. Posterior communicating arteries: Patent left. No right identified, likely hypoplastic or absent. Posterior cerebral arteries:  Patent. Basilar artery:  Patent. Vertebral arteries:  Patent. No evidence of high-grade stenosis, large vessel occlusion, or aneurysm unless noted above. IMPRESSION: 1. No acute intracranial abnormality. 2. Optic nerve sheath enlargement and left Meckel's cave larger than right Meckel's cave. This combination of findings can be seen with idiopathic intracranial hypertension in the appropriate clinical setting. 3. Normal MRA of the head. Electronically Signed   By: Mitzi Hansen M.D.   On: 07/12/2018 06:05  Medications:  I have reviewed the patient's current medications. Scheduled: . aspirin EC  325 mg Oral Daily  . atorvastatin  40 mg Oral q1800  . citalopram  20 mg Oral Daily  . enoxaparin (LOVENOX) injection  40 mg  Subcutaneous Q24H  . lacosamide  200 mg Oral BID  . lamoTRIgine  200 mg Oral BID  . montelukast  10 mg Oral QHS  . pantoprazole  40 mg Oral Daily    Assessment/Plan: Patient now at Lamictal 200mg  BID and Vimpat 200mg  BID.  Concerned that presentation secondary to medication side effects.  Has been seizure free while hospitalized.  Continues with left sided complaints that have been long-standing.  MRI of the brain and CT of the cervical spine provide no etiology for presentation.    Recommendations: 1.  Continue AED's at current dose and patient to return to follow up with Dr. Malvin Johns in one week.     LOS: 0 days   Thana Farr, MD Neurology [phone] 07/13/2018  12:02 PM

## 2018-07-13 NOTE — Progress Notes (Signed)
Discharge instructions given. ABC intact. All questions answered. Verbalized information back to me. Stable ambulation. No further needs. Waiting on ride. [place] aware.

## 2018-07-13 NOTE — Discharge Summary (Signed)
[facility] [place] at [facility]   PATIENT NAME: Kelly [name]    MR#:  [id number]  DATE OF BIRTH:  1966-07-30  DATE OF ADMISSION:  07/11/2018   ADMITTING PHYSICIAN: Nita Sickle, MD  DATE OF DISCHARGE: 07/13/18  PRIMARY CARE PHYSICIAN: System, Pcp Not In   ADMISSION DIAGNOSIS:   Left-sided weakness [R53.1] Multiple falls [R29.6]  DISCHARGE DIAGNOSIS:   Active Problems:   Left-sided weakness   SECONDARY DIAGNOSIS:   Past Medical History:  Diagnosis Date  . Acid reflux   . Anxiety   . Hypercholesteremia   . Myocardial infarction (HCC)   . Schizophrenia (HCC)   . Seizures [facility])     HOSPITAL COURSE:    52 year old female with past medical history significant for depression and anxiety, CAD, seizure disorder, GERD presents to hospital secondary to left-sided weakness and heaviness.  1.  Left-sided paresthesias- MRI of the brain negative for any acute findings. -She does have optic nerve sheath enlargement and signs consistent with idiopathic intracranial hypertension-but no symptoms clinically.  So less likely chances. -Echocardiogram is normal -MRA of the head is negative, carotid Dopplers with no hemodynamically significant stenosis. -CT of the C-spine showing some degenerative arthritis changes. -Appreciate neurology consult.  No indication for aspirin as not a stroke.  Continue her home dosage of statin.  Seizure precautions.  Her seizure medications have been adjusted.  Prolonged EEG as outpatient did reveal some left-sided mild temporal discharges -Patient ambulated well with physical therapy  2. Seizure disorder-had a 72-hour EEG done as outpatient showing abnormal mid temporal epileptiform discharges.. -Continue Vimpat twice a day and Lamictal dose has been adjusted to 200 mg twice daily - lamictal levels pending  3.  Depression and anxiety-on Celexa  4.  GERD-on nexium   Will be discharged home with home health  today    DISCHARGE CONDITIONS:   Guarded  CONSULTS OBTAINED:   Treatment Team:  Thana Farr, MD  DRUG ALLERGIES:   No Known Allergies DISCHARGE MEDICATIONS:   Allergies as of 07/13/2018   No Known Allergies     Medication List    STOP taking these medications   LATUDA 60 MG Tabs Generic drug:  Lurasidone HCl     TAKE these medications   citalopram 20 MG tablet Commonly known as:  CELEXA Take 20 mg by mouth daily.   esomeprazole 40 MG capsule Commonly known as:  NEXIUM Take 40 mg by mouth daily.   hydrOXYzine 10 MG tablet Commonly known as:  ATARAX/VISTARIL Take 10 mg by mouth 3 (three) times daily as needed. for anxiety   lamoTRIgine 200 MG tablet Commonly known as:  LAMICTAL Take 1 tablet (200 mg total) by mouth 2 (two) times daily. What changed:  when to take this   montelukast 10 MG tablet Commonly known as:  SINGULAIR Take 10 mg by mouth at bedtime.   pravastatin 20 MG tablet Commonly known as:  PRAVACHOL TAKE 1 TABLET BY MOUTH EVERYDAY AT BEDTIME   propranolol ER 60 MG 24 hr capsule Commonly known as:  INDERAL LA TAKE 1 CAPSULE BY MOUTH EVERYDAY AT BEDTIME   VIMPAT 200 MG Tabs tablet Generic drug:  lacosamide Take 200 mg by mouth 2 (two) times daily.   Vitamin D3 50000 units Caps TAKE ONE CAPSULE BY MOUTH ONE TIME PER WEEK        DISCHARGE INSTRUCTIONS:   1.  Neurology follow-up in 1 to 2 weeks after discharge  DIET:   Cardiac diet  ACTIVITY:   Activity as tolerated  OXYGEN:   Home Oxygen: No.  Oxygen Delivery: room air  DISCHARGE LOCATION:   home   If you experience worsening of your admission symptoms, develop shortness of breath, life threatening emergency, suicidal or homicidal thoughts you must seek medical attention immediately by calling 911 or calling your MD immediately  if symptoms less severe.  You Must read complete instructions/literature along with all the possible adverse reactions/side effects for  all the Medicines you take and that have been prescribed to you. Take any new Medicines after you have completely understood and accpet all the possible adverse reactions/side effects.   Please note  You were cared for by a hospitalist during your hospital stay. If you have any questions about your discharge medications or the care you received while you were in the hospital after you are discharged, you can call the unit and asked to speak with the hospitalist on call if the hospitalist that took care of you is not available. Once you are discharged, your primary care physician will handle any further medical issues. Please note that NO REFILLS for any discharge medications will be authorized once you are discharged, as it is imperative that you return to your primary care physician (or establish a relationship with a primary care physician if you do not have one) for your aftercare needs so that they can reassess your need for medications and monitor your lab values.    On the day of Discharge:  VITAL SIGNS:   Blood pressure 131/65, pulse 72, temperature 98 F (36.7 C), temperature source Oral, resp. rate 18, height 5\' 5"  (1.651 m), weight 92.2 kg, last menstrual period 07/11/2018, SpO2 98 %.  PHYSICAL EXAMINATION:   GENERAL:  52 y.o.-year-old patient lying in the bed with no acute distress.  EYES: Pupils equal, round, reactive to light and accommodation. No scleral icterus. Extraocular muscles intact.  HEENT: Head atraumatic, normocephalic. Oropharynx and nasopharynx clear.  NECK:  Supple, no jugular venous distention. No thyroid enlargement, no tenderness.  LUNGS: Normal breath sounds bilaterally, no wheezing, rales,rhonchi or crepitation. No use of accessory muscles of respiration. Decreased bibasilar breath sounds CARDIOVASCULAR: S1, S2 normal. No murmurs, rubs, or gallops.  ABDOMEN: Soft, nontender, nondistended. Bowel sounds present. No organomegaly or mass.  EXTREMITIES: No pedal  edema, cyanosis, or clubbing.  NEUROLOGIC: Cranial nerves II through XII are intact. Muscle strength 5/5 in all extremities. Left sided drift noted. Sensation intact. Gait not checked.  PSYCHIATRIC: The patient is alert and oriented x 3.  SKIN: No obvious rash, lesion, or ulcer.   DATA REVIEW:   CBC Recent Labs  Lab 07/11/18 1218  WBC 10.9  HGB 12.6  HCT 37.8  PLT 253    Chemistries  Recent Labs  Lab 07/13/18 0338  NA 140  K 3.9  CL 109  CO2 26  GLUCOSE 120*  BUN 13  CREATININE 0.77  CALCIUM 8.8*  AST 21  ALT 14  ALKPHOS 65  BILITOT 0.5     Microbiology Results  No results found for this or any previous visit.  RADIOLOGY:  No results found.   Management plans discussed with the patient, family and they are in agreement.  CODE STATUS:     Code Status Orders  (From admission, onward)         Start     Ordered   07/11/18 1819  Full code  Continuous     07/11/18 1818  Code Status History    This patient has a current code status but no historical code status.      TOTAL TIME TAKING CARE OF THIS PATIENT: 38 minutes.    Enid Baas M.D on 07/13/2018 at 10:12 AM  Between 7am to 6pm - Pager - [phone]  After 6pm go to [email or web address] - Social research officer, government  [facility] [place] [facility]  Office  [phone]  CC: Primary care physician; System, Pcp Not In   Note: This dictation was prepared with Dragon dictation along with smaller phrase technology. Any transcriptional errors that result from this process are unintentional.

## 2018-07-14 LAB — LAMOTRIGINE LEVEL: Lamotrigine Lvl: 13.4 ug/mL (ref 2.0–20.0)

## 2019-10-06 LAB — LAB REPORT - SCANNED: EGFR: 101

## 2019-11-29 ENCOUNTER — Ambulatory Visit
Admission: RE | Admit: 2019-11-29 | Discharge: 2019-11-29 | Disposition: A | Discharge status: Home / Self Care | Payer: Medicare Other | Source: Ambulatory Visit | Attending: Pulmonary Disease | Admitting: Pulmonary Disease

## 2019-11-29 ENCOUNTER — Ambulatory Visit: Payer: Medicare Other | Admitting: Pulmonary Disease

## 2019-11-29 ENCOUNTER — Encounter: Payer: Self-pay | Admitting: Pulmonary Disease

## 2019-11-29 ENCOUNTER — Other Ambulatory Visit: Payer: Self-pay

## 2019-11-29 VITALS — BP 134/82 | HR 82 | Temp 98.0°F | Ht 65.0 in | Wt 219.4 lb

## 2019-11-29 DIAGNOSIS — R0602 Shortness of breath: Secondary | ICD-10-CM

## 2019-11-29 DIAGNOSIS — J449 Chronic obstructive pulmonary disease, unspecified: Secondary | ICD-10-CM

## 2019-11-29 NOTE — Progress Notes (Signed)
 Assessment & Plan:  1. Shortness of breath (Primary) - CXR 2view; Future  2. Asthmatic bronchitis , chronic (HCC)   Patient Instructions  1.  Continue Breztri for now (inhaler, 2 puffs twice a day)  2.  Use the nebulizer only when needed.  This is a very short quick fix that does not solve the issue of your breathing.  The Breztri should be the maintenance medication.  3.  We will schedule you for breathing test however these are delayed due to COVID-19 restrictions.  4.  We will get a chest x-ray.  5.  We will see you in follow-up in 3 months time call sooner should any new difficulties arise.  6.  The pain you are experiencing on the left side is not coming from your lungs.  7.  Continue your reflux medicine.  Please note: late entry documentation due to logistical difficulties during COVID-19 pandemic. This note is filed for information purposes only, and is not intended to be used for billing, nor does it represent the full scope/nature of the visit in question. Please see any associated scanned media linked to date of encounter for additional pertinent information.  Subjective:    HPI: Kelly [name] is a 54 y.o. female presenting to the pulmonology clinic on 11/29/2019 with report of: pulmonary consult (pt reports of non prod cough, sob with exertion and laying flat and occ wheezing mainly in the morning x2y)     Outpatient Encounter Medications as of 11/29/2019  Medication Sig Note   lamoTRIgine  (LAMICTAL ) 200 MG tablet Take 1 tablet (200 mg total) by mouth 2 (two) times daily.    nortriptyline  (PAMELOR ) 50 MG capsule Take 50 mg by mouth at bedtime. 01/12/2024: Been taking 1 capsule at bedtime   VIMPAT  200 MG TABS tablet Take 200 mg by mouth 2 (two) times daily.     [DISCONTINUED] atorvastatin  (LIPITOR) 40 MG tablet Take 40 mg by mouth at bedtime.    [DISCONTINUED] BREZTRI AEROSPHERE 160-9-4.8 MCG/ACT AERO Inhale 2 puffs into the lungs 2 (two) times daily.     [DISCONTINUED] Cholecalciferol (VITAMIN D3) 50000 units CAPS  (Patient not taking: Reported on 01/18/2024)    [DISCONTINUED] cloNIDine  (CATAPRES ) 0.1 MG tablet Take by mouth. 01/12/2024: Takes 1 tablet twice a day   [DISCONTINUED] losartan  (COZAAR ) 25 MG tablet Take 25 mg by mouth daily.    [DISCONTINUED] montelukast  (SINGULAIR ) 10 MG tablet Take 10 mg by mouth at bedtime.     [DISCONTINUED] omeprazole  (PRILOSEC) 40 MG capsule Take 40 mg by mouth daily.    [DISCONTINUED] VENTOLIN  HFA 108 (90 Base) MCG/ACT inhaler Inhale 2 puffs into the lungs every 6 (six) hours as needed.    [DISCONTINUED] benzonatate  (TESSALON ) 200 MG capsule Take 200 mg by mouth 3 (three) times daily as needed.    [DISCONTINUED] citalopram  (CELEXA ) 20 MG tablet Take 20 mg by mouth daily.    [DISCONTINUED] esomeprazole (NEXIUM) 40 MG capsule Take 40 mg by mouth daily.     [DISCONTINUED] hydrOXYzine  (ATARAX /VISTARIL ) 10 MG tablet Take 10 mg by mouth 3 (three) times daily as needed. for anxiety    [DISCONTINUED] pravastatin  (PRAVACHOL ) 20 MG tablet TAKE 1 TABLET BY MOUTH EVERYDAY AT BEDTIME    [DISCONTINUED] propranolol ER (INDERAL LA) 60 MG 24 hr capsule TAKE 1 CAPSULE BY MOUTH EVERYDAY AT BEDTIME    No facility-administered encounter medications on file as of 11/29/2019.      Objective:   Vitals:   11/29/19 1027  BP: 134/82  Pulse: 82  Temp: 98 F (36.7 C)  Height: 5' 5 (1.651 m)  Weight: 219 lb 6.4 oz (99.5 kg)  SpO2: 97%  TempSrc: Temporal  BMI (Calculated): 36.51     Physical exam documentation is limited by delayed entry of information.

## 2019-11-29 NOTE — Patient Instructions (Addendum)
1.  Continue Breztri for now (inhaler, 2 puffs twice a day)  2.  Use the nebulizer only when needed.  This is a very short quick fix that does not solve the issue of your breathing.  The Markus Daft should be the maintenance medication.  3.  We will schedule you for breathing test however these are delayed due to COVID-19 restrictions.  4.  We will get a chest x-ray.  5.  We will see you in follow-up in 3 months time call sooner should any new difficulties arise.  6.  The pain you are experiencing on the left side is not coming from your lungs.  7.  Continue your reflux medicine.

## 2019-12-04 ENCOUNTER — Other Ambulatory Visit: Payer: Self-pay

## 2019-12-04 DIAGNOSIS — R918 Other nonspecific abnormal finding of lung field: Secondary | ICD-10-CM

## 2019-12-13 ENCOUNTER — Other Ambulatory Visit: Payer: Self-pay

## 2019-12-13 ENCOUNTER — Ambulatory Visit
Admission: RE | Admit: 2019-12-13 | Discharge: 2019-12-13 | Disposition: A | Discharge status: Home / Self Care | Payer: Medicare Other | Source: Ambulatory Visit | Attending: Pulmonary Disease | Admitting: Pulmonary Disease

## 2019-12-13 DIAGNOSIS — R918 Other nonspecific abnormal finding of lung field: Secondary | ICD-10-CM | POA: Diagnosis present

## 2020-03-01 ENCOUNTER — Encounter: Payer: Medicare Other | Admitting: Obstetrics and Gynecology

## 2021-02-24 ENCOUNTER — Other Ambulatory Visit: Payer: Self-pay | Admitting: Physician Assistant

## 2021-02-24 DIAGNOSIS — R569 Unspecified convulsions: Secondary | ICD-10-CM

## 2021-03-04 ENCOUNTER — Ambulatory Visit: Payer: Medicare Other

## 2021-03-10 ENCOUNTER — Other Ambulatory Visit: Payer: Self-pay

## 2021-03-10 ENCOUNTER — Ambulatory Visit
Admission: RE | Admit: 2021-03-10 | Discharge: 2021-03-10 | Disposition: A | Discharge status: Home / Self Care | Payer: Medicare Other | Source: Ambulatory Visit | Attending: Physician Assistant | Admitting: Physician Assistant

## 2021-03-10 DIAGNOSIS — R569 Unspecified convulsions: Secondary | ICD-10-CM | POA: Insufficient documentation

## 2021-04-29 ENCOUNTER — Other Ambulatory Visit: Payer: Self-pay

## 2021-04-29 ENCOUNTER — Emergency Department: Payer: Medicare HMO

## 2021-04-29 ENCOUNTER — Emergency Department
Admission: EM | Admit: 2021-04-29 | Discharge: 2021-04-29 | Disposition: A | Discharge status: Home / Self Care | Payer: Medicare HMO | Attending: Emergency Medicine | Admitting: Emergency Medicine

## 2021-04-29 DIAGNOSIS — R062 Wheezing: Secondary | ICD-10-CM | POA: Diagnosis not present

## 2021-04-29 DIAGNOSIS — R059 Cough, unspecified: Secondary | ICD-10-CM

## 2021-04-29 DIAGNOSIS — R079 Chest pain, unspecified: Secondary | ICD-10-CM | POA: Insufficient documentation

## 2021-04-29 DIAGNOSIS — Z87891 Personal history of nicotine dependence: Secondary | ICD-10-CM | POA: Insufficient documentation

## 2021-04-29 DIAGNOSIS — Z20822 Contact with and (suspected) exposure to covid-19: Secondary | ICD-10-CM | POA: Insufficient documentation

## 2021-04-29 DIAGNOSIS — R0602 Shortness of breath: Secondary | ICD-10-CM

## 2021-04-29 LAB — CBC
HCT: 34.2 % — ABNORMAL LOW (ref 36.0–46.0)
Hemoglobin: 11.1 g/dL — ABNORMAL LOW (ref 12.0–15.0)
MCH: 25.5 pg — ABNORMAL LOW (ref 26.0–34.0)
MCHC: 32.5 g/dL (ref 30.0–36.0)
MCV: 78.4 fL — ABNORMAL LOW (ref 80.0–100.0)
Platelets: 288 10*3/uL (ref 150–400)
RBC: 4.36 MIL/uL (ref 3.87–5.11)
RDW: 19.1 % — ABNORMAL HIGH (ref 11.5–15.5)
WBC: 12.6 10*3/uL — ABNORMAL HIGH (ref 4.0–10.5)
nRBC: 0 % (ref 0.0–0.2)

## 2021-04-29 LAB — TROPONIN I (HIGH SENSITIVITY)
Troponin I (High Sensitivity): 6 ng/L (ref <18)
Troponin I (High Sensitivity): 5 ng/L (ref <18)

## 2021-04-29 LAB — BASIC METABOLIC PANEL
Anion gap: 5 (ref 5–15)
BUN: 16 mg/dL (ref 6–20)
CO2: 27 mmol/L (ref 22–32)
Calcium: 9.2 mg/dL (ref 8.9–10.3)
Chloride: 108 mmol/L (ref 98–111)
Creatinine, Ser: 0.72 mg/dL (ref 0.44–1.00)
GFR, Estimated: >60 mL/min (ref >60)
Glucose, Bld: 105 mg/dL — ABNORMAL HIGH (ref 70–99)
Potassium: 3.8 mmol/L (ref 3.5–5.1)
Sodium: 140 mmol/L (ref 135–145)

## 2021-04-29 LAB — RESP PANEL BY RT-PCR (FLU A&B, COVID) ARPGX2
Influenza A by PCR: NEGATIVE
Influenza B by PCR: NEGATIVE
SARS Coronavirus 2 by RT PCR: NEGATIVE

## 2021-04-29 MED ORDER — BENZONATATE 100 MG PO CAPS: 100.0000 mg | ORAL_CAPSULE | Freq: Four times a day (QID) | ORAL | 0 refills | Status: AC | PRN

## 2021-04-29 MED ORDER — IPRATROPIUM-ALBUTEROL 0.5-2.5 (3) MG/3ML IN SOLN
3.0000 mL | Freq: Once | RESPIRATORY_TRACT | Status: AC
  Administered 2021-04-29: 3 mL via RESPIRATORY_TRACT

## 2021-04-29 MED ORDER — PREDNISONE 10 MG (21) PO TBPK: ORAL_TABLET | ORAL | 0 refills | Status: DC

## 2021-04-29 MED ORDER — IPRATROPIUM-ALBUTEROL 0.5-2.5 (3) MG/3ML IN SOLN
3.0000 mL | Freq: Once | RESPIRATORY_TRACT | Status: AC
  Administered 2021-04-29: 3 mL via RESPIRATORY_TRACT
  Filled 2021-04-29: qty 3

## 2021-04-29 MED ORDER — METHYLPREDNISOLONE SODIUM SUCC 125 MG IJ SOLR
125.0000 mg | Freq: Once | INTRAMUSCULAR | Status: AC
  Administered 2021-04-29: 125 mg via INTRAVENOUS
  Filled 2021-04-29: qty 2

## 2021-04-29 NOTE — ED Notes (Signed)
Pt ambulated in hallway at this time. Her O2 sats were 98 to 100% the entire walk.

## 2021-04-29 NOTE — ED Provider Notes (Signed)
[facility] Emergency Department Provider Note   ____________________________________________   I have reviewed the triage vital signs and the nursing notes.   HISTORY  Chief Complaint Shortness of Breath and Chest Pain   History limited by: Not Limited   HPI Kelly [name] is a 55 y.o. female who presents to the emergency department today because of concern for shortness of breath, cough and chest pain. The patient states that for the past few months she has been having issues with cough and shortness of breath. It has been getting slightly worse. Over the past couple of days the patient has developed some chest pain. Located in the center of her chest. The patient denies any bloody cough. Did feel warm today. States she has a history of breathing difficulties in the past and was prescribed a nebulizer roughly 5 years ago.    Records reviewed. Per medical record review patient has a history of acid reflux.   Past Medical History:  Diagnosis Date   Acid reflux    Anxiety    Hypercholesteremia    Myocardial infarction (HCC)    Schizophrenia (HCC)    Seizures (HCC)     Patient Active Problem List   Diagnosis Date Noted   Left-sided weakness 07/11/2018   Schizoaffective disorder (HCC) 04/08/2016   Acute anxiety 04/08/2016    Past Surgical History:  Procedure Laterality Date   CARDIAC CATHETERIZATION     CESAREAN SECTION     DILATION AND CURETTAGE OF UTERUS     FOOT SURGERY     TONSILLECTOMY      Prior to Admission medications   Medication Sig Start Date End Date Taking? Authorizing Provider  atorvastatin (LIPITOR) 40 MG tablet Take 40 mg by mouth at bedtime. 10/22/19   [provider]  BREZTRI AEROSPHERE 160-9-4.8 MCG/ACT AERO Inhale 2 puffs into the lungs 2 (two) times daily. 10/19/19   [provider]  Cholecalciferol (VITAMIN D3) 50000 units CAPS TAKE ONE CAPSULE BY MOUTH ONE TIME PER WEEK 01/05/18   [provider]   cloNIDine (CATAPRES) 0.1 MG tablet Take by mouth. 10/11/18   [provider]  lamoTRIgine (LAMICTAL) 200 MG tablet Take 1 tablet (200 mg total) by mouth 2 (two) times daily. 07/13/18   Enid Baas, MD  losartan (COZAAR) 100 MG tablet Take 100 mg by mouth daily. 09/25/19   [provider]  montelukast (SINGULAIR) 10 MG tablet Take 10 mg by mouth at bedtime.  05/04/18   [provider]  nortriptyline (PAMELOR) 10 MG capsule Start Nortriptyline (Pamelor) 10 mg nightly for one week, then increase to 20 mg nightly 11/27/19   [provider]  omeprazole (PRILOSEC) 40 MG capsule Take 40 mg by mouth daily. 11/10/19   [provider]  VENTOLIN HFA 108 (90 Base) MCG/ACT inhaler Inhale 2 puffs into the lungs every 6 (six) hours as needed. 10/05/19   [provider]  VIMPAT 200 MG TABS tablet Take 200 mg by mouth 2 (two) times daily.  02/12/16   [provider]    Allergies Patient has no known allergies.  Family History  Problem Relation Age of Onset   Breast cancer Mother    Diabetes Maternal Aunt    Ovarian cancer Neg Hx    Colon cancer Neg Hx     Social History Social History   Tobacco Use   Smoking status: Former    Years: 15.00    Pack years: 0.00    Types: Cigarettes  Quit date: 2017    Years since quitting: 5.4   Smokeless tobacco: Former  Building services engineer Use: Never used  Substance Use Topics   Alcohol use: Not Currently    Comment: rare   Drug use: Not Currently    Types: Marijuana    Comment: ocas    Review of Systems Constitutional: Positive for subjective fever.  Eyes: No visual changes. ENT: No sore throat. Cardiovascular: Positive for chest pain. Respiratory: Positive for shortness of breath. Gastrointestinal: No abdominal pain.  No nausea, no vomiting.  No diarrhea.   Genitourinary: Negative for dysuria. Musculoskeletal: Negative for back pain. Skin: Negative for rash. Neurological:  Negative for headaches, focal weakness or numbness.  ____________________________________________   PHYSICAL EXAM:  VITAL SIGNS: ED Triage Vitals  Enc Vitals Group     BP 04/29/21 1316 (!) 161/87     Pulse Rate 04/29/21 1316 87     Resp 04/29/21 1316 (!) 21     Temp 04/29/21 1316 98.3 F (36.8 C)     Temp Source 04/29/21 1316 Oral     SpO2 04/29/21 1316 92 %     Weight --      Height --      Head Circumference --      Peak Flow --      Pain Score 04/29/21 1318 8   Constitutional: Alert and oriented.  Eyes: Conjunctivae are normal.  ENT      Head: Normocephalic and atraumatic.      Nose: No congestion/rhinnorhea.      Mouth/Throat: Mucous membranes are moist.      Neck: No stridor. Hematological/Lymphatic/Immunilogical: No cervical lymphadenopathy. Cardiovascular: Normal rate, regular rhythm.  No murmurs, rubs, or gallops.  Respiratory: Normal respiratory effort without tachypnea nor retractions. Diffuse expiratory wheezing.  Gastrointestinal: Soft and non tender. No rebound. No guarding.  Genitourinary: Deferred Musculoskeletal: Normal range of motion in all extremities. No lower extremity edema. Neurologic:  Normal speech and language. No gross focal neurologic deficits are appreciated.  Skin:  Skin is warm, dry and intact. No rash noted. Psychiatric: Mood and affect are normal. Speech and behavior are normal. Patient exhibits appropriate insight and judgment.  ____________________________________________    LABS (pertinent positives/negatives)  Trop hs 6 CBC wbc 12.6, hgb 11.1, plt 288 BMP wnl except glu 105  ____________________________________________   EKG  I, Phineas Semen, attending physician, personally viewed and interpreted this EKG  EKG Time: 1316 Rate: 85 Rhythm: normal sinus rhythm Axis: normal Intervals: qtc 433 QRS: narrow, q waves III ST changes: no st elevation Impression: abnormal  ekg   ____________________________________________    RADIOLOGY  CXR No new focal abnormality.   ____________________________________________   PROCEDURES  Procedures  ____________________________________________   INITIAL IMPRESSION / ASSESSMENT AND PLAN / ED COURSE  Pertinent labs & imaging results that were available during my care of the patient were reviewed by me and considered in my medical decision making (see chart for details).   Patient presented to the emergency department today because of concern for chest pain, shortness of breath. On exam patient had diffuse expiratory wheezing. Cxr without pneumonia. Patient did feel better after steroid and breathing treatments. At this time I do think likely bronchitis type pathology. Patient did ambulate without any difficulty or desaturation. Will plan on discharging with further steroids. Patient states she has plenty of medication at home for her nebulizer.   ____________________________________________   FINAL CLINICAL IMPRESSION(S) / ED DIAGNOSES  Final diagnoses:  Shortness of  breath  Cough  Chest pain, unspecified type     Note: This dictation was prepared with Dragon dictation. Any transcriptional errors that result from this process are unintentional     Phineas Semen, MD 04/29/21 [phone]

## 2021-04-29 NOTE — Discharge Instructions (Addendum)
Please seek medical attention for any high fevers, chest pain, shortness of breath, change in behavior, persistent vomiting, bloody stool or any other new or concerning symptoms.  

## 2021-04-29 NOTE — ED Triage Notes (Signed)
Pt sent from [facility] with c/o increased SOB with congested cough for the past 3 weeks and now having pain across her chest, pt is tachypneic on arrival.

## 2021-08-11 ENCOUNTER — Other Ambulatory Visit: Payer: Self-pay | Admitting: Family Medicine

## 2021-08-11 DIAGNOSIS — R9431 Abnormal electrocardiogram [ECG] [EKG]: Secondary | ICD-10-CM

## 2021-08-20 ENCOUNTER — Other Ambulatory Visit: Payer: Self-pay

## 2021-08-20 ENCOUNTER — Ambulatory Visit
Admission: RE | Admit: 2021-08-20 | Discharge: 2021-08-20 | Disposition: A | Discharge status: Home / Self Care | Payer: Medicare HMO | Source: Ambulatory Visit | Attending: Family Medicine | Admitting: Family Medicine

## 2021-08-20 DIAGNOSIS — I252 Old myocardial infarction: Secondary | ICD-10-CM | POA: Insufficient documentation

## 2021-08-20 DIAGNOSIS — R9431 Abnormal electrocardiogram [ECG] [EKG]: Secondary | ICD-10-CM | POA: Diagnosis present

## 2021-08-20 DIAGNOSIS — F419 Anxiety disorder, unspecified: Secondary | ICD-10-CM | POA: Insufficient documentation

## 2021-08-20 LAB — ECHOCARDIOGRAM COMPLETE
AR max vel: 2.1 cm2
AV Area VTI: 2.25 cm2
AV Area mean vel: 1.77 cm2
AV Mean grad: 3.5 mmHg
AV Peak grad: 6.4 mmHg
Ao pk vel: 1.26 m/s
Area-P 1/2: 3.97 cm2
S' Lateral: 2.85 cm

## 2021-08-20 NOTE — Progress Notes (Signed)
*  PRELIMINARY RESULTS* Echocardiogram 2D Echocardiogram has been performed.  Cristela Blue 08/20/2021, 10:46 AM

## 2021-11-26 ENCOUNTER — Emergency Department
Admission: EM | Admit: 2021-11-26 | Discharge: 2021-11-26 | Disposition: A | Discharge status: Home / Self Care | Payer: Medicare Other | Attending: Emergency Medicine | Admitting: Emergency Medicine

## 2021-11-26 ENCOUNTER — Encounter: Payer: Self-pay | Admitting: Emergency Medicine

## 2021-11-26 ENCOUNTER — Other Ambulatory Visit: Payer: Self-pay

## 2021-11-26 ENCOUNTER — Emergency Department: Payer: Medicare Other

## 2021-11-26 DIAGNOSIS — I10 Essential (primary) hypertension: Secondary | ICD-10-CM | POA: Diagnosis not present

## 2021-11-26 DIAGNOSIS — J45909 Unspecified asthma, uncomplicated: Secondary | ICD-10-CM | POA: Insufficient documentation

## 2021-11-26 DIAGNOSIS — R569 Unspecified convulsions: Secondary | ICD-10-CM | POA: Diagnosis present

## 2021-11-26 LAB — CBC WITH DIFFERENTIAL/PLATELET
Abs Immature Granulocytes: 0.08 10*3/uL — ABNORMAL HIGH (ref 0.00–0.07)
Basophils Absolute: 0.1 10*3/uL (ref 0.0–0.1)
Basophils Relative: 0 %
Eosinophils Absolute: 0.2 10*3/uL (ref 0.0–0.5)
Eosinophils Relative: 2 %
HCT: 36.7 % (ref 36.0–46.0)
Hemoglobin: 11.8 g/dL — ABNORMAL LOW (ref 12.0–15.0)
Immature Granulocytes: 1 %
Lymphocytes Relative: 28 %
Lymphs Abs: 3.1 10*3/uL (ref 0.7–4.0)
MCH: 26.4 pg (ref 26.0–34.0)
MCHC: 32.2 g/dL (ref 30.0–36.0)
MCV: 82.1 fL (ref 80.0–100.0)
Monocytes Absolute: 0.5 10*3/uL (ref 0.1–1.0)
Monocytes Relative: 4 %
Neutro Abs: 7.3 10*3/uL (ref 1.7–7.7)
Neutrophils Relative %: 65 %
Platelets: 268 10*3/uL (ref 150–400)
RBC: 4.47 MIL/uL (ref 3.87–5.11)
RDW: 17.5 % — ABNORMAL HIGH (ref 11.5–15.5)
WBC: 11.2 10*3/uL — ABNORMAL HIGH (ref 4.0–10.5)
nRBC: 0 % (ref 0.0–0.2)

## 2021-11-26 LAB — COMPREHENSIVE METABOLIC PANEL
ALT: 25 U/L (ref 0–44)
AST: 21 U/L (ref 15–41)
Albumin: 3.9 g/dL (ref 3.5–5.0)
Alkaline Phosphatase: 85 U/L (ref 38–126)
Anion gap: 5 (ref 5–15)
BUN: 20 mg/dL (ref 6–20)
CO2: 29 mmol/L (ref 22–32)
Calcium: 9 mg/dL (ref 8.9–10.3)
Chloride: 106 mmol/L (ref 98–111)
Creatinine, Ser: 0.73 mg/dL (ref 0.44–1.00)
GFR, Estimated: >60 mL/min (ref >60)
Glucose, Bld: 98 mg/dL (ref 70–99)
Potassium: 4.5 mmol/L (ref 3.5–5.1)
Sodium: 140 mmol/L (ref 135–145)
Total Bilirubin: 0.3 mg/dL (ref 0.3–1.2)
Total Protein: 7.7 g/dL (ref 6.5–8.1)

## 2021-11-26 MED ORDER — LACOSAMIDE 50 MG PO TABS
200.0000 mg | ORAL_TABLET | Freq: Once | ORAL | Status: AC
  Administered 2021-11-26: 200 mg via ORAL
  Filled 2021-11-26: qty 4

## 2021-11-26 MED ORDER — LAMOTRIGINE 100 MG PO TABS
200.0000 mg | ORAL_TABLET | Freq: Once | ORAL | Status: AC
  Administered 2021-11-26: 200 mg via ORAL
  Filled 2021-11-26: qty 2

## 2021-11-26 MED ORDER — ACETAMINOPHEN 500 MG PO TABS: 1000.0000 mg | ORAL_TABLET | Freq: Once | ORAL | Status: DC

## 2021-11-26 MED ORDER — DIVALPROEX SODIUM 250 MG PO DR TAB
250.0000 mg | DELAYED_RELEASE_TABLET | Freq: Once | ORAL | Status: AC
  Administered 2021-11-26: 250 mg via ORAL
  Filled 2021-11-26: qty 1

## 2021-11-26 NOTE — ED Triage Notes (Signed)
Pt comes into the ED via [facility] from home c/o seizure.  H/o epilepsy and the patient was walking outside when a bystander saw her drop to the ground and have the seizure.  Pt was postictal upon EMS arrival.  Pt still slow to answer questions.  No access established at this time.   138/69 100% RA 96 Hr 125 CBG

## 2021-11-26 NOTE — ED Notes (Signed)
Pt walked out of room heading towards hall, this RN asked this pt to wait in the room until their paperwork was ready, pt stated they would be right back and kept walking.

## 2021-11-26 NOTE — ED Provider Notes (Signed)
[facility] Provider Note    Event Date/Time   First MD Initiated Contact with Patient 11/26/21 1805     (approximate)   History   Chief Complaint Seizures   HPI  Kelly [name] is a 56 y.o. female with past medical history of hypertension, hyperlipidemia, asthma, seizures, and schizoaffective disorder who presents to the ED for possible seizure.  Per EMS, bystander witnessed her fall to the ground with generalized tonic-clonic seizure activity.  It is unclear how long the seizure activity lasted but EMS reports that patient appeared postictal on their arrival.  Patient is now answering questions appropriately, states that she felt like she had a seizure and still feels "groggy."  She complains of a feeling of swelling and pain around the right side of her face, believes she hit her head on concrete.  She does not take any blood thinners and denies any numbness or weakness.  She reports being compliant with her Lamictal and Vimpat dosing.     Physical Exam   Triage Vital Signs: ED Triage Vitals  Enc Vitals Group     BP 11/26/21 1426 (!) 144/71     Pulse Rate 11/26/21 1426 77     Resp 11/26/21 1426 20     Temp 11/26/21 1426 98 F (36.7 C)     Temp Source 11/26/21 1426 Oral     SpO2 11/26/21 1426 97 %     Weight 11/26/21 1428 250 lb (113.4 kg)     Height 11/26/21 1428 5\' 5"  (1.651 m)     Head Circumference --      Peak Flow --      Pain Score 11/26/21 1427 3     Pain Loc --      Pain Edu? --      Excl. in [place]? --     Most recent vital signs: Vitals:   11/26/21 1426  BP: (!) 144/71  Pulse: 77  Resp: 20  Temp: 98 F (36.7 C)  SpO2: 97%    Constitutional: Alert and oriented. Eyes: Conjunctivae are normal. Head: Atraumatic. Nose: No congestion/rhinnorhea. Mouth/Throat: Mucous membranes are moist.  Neck: No midline cervical spine tenderness to palpation. Cardiovascular: Normal rate, regular rhythm. Grossly normal heart sounds.  2+ radial  pulses bilaterally. Respiratory: Normal respiratory effort.  No retractions. Lungs CTAB. Gastrointestinal: Soft and nontender. No distention. Genitourinary: Deferred. Musculoskeletal: No lower extremity tenderness nor edema.  Neurologic:  Normal speech and language. No gross focal neurologic deficits are appreciated.    ED Results / Procedures / Treatments   Labs (all labs ordered are listed, but only abnormal results are displayed) Labs Reviewed  CBC WITH DIFFERENTIAL/PLATELET - Abnormal; Notable for the following components:      Result Value   WBC 11.2 (*)    Hemoglobin 11.8 (*)    RDW 17.5 (*)    Abs Immature Granulocytes 0.08 (*)    All other components within normal limits  COMPREHENSIVE METABOLIC PANEL     RADIOLOGY CT head reviewed by me with no obvious hemorrhage or infarct, negative for acute process per radiology.  Right shoulder x-ray reviewed by me with no obvious fracture or dislocation.  PROCEDURES:  Critical Care performed: No  Procedures   MEDICATIONS ORDERED IN ED: Medications  acetaminophen (TYLENOL) tablet 1,000 mg (1,000 mg Oral Patient Refused/Not Given 11/26/21 1841)  lamoTRIgine (LAMICTAL) tablet 200 mg (200 mg Oral Given 11/26/21 1819)  lacosamide (VIMPAT) tablet 200 mg (200 mg Oral Given 11/26/21 1819)  divalproex (DEPAKOTE) DR tablet 250 mg (250 mg Oral Given 11/26/21 1819)     IMPRESSION / MDM / ASSESSMENT AND PLAN / ED COURSE  I reviewed the triage vital signs and the nursing notes.                              56 y.o. female with past medical history of hypertension, hyperlipidemia, asthma, seizures, and schizophrenia who presents to the ED following seizure episode witnessed by bystander, found to be postictal by EMS.  Differential diagnosis includes, but is not limited to, seizure, electrolyte abnormality, intracranial injury, cervical spine injury.  Patient is well-appearing and in no acute distress on my assessment, no longer appears  postictal.  She does have a history of seizures and reports compliance with medications, although has not yet had her evening dose.  CT head and cervical spine were obtained from triage and negative for acute process, CBC and BMP are unremarkable with no anemia or electrolyte abnormality.  I discussed plan with patient for observation here in the ED while she receives her evening dose of seizure medication, patient was initially agreeable to this plan.  Shortly afterwards, she became upset and said that if we were not going to do anything for her she would simply go home.  She eloped from the emergency department before I was able to have further discussion with her.       FINAL CLINICAL IMPRESSION(S) / ED DIAGNOSES   Final diagnoses:  Seizure ([place])     Rx / DC Orders   ED Discharge Orders     None        Note:  This document was prepared using Dragon voice recognition software and may include unintentional dictation errors.   Blake Divine, MD 11/26/21 2337

## 2021-11-26 NOTE — ED Notes (Signed)
This pt came out of room at this time and stated they were ready for their papers and their neighbor was here. This RN stated the MD had to get paperwork written up and then this RN would bring them to this pt when they were ready and requested this pt wait back in their room. Pt went back into room and sat on bench.

## 2021-11-26 NOTE — ED Notes (Signed)
Pt requesting tylenol stating "the numbness in my face is making it hurt, can you give me some tylenol for that?". Verbal order received by MD Larinda Buttery for 1G of tylenol.

## 2021-11-26 NOTE — ED Provider Triage Note (Signed)
Emergency Medicine Provider Triage Evaluation Note  Kelly [name] , a 56 y.o. female  was evaluated in triage.  Pt complains of seizure, brought in by EMS, hit head on concrete, also right shoulder pain.  Review of Systems  Positive: Seizure, right shoulder pain, headache, head injury Negative: Vomiting, fever  Physical Exam  BP (!) 144/71 (BP Location: Right Arm)    Pulse 77    Temp 98 F (36.7 C) (Oral)    Resp 20    Ht 5\' 5"  (1.651 m)    Wt 113.4 kg    SpO2 97%    BMI 41.60 kg/m  Gen:   Awake, no distress   Resp:  Normal effort  MSK:   Moves extremities without difficulty  Other:    Medical Decision Making  Medically screening exam initiated at 2:37 PM.  Appropriate orders placed.  Sloane Huddleston was informed that the remainder of the evaluation will be completed by another provider, this initial triage assessment does not replace that evaluation, and the importance of remaining in the ED until their evaluation is complete.     Versie Starks, PA-C 11/26/21 1437

## 2021-11-26 NOTE — ED Notes (Signed)
This RN went into this pt's room to administer the tylenol they requested and to draw valproic acid level, pt asked "you're drawing more blood? What for?" This RN explained the blood level we were drawing for and this pt stated "Then just let me go home, I have my own meds at home. I'll call my neighbor and get her to come get me. I can put on depends and sit in my own bed and piss in my own bed. I don't need to be kept here like a prisoner for that." This RN asked if this pt needed to use the bathroom and asked what was upsetting this pt, this pt stated "if no doctor is going to come see me, then I'm not going to just sit around here for nothing". This RN explained MD Charna Archer was in this room earlier and that we needed to monitor this pt post medication administration for any further seizure activity. This pt asked if this RN was "fucking thick or just can't hear" and stated "I'm going. Get me the paperwork now". This RN left the room and stated they would inform MD about pt's desire to leave. MD informed at this time.

## 2021-11-26 NOTE — ED Notes (Signed)
Pt refusing vitals at this time.

## 2021-12-20 ENCOUNTER — Emergency Department
Admission: EM | Admit: 2021-12-20 | Discharge: 2021-12-20 | Disposition: A | Discharge status: Home / Self Care | Payer: Medicare Other | Attending: Emergency Medicine | Admitting: Emergency Medicine

## 2021-12-20 DIAGNOSIS — J45909 Unspecified asthma, uncomplicated: Secondary | ICD-10-CM | POA: Diagnosis not present

## 2021-12-20 DIAGNOSIS — I10 Essential (primary) hypertension: Secondary | ICD-10-CM | POA: Insufficient documentation

## 2021-12-20 DIAGNOSIS — R569 Unspecified convulsions: Secondary | ICD-10-CM | POA: Insufficient documentation

## 2021-12-20 DIAGNOSIS — R4182 Altered mental status, unspecified: Secondary | ICD-10-CM | POA: Diagnosis present

## 2021-12-20 LAB — CBC
HCT: 42.2 % (ref 36.0–46.0)
Hemoglobin: 13.1 g/dL (ref 12.0–15.0)
MCH: 25.7 pg — ABNORMAL LOW (ref 26.0–34.0)
MCHC: 31 g/dL (ref 30.0–36.0)
MCV: 82.7 fL (ref 80.0–100.0)
Platelets: 243 10*3/uL (ref 150–400)
RBC: 5.1 MIL/uL (ref 3.87–5.11)
RDW: 17.4 % — ABNORMAL HIGH (ref 11.5–15.5)
WBC: 11.5 10*3/uL — ABNORMAL HIGH (ref 4.0–10.5)
nRBC: 0 % (ref 0.0–0.2)

## 2021-12-20 LAB — BASIC METABOLIC PANEL
Anion gap: 4 — ABNORMAL LOW (ref 5–15)
BUN: 22 mg/dL — ABNORMAL HIGH (ref 6–20)
CO2: 29 mmol/L (ref 22–32)
Calcium: 9.5 mg/dL (ref 8.9–10.3)
Chloride: 103 mmol/L (ref 98–111)
Creatinine, Ser: 0.96 mg/dL (ref 0.44–1.00)
GFR, Estimated: >60 mL/min (ref >60)
Glucose, Bld: 95 mg/dL (ref 70–99)
Potassium: 5.3 mmol/L — ABNORMAL HIGH (ref 3.5–5.1)
Sodium: 136 mmol/L (ref 135–145)

## 2021-12-20 NOTE — ED Triage Notes (Signed)
Outside her apt, guy called said she was there 15 minutes altered, 4 mg narcan nasal, pt became more responsive , pressure 150/67

## 2021-12-20 NOTE — ED Provider Notes (Signed)
[facility] Provider Note    Event Date/Time   First MD Initiated Contact with Patient 12/20/21 1306     (approximate)   History   Altered mental status  HPI  Kelly [name] is a 56 y.o. female with a past medical history of hypertension asthma, seizures, schizoaffective disorder who presents with altered mental status.  EMS reports a bystander noticed her to be acting unusually outside.  EMS did give Narcan which they think may have helped her symptoms.  However on review of records patient has a history of seizure disorder and has presented in similar fashion in the past after a seizure and postictal period      Physical Exam   Triage Vital Signs: ED Triage Vitals  Enc Vitals Group     BP 12/20/21 1307 (!) 150/87     Pulse Rate 12/20/21 1307 100     Resp 12/20/21 1307 17     Temp 12/20/21 1307 98.1 F (36.7 C)     Temp Source 12/20/21 1307 Oral     SpO2 12/20/21 1307 98 %     Weight 12/20/21 1308 95 kg (209 lb 7 oz)     Height 12/20/21 1308 1.702 m (5\' 7" )     Head Circumference --      Peak Flow --      Pain Score 12/20/21 1308 0     Pain Loc --      Pain Edu? --      Excl. in [place]? --     Most recent vital signs: Vitals:   12/20/21 1307 12/20/21 1448  BP: (!) 150/87 (!) 151/74  Pulse: 100 89  Resp: 17 17  Temp: 98.1 F (36.7 C) 98.6 F (37 C)  SpO2: 98% 98%     General: Awake, no distress.  Slow to respond but is answering questions appropriately CV:  Good peripheral perfusion.  Regular rate and rhythm Resp:  Normal effort.  CTA bilaterally Abd:  No distention.  No tenderness palpation Other:  No extremity injuries noted, PERRLA, EOMI, normal strength in all extremities   ED Results / Procedures / Treatments   Labs (all labs ordered are listed, but only abnormal results are displayed) Labs Reviewed  CBC - Abnormal; Notable for the following components:      Result Value   WBC 11.5 (*)    MCH 25.7 (*)    RDW 17.4 (*)     All other components within normal limits  BASIC METABOLIC PANEL - Abnormal; Notable for the following components:   Potassium 5.3 (*)    BUN 22 (*)    Anion gap 4 (*)    All other components within normal limits     EKG     RADIOLOGY     PROCEDURES:  Critical Care performed:   .1-3 Lead EKG Interpretation Performed by: Lavonia Drafts, MD Authorized by: Lavonia Drafts, MD     Interpretation: normal     ECG rate assessment: normal     Rhythm: sinus rhythm     Ectopy: none     Conduction: normal     MEDICATIONS ORDERED IN ED: Medications - No data to display   IMPRESSION / MDM / [place] / ED COURSE  I reviewed the triage vital signs and the nursing notes.   Patient presents with altered mental status as detailed above.  This appears consistent with postictal state status postseizure given review of records.  Pupils are normal, not consistent  with drug overdose.  Patient appears to gradually be improving  Will check labs, placed in the cardiac monitor and observe in the emergency department  ----------------------------------------- 3:02 PM on 12/20/2021 ----------------------------------------- Patient states "I just had a seizure I want to go home ""I do not want to be here "  She appears to be at her baseline, is no longer willing to stay for further observation, appropriate discharge this time         FINAL CLINICAL IMPRESSION(S) / ED DIAGNOSES   Final diagnoses:  Seizure ([place])     Rx / DC Orders   ED Discharge Orders     None        Note:  This document was prepared using Dragon voice recognition software and may include unintentional dictation errors.   Lavonia Drafts, MD 12/20/21 [id number]

## 2022-02-12 ENCOUNTER — Emergency Department
Admission: EM | Admit: 2022-02-12 | Discharge: 2022-02-12 | Disposition: A | Discharge status: Home / Self Care | Payer: Medicare Other | Attending: Emergency Medicine | Admitting: Emergency Medicine

## 2022-02-12 ENCOUNTER — Encounter: Payer: Self-pay | Admitting: Emergency Medicine

## 2022-02-12 ENCOUNTER — Other Ambulatory Visit: Payer: Self-pay

## 2022-02-12 DIAGNOSIS — G40909 Epilepsy, unspecified, not intractable, without status epilepticus: Secondary | ICD-10-CM | POA: Diagnosis not present

## 2022-02-12 DIAGNOSIS — R569 Unspecified convulsions: Secondary | ICD-10-CM | POA: Diagnosis present

## 2022-02-12 LAB — BASIC METABOLIC PANEL
Anion gap: 7 (ref 5–15)
BUN: 24 mg/dL — ABNORMAL HIGH (ref 6–20)
CO2: 28 mmol/L (ref 22–32)
Calcium: 9.3 mg/dL (ref 8.9–10.3)
Chloride: 102 mmol/L (ref 98–111)
Creatinine, Ser: 1.04 mg/dL — ABNORMAL HIGH (ref 0.44–1.00)
GFR, Estimated: >60 mL/min (ref >60)
Glucose, Bld: 113 mg/dL — ABNORMAL HIGH (ref 70–99)
Potassium: 4 mmol/L (ref 3.5–5.1)
Sodium: 137 mmol/L (ref 135–145)

## 2022-02-12 LAB — VALPROIC ACID LEVEL: Valproic Acid Lvl: 36 ug/mL — ABNORMAL LOW (ref 50.0–100.0)

## 2022-02-12 LAB — CBC WITH DIFFERENTIAL/PLATELET
Abs Immature Granulocytes: 0.06 10*3/uL (ref 0.00–0.07)
Basophils Absolute: 0.1 10*3/uL (ref 0.0–0.1)
Basophils Relative: 0 %
Eosinophils Absolute: 0.2 10*3/uL (ref 0.0–0.5)
Eosinophils Relative: 1 %
HCT: 39.4 % (ref 36.0–46.0)
Hemoglobin: 12.3 g/dL (ref 12.0–15.0)
Immature Granulocytes: 1 %
Lymphocytes Relative: 40 %
Lymphs Abs: 4.8 10*3/uL — ABNORMAL HIGH (ref 0.7–4.0)
MCH: 25.8 pg — ABNORMAL LOW (ref 26.0–34.0)
MCHC: 31.2 g/dL (ref 30.0–36.0)
MCV: 82.6 fL (ref 80.0–100.0)
Monocytes Absolute: 0.6 10*3/uL (ref 0.1–1.0)
Monocytes Relative: 5 %
Neutro Abs: 6.3 10*3/uL (ref 1.7–7.7)
Neutrophils Relative %: 53 %
Platelets: 279 10*3/uL (ref 150–400)
RBC: 4.77 MIL/uL (ref 3.87–5.11)
RDW: 18 % — ABNORMAL HIGH (ref 11.5–15.5)
WBC: 11.9 10*3/uL — ABNORMAL HIGH (ref 4.0–10.5)
nRBC: 0 % (ref 0.0–0.2)

## 2022-02-12 NOTE — ED Provider Notes (Signed)
? ?[facility] ?Provider Note ? ? ? Event Date/Time  ? First MD Initiated Contact with Patient 02/12/22 1121   ?  (approximate) ? ? ?History  ? ?Seizures ? ? ?HPI ? ?Kelly [name] is a 56 y.o. female  who, per neurology note dated 12/01/2021 has history of  seizures on depakote, lamictal, vimpat, who presents to the emergency department today because of concern for possible seizure. The patient states she was at [facility] when she felt like something was off. She was then seen to have a seizure. She does have history of seizures and states that she has them a couple of times a month. She does state she has been taking her medications. She denies any pain.   ? ? ?Physical Exam  ? ?Triage Vital Signs: ?ED Triage Vitals  ?Enc Vitals Group  ?   BP 02/12/22 1116 138/72  ?   Pulse Rate 02/12/22 1116 88  ?   Resp 02/12/22 1116 18  ?   Temp 02/12/22 1116 (!) 97.5 ?F (36.4 ?C)  ?   Temp Source 02/12/22 1116 Oral  ?   SpO2 02/12/22 1111 97 %  ?   Weight 02/12/22 1114 213 lb (96.6 kg)  ?   Height 02/12/22 1114 5\' 7"  (1.702 m)  ?   Head Circumference --   ?   Peak Flow --   ?   Pain Score 02/12/22 1114 0  ?   Pain Loc --   ?   Pain Edu? --   ?   Excl. in [place]? --   ? ? ?Most recent vital signs: ?Vitals:  ? 02/12/22 1111 02/12/22 1116  ?BP:  138/72  ?Pulse:  88  ?Resp:  18  ?Temp:  (!) 97.5 ?F (36.4 ?C)  ?SpO2: 97% 97%  ? ? ?General: Awake, no distress.  ?CV:  Good peripheral perfusion.  ?Resp:  Normal effort.  ?Abd:  No distention.  ?Neuro:  Awake, alert, oriented. Moving all extremities. Strength 5/5 in upper and lower extremities.  ? ? ?ED Results / Procedures / Treatments  ? ?Labs ?(all labs ordered are listed, but only abnormal results are displayed) ?Labs Reviewed  ?CBC WITH DIFFERENTIAL/PLATELET - Abnormal; Notable for the following components:  ?    Result Value  ? WBC 11.9 (*)   ? MCH 25.8 (*)   ? RDW 18.0 (*)   ? Lymphs Abs 4.8 (*)   ? All other components within normal limits  ?BASIC METABOLIC PANEL  - Abnormal; Notable for the following components:  ? Glucose, Bld 113 (*)   ? BUN 24 (*)   ? Creatinine, Ser 1.04 (*)   ? All other components within normal limits  ?VALPROIC ACID LEVEL - Abnormal; Notable for the following components:  ? Valproic Acid Lvl 36 (*)   ? All other components within normal limits  ? ? ? ?EKG ? ?I04/01/23, attending physician, personally viewed and interpreted this EKG ? ?EKG Time: 1115 ?Rate: 90 ?Rhythm: sinus rhythm ?Axis: normal ?Intervals: qtc 435 ?QRS: narrow ?ST changes: no st elevation ?Impression: normal ekg ? ?RADIOLOGY ?None ? ? ? ?PROCEDURES: ? ?Critical Care performed: No ? ?Procedures ? ? ?MEDICATIONS ORDERED IN ED: ?Medications - No data to display ? ? ?IMPRESSION / MDM / ASSESSMENT AND PLAN / ED COURSE  ?I reviewed the triage vital signs and the nursing notes. ?             ?               ? ?  Differential diagnosis includes, but is not limited to, seizure, medication non compliance, electrolyte abnormality. ? ?Patient presented to the emergency department today because of concern for seizure. Patient is awake and alert. No focal neuro deficits. Blood work without concerning electrolyte abnormalities. Patient valproic acid level was slightly low. Discussed this with the patient. Did encourage patient to follow up with PCP to discuss medication adjustment. Will plan on discharging home. ? ? ?FINAL CLINICAL IMPRESSION(S) / ED DIAGNOSES  ? ?Final diagnoses:  ?Seizure (HCC)  ? ? ? ? ?Note:  This document was prepared using Dragon voice recognition software and may include unintentional dictation errors. ? ?  ?Phineas Semen, MD ?02/12/22 1544 ? ?

## 2022-02-12 NOTE — Discharge Instructions (Signed)
Please seek medical attention for any high fevers, chest pain, shortness of breath, change in behavior, persistent vomiting, bloody stool or any other new or concerning symptoms.  

## 2022-02-12 NOTE — ED Triage Notes (Signed)
Patient to ED via [facility] from the [facility] on [address] for a witnessed seizure. Patient has hx of same. Patient states she has not missed any doses of medications but is unable to state the name of medication at this time. AOx2 ?

## 2022-06-22 ENCOUNTER — Other Ambulatory Visit: Payer: Self-pay | Admitting: Physician Assistant

## 2022-06-22 ENCOUNTER — Other Ambulatory Visit (HOSPITAL_COMMUNITY): Payer: Self-pay | Admitting: Physician Assistant

## 2022-06-22 DIAGNOSIS — M542 Cervicalgia: Secondary | ICD-10-CM

## 2022-06-22 DIAGNOSIS — G479 Sleep disorder, unspecified: Secondary | ICD-10-CM

## 2022-06-22 DIAGNOSIS — I10 Essential (primary) hypertension: Secondary | ICD-10-CM

## 2022-06-22 DIAGNOSIS — F39 Unspecified mood [affective] disorder: Secondary | ICD-10-CM

## 2022-06-22 DIAGNOSIS — R519 Headache, unspecified: Secondary | ICD-10-CM

## 2022-06-22 DIAGNOSIS — R2689 Other abnormalities of gait and mobility: Secondary | ICD-10-CM

## 2022-06-22 DIAGNOSIS — R569 Unspecified convulsions: Secondary | ICD-10-CM

## 2022-06-22 DIAGNOSIS — M5412 Radiculopathy, cervical region: Secondary | ICD-10-CM

## 2022-07-02 ENCOUNTER — Ambulatory Visit
Admission: RE | Admit: 2022-07-02 | Discharge: 2022-07-02 | Disposition: A | Discharge status: Home / Self Care | Payer: Medicare Other | Source: Ambulatory Visit | Attending: Physician Assistant | Admitting: Physician Assistant

## 2022-07-02 DIAGNOSIS — M5412 Radiculopathy, cervical region: Secondary | ICD-10-CM

## 2022-07-02 DIAGNOSIS — R519 Headache, unspecified: Secondary | ICD-10-CM | POA: Diagnosis present

## 2022-07-02 DIAGNOSIS — M542 Cervicalgia: Secondary | ICD-10-CM | POA: Insufficient documentation

## 2022-07-02 DIAGNOSIS — I10 Essential (primary) hypertension: Secondary | ICD-10-CM | POA: Diagnosis present

## 2022-07-02 DIAGNOSIS — F39 Unspecified mood [affective] disorder: Secondary | ICD-10-CM | POA: Diagnosis present

## 2022-07-02 DIAGNOSIS — G479 Sleep disorder, unspecified: Secondary | ICD-10-CM | POA: Diagnosis present

## 2022-07-02 DIAGNOSIS — R569 Unspecified convulsions: Secondary | ICD-10-CM

## 2022-07-02 DIAGNOSIS — R2689 Other abnormalities of gait and mobility: Secondary | ICD-10-CM

## 2022-12-14 ENCOUNTER — Emergency Department: Payer: 59

## 2022-12-14 ENCOUNTER — Emergency Department
Admission: EM | Admit: 2022-12-14 | Discharge: 2022-12-14 | Disposition: A | Discharge status: Home / Self Care | Payer: 59 | Attending: Emergency Medicine | Admitting: Emergency Medicine

## 2022-12-14 DIAGNOSIS — J209 Acute bronchitis, unspecified: Secondary | ICD-10-CM | POA: Diagnosis not present

## 2022-12-14 DIAGNOSIS — I2510 Atherosclerotic heart disease of native coronary artery without angina pectoris: Secondary | ICD-10-CM | POA: Insufficient documentation

## 2022-12-14 DIAGNOSIS — R569 Unspecified convulsions: Secondary | ICD-10-CM | POA: Diagnosis not present

## 2022-12-14 DIAGNOSIS — R911 Solitary pulmonary nodule: Secondary | ICD-10-CM | POA: Diagnosis not present

## 2022-12-14 DIAGNOSIS — Z1152 Encounter for screening for COVID-19: Secondary | ICD-10-CM | POA: Insufficient documentation

## 2022-12-14 LAB — BASIC METABOLIC PANEL
Anion gap: 10 (ref 5–15)
BUN: 19 mg/dL (ref 6–20)
CO2: 25 mmol/L (ref 22–32)
Calcium: 9.4 mg/dL (ref 8.9–10.3)
Chloride: 103 mmol/L (ref 98–111)
Creatinine, Ser: 0.76 mg/dL (ref 0.44–1.00)
GFR, Estimated: >60 mL/min (ref >60)
Glucose, Bld: 85 mg/dL (ref 70–99)
Potassium: 4.1 mmol/L (ref 3.5–5.1)
Sodium: 138 mmol/L (ref 135–145)

## 2022-12-14 LAB — RESP PANEL BY RT-PCR (RSV, FLU A&B, COVID)  RVPGX2
Influenza A by PCR: NEGATIVE
Influenza B by PCR: NEGATIVE
Resp Syncytial Virus by PCR: NEGATIVE
SARS Coronavirus 2 by RT PCR: NEGATIVE

## 2022-12-14 LAB — POC URINE PREG, ED: Preg Test, Ur: NEGATIVE

## 2022-12-14 LAB — CBC
HCT: 39.3 % (ref 36.0–46.0)
Hemoglobin: 12.5 g/dL (ref 12.0–15.0)
MCH: 26.8 pg (ref 26.0–34.0)
MCHC: 31.8 g/dL (ref 30.0–36.0)
MCV: 84.3 fL (ref 80.0–100.0)
Platelets: 261 10*3/uL (ref 150–400)
RBC: 4.66 MIL/uL (ref 3.87–5.11)
RDW: 17 % — ABNORMAL HIGH (ref 11.5–15.5)
WBC: 11.8 10*3/uL — ABNORMAL HIGH (ref 4.0–10.5)
nRBC: 0 % (ref 0.0–0.2)

## 2022-12-14 LAB — TROPONIN I (HIGH SENSITIVITY)
Troponin I (High Sensitivity): 6 ng/L (ref <18)
Troponin I (High Sensitivity): 5 ng/L (ref <18)

## 2022-12-14 LAB — VALPROIC ACID LEVEL: Valproic Acid Lvl: 86 ug/mL (ref 50.0–100.0)

## 2022-12-14 MED ORDER — LACOSAMIDE 50 MG PO TABS
200.0000 mg | ORAL_TABLET | Freq: Once | ORAL | Status: AC
  Administered 2022-12-14: 200 mg via ORAL
  Filled 2022-12-14: qty 4

## 2022-12-14 MED ORDER — IPRATROPIUM-ALBUTEROL 0.5-2.5 (3) MG/3ML IN SOLN
3.0000 mL | Freq: Once | RESPIRATORY_TRACT | Status: AC
  Administered 2022-12-14: 3 mL via RESPIRATORY_TRACT
  Filled 2022-12-14: qty 3

## 2022-12-14 MED ORDER — PREDNISONE 50 MG PO TABS: ORAL_TABLET | ORAL | 0 refills | Status: DC

## 2022-12-14 MED ORDER — DIVALPROEX SODIUM 250 MG PO DR TAB
250.0000 mg | DELAYED_RELEASE_TABLET | Freq: Once | ORAL | Status: AC
  Administered 2022-12-14: 250 mg via ORAL
  Filled 2022-12-14: qty 1

## 2022-12-14 MED ORDER — LAMOTRIGINE 100 MG PO TABS
200.0000 mg | ORAL_TABLET | Freq: Once | ORAL | Status: AC
  Administered 2022-12-14: 200 mg via ORAL
  Filled 2022-12-14: qty 2

## 2022-12-14 MED ORDER — PREDNISONE 20 MG PO TABS
60.0000 mg | ORAL_TABLET | Freq: Once | ORAL | Status: AC
Start: 2022-12-14 — End: 2022-12-14
  Administered 2022-12-14: 60 mg via ORAL
  Filled 2022-12-14: qty 3

## 2022-12-14 NOTE — Discharge Instructions (Addendum)
Please call Dr. Lannie Fields office to discuss your seizure activity and ask about any medication changes.  Please take the prednisone once daily for the next 5 days.  Please follow-up with your primary care doctor regarding your cough and wheezing.  We did see a nodule on your lungs on your x-ray, this is something that you will need to follow-up with your primary care doctor and you will need to have a CT scan to further evaluate and track.

## 2022-12-14 NOTE — ED Provider Notes (Signed)
[facility] Provider Note    Event Date/Time   First MD Initiated Contact with Patient 12/14/22 1855     (approximate)   History   Seizures   HPI  Kelly [name] is a 57 y.o. female medical history of seizures, schizophrenia, coronary artery disease who presents because of seizures.  Patient tells me that she had 3 seizures today.  She lives alone but usually knows she had a seizure because she wakes up on the ground.  She also has a feeling of dj vu prior to having a seizure and felt this.  Does tell me she has had breakthrough seizures in the past but it is a bit unusual for her to have 3 in 1 day.  Usually they occur while she is sleeping.  One of the episodes did occur while she was sleeping she knew because she urinated on herself.  Has a mild headache denies new numbness tingling weakness vision change.  She has felt short of breath for about a week and a half with nonproductive cough.  Feels like she needs to cough something up but is not able to.  She has been using albuterol inhaler for this.  She has been compliant with her antiepileptics.  Reviewed last neurology note from November 2023.  Patient is on Depakote 500 twice daily, Lamictal 200 twice daily and Vimpat 200 twice daily she is also on Seroquel for psychosis.    Past Medical History:  Diagnosis Date   Acid reflux    Anxiety    Hypercholesteremia    Myocardial infarction (HCC)    Schizophrenia (HCC)    Seizures (HCC)     Patient Active Problem List   Diagnosis Date Noted   Left-sided weakness 07/11/2018   Schizoaffective disorder (HCC) 04/08/2016   Acute anxiety 04/08/2016     Physical Exam  Triage Vital Signs: ED Triage Vitals [12/14/22 1801]  Enc Vitals Group     BP (!) 176/105     Pulse Rate 91     Resp 18     Temp 98.1 F (36.7 C)     Temp Source Oral     SpO2 94 %     Weight      Height      Head Circumference      Peak Flow      Pain Score      Pain Loc       Pain Edu?      Excl. in [place]?     Most recent vital signs: Vitals:   12/14/22 1801 12/14/22 1913  BP: (!) 176/105 102/85  Pulse: 91 85  Resp: 18 20  Temp: 98.1 F (36.7 C)   SpO2: 94% 98%     General: Awake, no distress.  CV:  Good peripheral perfusion.  Resp:  Normal effort.  Patient speaking full sentence does have diffuse expiratory wheezing but good air movement Abd:  No distention.  Neuro:             Awake, Alert, Oriented x 3  Other:  Aox3, nml speech  PERRL, EOMI, face symmetric, nml tongue movement  5/5 strength in the BL upper and lower extremities  Sensation grossly intact in the BL upper and lower extremities  Finger-nose-finger intact BL    ED Results / Procedures / Treatments  Labs (all labs ordered are listed, but only abnormal results are displayed) Labs Reviewed  CBC - Abnormal; Notable for the following components:  Result Value   WBC 11.8 (*)    RDW 17.0 (*)    All other components within normal limits  RESP PANEL BY RT-PCR (RSV, FLU A&B, COVID)  RVPGX2  BASIC METABOLIC PANEL  VALPROIC ACID LEVEL  LAMOTRIGINE LEVEL  POC URINE PREG, ED  TROPONIN I (HIGH SENSITIVITY)  TROPONIN I (HIGH SENSITIVITY)     EKG  EKG interpretation performed by myself: NSR, nml axis, nml intervals, no acute ischemic changes    RADIOLOGY I reviewed and interpreted the CXR which does not show any acute cardiopulmonary process    PROCEDURES:  Critical Care performed: No  Procedures  The patient is on the cardiac monitor to evaluate for evidence of arrhythmia and/or significant heart rate changes.   MEDICATIONS ORDERED IN ED: Medications  divalproex (DEPAKOTE) DR tablet 250 mg (has no administration in time range)  lamoTRIgine (LAMICTAL) tablet 200 mg (has no administration in time range)  lacosamide (VIMPAT) tablet 200 mg (has no administration in time range)  ipratropium-albuterol (DUONEB) 0.5-2.5 (3) MG/3ML nebulizer solution 3 mL (3 mLs Nebulization  Given 12/14/22 2001)  predniSONE (DELTASONE) tablet 60 mg (60 mg Oral Given 12/14/22 1952)     IMPRESSION / MDM / ASSESSMENT AND PLAN / ED COURSE  I reviewed the triage vital signs and the nursing notes.                              Patient's presentation is most consistent with acute complicated illness / injury requiring diagnostic workup.  Differential diagnosis includes, but is not limited to, breakthrough seizure secondary to underlying illness including viral illness, bronchitis/COPD exacerbation, medication noncompliance, electrode abnormality  The patient is a 57 year old female who presents with 3 seizures at home today as well as shortness of breath for a week and a half.  She has history of seizure disorder takes Vimpat Depakote and Lamictal.  She lives alone so this was not witnessed but tells me that she thinks she had 3 seizures 1 occurred when she was in bed and she had wet herself which frequently occurs and twice she found herself on the floor.  Denies headache new numbness tingling weakness.  Tells me she is been compliant with her medications.  Her only other symptom is she has had shortness of breath and some cough over the last week and a half.  She has been using an albuterol inhaler.  Does have remote history of smoking no formal diagnosis of COPD that she is aware of.  Denies chest pain lower extremity swelling.  On exam she does have audible wheezing but is speaking in full sentences there is wheezing on auscultation but good air movement.  Neurologic exam is nonfocal.  Vitals are reassuring.  Chest x-ray obtained shows a lung nodule which she will need to follow-up about but no other acute findings.  Did obtain a CT head given she found herself on the floor twice and this is negative for any traumatic injuries or anything acute.  Labs are overall reassuring she does have a mild leukocytosis.  Did check Depakote and lamotrigine level.  Depakote level resulted therapeutic.  I  did give her her evening dose of Depakote Lamictal and Vimpat.  Recommend she call Dr. Lannie Fields office tomorrow to discuss any possible medication dose adjustments.  Patient was observed for about 4 hours in the ED did not have any recurrent seizures and was at her baseline mental status.  As far  as her wheezing she received prednisone and a DuoNeb after which she felt improved and wheezing was improved.  Unclear what caused increasing seizure frequency could be in the setting of illness causing the acute bronchitis.  Will prescribe 5 days of prednisone.  Discussed return precautions.  She is appropriate for discharge.       FINAL CLINICAL IMPRESSION(S) / ED DIAGNOSES   Final diagnoses:  Seizure ([place])  Acute bronchitis, unspecified organism  Lung nodule     Rx / DC Orders   ED Discharge Orders          Ordered    predniSONE (DELTASONE) 50 MG tablet        12/14/22 2107             Note:  This document was prepared using Dragon voice recognition software and may include unintentional dictation errors.   Rada Hay, MD 12/14/22 2110

## 2022-12-14 NOTE — ED Triage Notes (Addendum)
Pt sts that she has had several seizures today. Pt sts that she takes albuterol for her seizures. Pt sts that she has been using the albuterol for her seizures more frequently today. Pt endorses SOB and not being able to get the "junk" out of my lungs.

## 2023-03-09 ENCOUNTER — Emergency Department: Payer: 59

## 2023-03-09 ENCOUNTER — Emergency Department
Admission: EM | Admit: 2023-03-09 | Discharge: 2023-03-09 | Disposition: A | Discharge status: Home / Self Care | Payer: 59 | Attending: Emergency Medicine | Admitting: Emergency Medicine

## 2023-03-09 ENCOUNTER — Other Ambulatory Visit: Payer: Self-pay

## 2023-03-09 DIAGNOSIS — Z1152 Encounter for screening for COVID-19: Secondary | ICD-10-CM | POA: Diagnosis not present

## 2023-03-09 DIAGNOSIS — R5381 Other malaise: Secondary | ICD-10-CM | POA: Diagnosis not present

## 2023-03-09 DIAGNOSIS — R5383 Other fatigue: Secondary | ICD-10-CM | POA: Diagnosis not present

## 2023-03-09 DIAGNOSIS — R4182 Altered mental status, unspecified: Secondary | ICD-10-CM | POA: Insufficient documentation

## 2023-03-09 DIAGNOSIS — I2510 Atherosclerotic heart disease of native coronary artery without angina pectoris: Secondary | ICD-10-CM | POA: Diagnosis not present

## 2023-03-09 LAB — URINALYSIS, W/ REFLEX TO CULTURE (INFECTION SUSPECTED)
Bilirubin Urine: NEGATIVE
Glucose, UA: NEGATIVE mg/dL
Hgb urine dipstick: NEGATIVE
Ketones, ur: NEGATIVE mg/dL
Leukocytes,Ua: NEGATIVE
Nitrite: NEGATIVE
Protein, ur: NEGATIVE mg/dL
Specific Gravity, Urine: 1.006 (ref 1.005–1.030)
pH: 7 (ref 5.0–8.0)

## 2023-03-09 LAB — LACTIC ACID, PLASMA
Lactic Acid, Venous: 2 mmol/L (ref 0.5–1.9)
Lactic Acid, Venous: 1.5 mmol/L (ref 0.5–1.9)

## 2023-03-09 LAB — COMPREHENSIVE METABOLIC PANEL
ALT: 21 U/L (ref 0–44)
AST: 23 U/L (ref 15–41)
Albumin: 4 g/dL (ref 3.5–5.0)
Alkaline Phosphatase: 82 U/L (ref 38–126)
Anion gap: 10 (ref 5–15)
BUN: 23 mg/dL — ABNORMAL HIGH (ref 6–20)
CO2: 24 mmol/L (ref 22–32)
Calcium: 9.3 mg/dL (ref 8.9–10.3)
Chloride: 104 mmol/L (ref 98–111)
Creatinine, Ser: 0.96 mg/dL (ref 0.44–1.00)
GFR, Estimated: >60 mL/min (ref >60)
Glucose, Bld: 123 mg/dL — ABNORMAL HIGH (ref 70–99)
Potassium: 3.8 mmol/L (ref 3.5–5.1)
Sodium: 138 mmol/L (ref 135–145)
Total Bilirubin: 0.6 mg/dL (ref 0.3–1.2)
Total Protein: 7.7 g/dL (ref 6.5–8.1)

## 2023-03-09 LAB — SARS CORONAVIRUS 2 BY RT PCR: SARS Coronavirus 2 by RT PCR: NEGATIVE

## 2023-03-09 LAB — CBC WITH DIFFERENTIAL/PLATELET
Abs Immature Granulocytes: 0.04 10*3/uL (ref 0.00–0.07)
Basophils Absolute: 0.1 10*3/uL (ref 0.0–0.1)
Basophils Relative: 1 %
Eosinophils Absolute: 0.1 10*3/uL (ref 0.0–0.5)
Eosinophils Relative: 1 %
HCT: 38.5 % (ref 36.0–46.0)
Hemoglobin: 12.2 g/dL (ref 12.0–15.0)
Immature Granulocytes: 0 %
Lymphocytes Relative: 34 %
Lymphs Abs: 4.4 10*3/uL — ABNORMAL HIGH (ref 0.7–4.0)
MCH: 27 pg (ref 26.0–34.0)
MCHC: 31.7 g/dL (ref 30.0–36.0)
MCV: 85.2 fL (ref 80.0–100.0)
Monocytes Absolute: 0.8 10*3/uL (ref 0.1–1.0)
Monocytes Relative: 6 %
Neutro Abs: 7.7 10*3/uL (ref 1.7–7.7)
Neutrophils Relative %: 58 %
Platelets: 266 10*3/uL (ref 150–400)
RBC: 4.52 MIL/uL (ref 3.87–5.11)
RDW: 16.8 % — ABNORMAL HIGH (ref 11.5–15.5)
WBC: 13.1 10*3/uL — ABNORMAL HIGH (ref 4.0–10.5)
nRBC: 0 % (ref 0.0–0.2)

## 2023-03-09 LAB — TROPONIN I (HIGH SENSITIVITY)
Troponin I (High Sensitivity): 5 ng/L (ref <18)
Troponin I (High Sensitivity): 6 ng/L (ref <18)

## 2023-03-09 LAB — PROCALCITONIN: Procalcitonin: <0.1 ng/mL

## 2023-03-09 LAB — ETHANOL: Alcohol, Ethyl (B): <10 mg/dL (ref <10)

## 2023-03-09 LAB — VALPROIC ACID LEVEL: Valproic Acid Lvl: 74 ug/mL (ref 50.0–100.0)

## 2023-03-09 MED ORDER — ALBUTEROL SULFATE (2.5 MG/3ML) 0.083% IN NEBU: 3.0000 mL | INHALATION_SOLUTION | Freq: Four times a day (QID) | RESPIRATORY_TRACT | Status: DC | PRN

## 2023-03-09 MED ORDER — DIVALPROEX SODIUM 500 MG PO DR TAB: 500.0000 mg | DELAYED_RELEASE_TABLET | Freq: Two times a day (BID) | ORAL | Status: DC

## 2023-03-09 MED ORDER — LAMOTRIGINE 100 MG PO TABS: 200.0000 mg | ORAL_TABLET | Freq: Two times a day (BID) | ORAL | Status: DC

## 2023-03-09 MED ORDER — LOSARTAN POTASSIUM 50 MG PO TABS: 100.0000 mg | ORAL_TABLET | Freq: Every day | ORAL | Status: DC

## 2023-03-09 MED ORDER — BUDESON-GLYCOPYRROL-FORMOTEROL 160-9-4.8 MCG/ACT IN AERO: 2.0000 | INHALATION_SPRAY | Freq: Two times a day (BID) | RESPIRATORY_TRACT | Status: DC

## 2023-03-09 MED ORDER — NORTRIPTYLINE HCL 25 MG PO CAPS: 25.0000 mg | ORAL_CAPSULE | Freq: Every day | ORAL | Status: DC

## 2023-03-09 MED ORDER — SODIUM CHLORIDE 0.9 % IV BOLUS
1000.0000 mL | Freq: Once | INTRAVENOUS | Status: AC
  Administered 2023-03-09: 1000 mL via INTRAVENOUS

## 2023-03-09 MED ORDER — MOMETASONE FURO-FORMOTEROL FUM 100-5 MCG/ACT IN AERO: 2.0000 | INHALATION_SPRAY | Freq: Two times a day (BID) | RESPIRATORY_TRACT | Status: DC

## 2023-03-09 MED ORDER — FLUTICASONE PROPIONATE 50 MCG/ACT NA SUSP: 1.0000 | Freq: Two times a day (BID) | NASAL | Status: DC | PRN

## 2023-03-09 MED ORDER — QUETIAPINE FUMARATE 25 MG PO TABS: 25.0000 mg | ORAL_TABLET | Freq: Every day | ORAL | Status: DC

## 2023-03-09 MED ORDER — ATORVASTATIN CALCIUM 20 MG PO TABS: 40.0000 mg | ORAL_TABLET | Freq: Every day | ORAL | Status: DC

## 2023-03-09 MED ORDER — MONTELUKAST SODIUM 10 MG PO TABS: 10.0000 mg | ORAL_TABLET | Freq: Every day | ORAL | Status: DC

## 2023-03-09 MED ORDER — UMECLIDINIUM BROMIDE 62.5 MCG/ACT IN AEPB: 1.0000 | INHALATION_SPRAY | Freq: Every day | RESPIRATORY_TRACT | Status: DC

## 2023-03-09 MED ORDER — LACOSAMIDE 50 MG PO TABS: 200.0000 mg | ORAL_TABLET | Freq: Two times a day (BID) | ORAL | Status: DC

## 2023-03-09 MED ORDER — PANTOPRAZOLE SODIUM 40 MG PO TBEC: 40.0000 mg | DELAYED_RELEASE_TABLET | Freq: Every day | ORAL | Status: DC

## 2023-03-09 MED ORDER — AMLODIPINE BESYLATE 5 MG PO TABS: 5.0000 mg | ORAL_TABLET | Freq: Every day | ORAL | Status: DC

## 2023-03-09 NOTE — ED Notes (Signed)
Pt provided with ice water and sandwich tray. No further needs at this time. WCTM.

## 2023-03-09 NOTE — ED Provider Notes (Signed)
[facility] Provider Note    Event Date/Time   First MD Initiated Contact with Patient 03/09/23 0141     (approximate)   History   Fatigue   HPI  Level V caveat: Limited by decreased LOC  Kelly [name] is a 57 y.o. female brought to the ED via EMS from home with a chief complaint of generalized malaise and altered mentation.  Patient with a history of schizophrenia, seizures, CAD who did not take her medications tonight.  She was confused when EMS informed her it was 1 AM.  Complains of generalized malaise all day.  Low-grade temperature per EMS.  Patient denies specific pain in her chest, abdomen.  Denies breathing difficulty, nausea/vomiting/diarrhea.     Past Medical History   Past Medical History:  Diagnosis Date   Acid reflux    Anxiety    Hypercholesteremia    Myocardial infarction    Schizophrenia    Seizures      Active Problem List   Patient Active Problem List   Diagnosis Date Noted   Left-sided weakness 07/11/2018   Schizoaffective disorder 04/08/2016   Acute anxiety 04/08/2016     Past Surgical History   Past Surgical History:  Procedure Laterality Date   CARDIAC CATHETERIZATION     CESAREAN SECTION     DILATION AND CURETTAGE OF UTERUS     FOOT SURGERY     TONSILLECTOMY       Home Medications   Prior to Admission medications   Medication Sig Start Date End Date Taking? Authorizing Provider  atorvastatin (LIPITOR) 40 MG tablet Take 40 mg by mouth at bedtime. 10/22/19   [provider]  BREZTRI AEROSPHERE 160-9-4.8 MCG/ACT AERO Inhale 2 puffs into the lungs 2 (two) times daily. 10/19/19   [provider]  Cholecalciferol (VITAMIN D3) 50000 units CAPS TAKE ONE CAPSULE BY MOUTH ONE TIME PER WEEK 01/05/18   [provider]  cloNIDine (CATAPRES) 0.1 MG tablet Take by mouth. 10/11/18   [provider]  lamoTRIgine (LAMICTAL) 200 MG tablet Take 1 tablet (200 mg total) by mouth 2 (two) times  daily. 07/13/18   Enid Baas, MD  losartan (COZAAR) 100 MG tablet Take 100 mg by mouth daily. 09/25/19   [provider]  montelukast (SINGULAIR) 10 MG tablet Take 10 mg by mouth at bedtime.  05/04/18   [provider]  nortriptyline (PAMELOR) 10 MG capsule Start Nortriptyline (Pamelor) 10 mg nightly for one week, then increase to 20 mg nightly 11/27/19   [provider]  omeprazole (PRILOSEC) 40 MG capsule Take 40 mg by mouth daily. 11/10/19   [provider]  predniSONE (DELTASONE) 50 MG tablet Take 1 pill daily for 5 days 12/14/22   Georga Hacking, MD  predniSONE (STERAPRED UNI-PAK 21 TAB) 10 MG (21) TBPK tablet Per packaging instructions 04/29/21   Phineas Semen, MD  VENTOLIN HFA 108 [phone] Base) MCG/ACT inhaler Inhale 2 puffs into the lungs every 6 (six) hours as needed. 10/05/19   [provider]  VIMPAT 200 MG TABS tablet Take 200 mg by mouth 2 (two) times daily.  02/12/16   [provider]     Allergies  Percocet [oxycodone-acetaminophen]   Family History   Family History  Problem Relation Age of Onset   Breast cancer Mother    Diabetes Maternal Aunt    Ovarian cancer Neg Hx    Colon cancer Neg Hx      Physical Exam  Triage Vital  Signs: ED Triage Vitals [03/09/23 0141]  Enc Vitals Group     BP      Pulse      Resp      Temp      Temp src      SpO2 98 %     Weight      Height      Head Circumference      Peak Flow      Pain Score      Pain Loc      Pain Edu?      Excl. in GC?     Updated Vital Signs: BP (!) 153/89   Pulse (!) 103   Temp 98.2 F (36.8 C) (Rectal)   Resp 14   Ht  (1.676 m)   Wt 90.7 kg   LMP  (LMP Unknown)   SpO2 96%   BMI 32.28 kg/m    General: Awake, mild distress.  CV:  Tachycardic.  Good peripheral perfusion.  Resp:  Normal effort.  CTAB. Abd:  Nontender.  No distention.  Other:  Flat affect.  Baseline essential tremors.  Confused.  Alert and oriented to person  and place.  CN II-XII intact.  5/5 motor strength and sensation all extremities.  M AE x 4.  Supple neck without meningismus.  No carotid bruits.  No petechiae.   ED Results / Procedures / Treatments  Labs (all labs ordered are listed, but only abnormal results are displayed) Labs Reviewed  LACTIC ACID, PLASMA - Abnormal; Notable for the following components:      Result Value   Lactic Acid, Venous 2.0 (*)    All other components within normal limits  CBC WITH DIFFERENTIAL/PLATELET - Abnormal; Notable for the following components:   WBC 13.1 (*)    RDW 16.8 (*)    Lymphs Abs 4.4 (*)    All other components within normal limits  COMPREHENSIVE METABOLIC PANEL - Abnormal; Notable for the following components:   Glucose, Bld 123 (*)    BUN 23 (*)    All other components within normal limits  URINALYSIS, W/ REFLEX TO CULTURE (INFECTION SUSPECTED) - Abnormal; Notable for the following components:   Color, Urine STRAW (*)    APPearance CLEAR (*)    Bacteria, UA RARE (*)    All other components within normal limits  SARS CORONAVIRUS 2 BY RT PCR  CULTURE, BLOOD (ROUTINE X 2)  CULTURE, BLOOD (ROUTINE X 2)  LACTIC ACID, PLASMA  ETHANOL  VALPROIC ACID LEVEL  PROCALCITONIN  TROPONIN I (HIGH SENSITIVITY)  TROPONIN I (HIGH SENSITIVITY)     EKG  ED ECG REPORT I, Lyan Moyano J, the attending physician, personally viewed and interpreted this ECG.   Date: 03/09/2023  EKG Time: 0141  Rate: 104  Rhythm: sinus tachycardia  Axis: Normal  Intervals:none  ST&T Change: Nonspecific    RADIOLOGY I have independently visualized and interpreted patient's chest x-ray and CT scan as well as noted the radiology interpretation:  Chest x-ray: No acute cardiopulmonary process  CT head: No ICH  Official radiology report(s): CT Head Wo Contrast  Result Date: 03/09/2023 CLINICAL DATA:  Mental status change, unknown cause EXAM: CT HEAD WITHOUT CONTRAST TECHNIQUE: Contiguous axial images were  obtained from the base of the skull through the vertex without intravenous contrast. RADIATION DOSE REDUCTION: This exam was performed according to the departmental dose-optimization program which includes automated exposure control, adjustment of the mA and/or kV according to patient size and/or use of iterative  reconstruction technique. COMPARISON:  12/14/2022 FINDINGS: Brain: No evidence of acute infarction, hemorrhage, hydrocephalus, extra-axial collection or mass lesion/mass effect. Vascular: No hyperdense vessel or unexpected calcification. Skull: Normal. Negative for fracture or focal lesion. Sinuses/Orbits: No acute finding. Other: Mastoid air cells and middle ear cavities are clear. IMPRESSION: 1. No acute intracranial abnormality. Electronically Signed   By: Helyn Numbers M.D.   On: 03/09/2023 02:25   DG Chest Port 1 View  Result Date: 03/09/2023 CLINICAL DATA:  Altered mental status EXAM: PORTABLE CHEST 1 VIEW COMPARISON:  03/09/2023 FINDINGS: The heart size and mediastinal contours are within normal limits. Both lungs are clear. The visualized skeletal structures are unremarkable. IMPRESSION: No active disease. Electronically Signed   By: Helyn Numbers M.D.   On: 03/09/2023 02:05     PROCEDURES:  Critical Care performed: No  .1-3 Lead EKG Interpretation  Performed by: Irean Hong, MD Authorized by: Irean Hong, MD     Interpretation: abnormal     ECG rate:  105   ECG rate assessment: tachycardic     Rhythm: sinus tachycardia     Ectopy: none     Conduction: normal   Comments:     Placed on cardiac monitor to evaluate for arrhythmias    MEDICATIONS ORDERED IN ED: Medications  sodium chloride 0.9 % bolus 1,000 mL (0 mLs Intravenous Stopped 03/09/23 0406)     IMPRESSION / MDM / ASSESSMENT AND PLAN / ED COURSE  I reviewed the triage vital signs and the nursing notes.                             57 year old female presenting with generalized weakness, malaise and altered  mental status. Differential diagnosis includes, but is not limited to, alcohol, illicit or prescription medications, or other toxic ingestion; intracranial pathology such as stroke or intracerebral hemorrhage; fever or infectious causes including sepsis; hypoxemia and/or hypercarbia; uremia; trauma; endocrine related disorders such as diabetes, hypoglycemia, and thyroid-related diseases; hypertensive encephalopathy; etc. I personally reviewed patient's records and note that she had a neurology office visit for seizures on 10/14/2022.  Patient's presentation is most consistent with acute presentation with potential threat to life or bodily function.  The patient is on the cardiac monitor to evaluate for evidence of arrhythmia and/or significant heart rate changes.  Will obtain sepsis workup, CT head, chest x-ray.  Initiate IV fluid hydration, will reassess.  Clinical Course as of 03/09/23 0513  Tue Mar 09, 2023  0228 Lactic acid is 2.0; will add procalcitonin. [JS]  [phone] Patient now tells the nurse that she had a seizure at home tonight and that "my machine knows", referring to her CPAP machine. [JS]  0435 Procalcitonin and urine unremarkable.  Awaiting repeat troponin.  Patient resting in no acute distress. [JS]  0510 Updated patient on all test results.  States she is still feeling fatigued.  I discussed the case with hospitalist who requested ambulation trial which patient ambulated to the commode without difficulty.  Given negative workup, patient does not meet criteria for hospitalization.  I discussed this with her and offered social work consult for home health needs which she would like. [JS]    Clinical Course User Index [JS] Irean Hong, MD     FINAL CLINICAL IMPRESSION(S) / ED DIAGNOSES   Final diagnoses:  Malaise  Altered mental status, unspecified altered mental status type     Rx / DC Orders   ED  Discharge Orders     None        Note:  This document was prepared  using Dragon voice recognition software and may include unintentional dictation errors.   Irean Hong, MD 03/09/23 [phone]

## 2023-03-09 NOTE — TOC Transition Note (Signed)
Transition of Care [facility]) - CM/SW Discharge Note   Patient Details  Name: Kelly [name] MRN: [id number] Date of Birth: 08-23-1966  Transition of Care [facility]) CM/SW Contact:  Darolyn Rua, LCSW Phone Number: 03/09/2023, 2:24 PM   Clinical Narrative:     CSW met with patient at bedside, agreeable to [facility] PT. Patient is set up with [facility] PT, reports no dme needs. Reports she will need taxi home to [address] [place], no further discharge needs identified.   Final next level of care: Home w Home Health Services Barriers to Discharge: No Barriers Identified   Patient Goals and CMS Choice      Discharge Placement                      Patient and family notified of of transfer: 03/09/23  Discharge Plan and Services Additional resources added to the After Visit Summary for                                       Social Determinants of Health (SDOH) Interventions SDOH Screenings   Tobacco Use: Medium Risk (03/09/2023)     Readmission Risk Interventions     No data to display

## 2023-03-09 NOTE — ED Provider Notes (Signed)
Patient seen by [facility], home health arranged, appropriate for discharge   Jene Every, MD 03/09/23 1429

## 2023-03-09 NOTE — ED Notes (Signed)
Cab called for pt, pt taken to ED exit via [facility] by this RN.  Pt AO4/GCS15, not ill appearing, NAD, VSS on discharge. Pt cleared by social work and ED provider to go home.

## 2023-03-09 NOTE — ED Notes (Signed)
Pt ambulated with steady gait to bathroom, pt denied any dizziness or balance problems. ERP notified.

## 2023-03-09 NOTE — ED Triage Notes (Signed)
Pt arrives via [facility] from home with CC of generalized fatigue for the last few days. Non-compliant with medications and seemingly altered with EMS - unsure if medications have been taken today (clonidine, etc.). Pt does wear CPAP at night. Pt is oriented x4 at this time but does seem lethargic.

## 2023-03-14 LAB — CULTURE, BLOOD (ROUTINE X 2)
Culture: NO GROWTH
Culture: NO GROWTH
Special Requests: ADEQUATE

## 2023-04-29 DIAGNOSIS — I10 Essential (primary) hypertension: Secondary | ICD-10-CM | POA: Diagnosis not present

## 2023-04-29 DIAGNOSIS — Z1211 Encounter for screening for malignant neoplasm of colon: Secondary | ICD-10-CM | POA: Diagnosis not present

## 2023-04-29 DIAGNOSIS — Z1231 Encounter for screening mammogram for malignant neoplasm of breast: Secondary | ICD-10-CM | POA: Diagnosis not present

## 2023-05-05 DIAGNOSIS — J449 Chronic obstructive pulmonary disease, unspecified: Secondary | ICD-10-CM | POA: Diagnosis not present

## 2023-05-10 DIAGNOSIS — H04123 Dry eye syndrome of bilateral lacrimal glands: Secondary | ICD-10-CM | POA: Diagnosis not present

## 2023-05-10 DIAGNOSIS — H40013 Open angle with borderline findings, low risk, bilateral: Secondary | ICD-10-CM | POA: Diagnosis not present

## 2023-05-10 DIAGNOSIS — H35033 Hypertensive retinopathy, bilateral: Secondary | ICD-10-CM | POA: Diagnosis not present

## 2023-05-13 IMAGING — CT CT CERVICAL SPINE W/O CM
3 of 8 series · 10 of 33 positions shown, 11 images · non-contrast
Comparison: Brain and cervical CT 07/11/2018

CLINICAL DATA: Seizure



[Series 6: sagittal bone · sagittal · 0.18mm/px · 5 of 62 slices shown]
[im 11/62  bone]
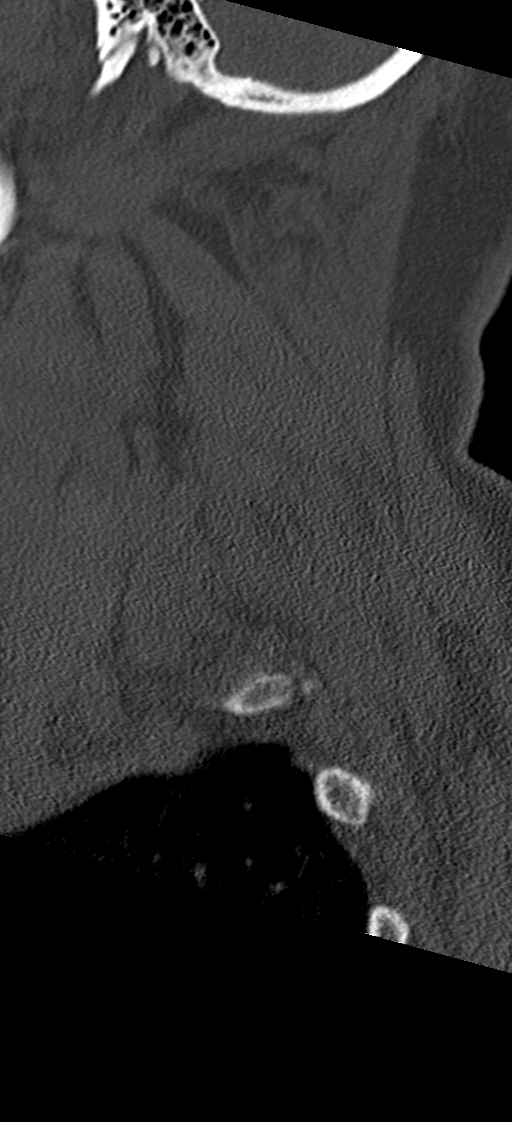
[im 21/62  bone]
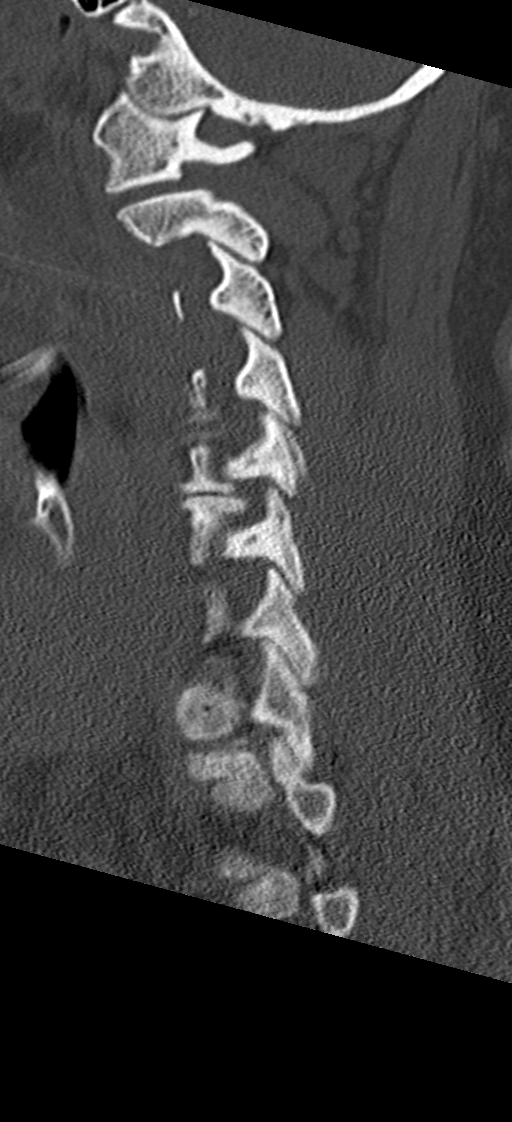
[im 31/62  bone]
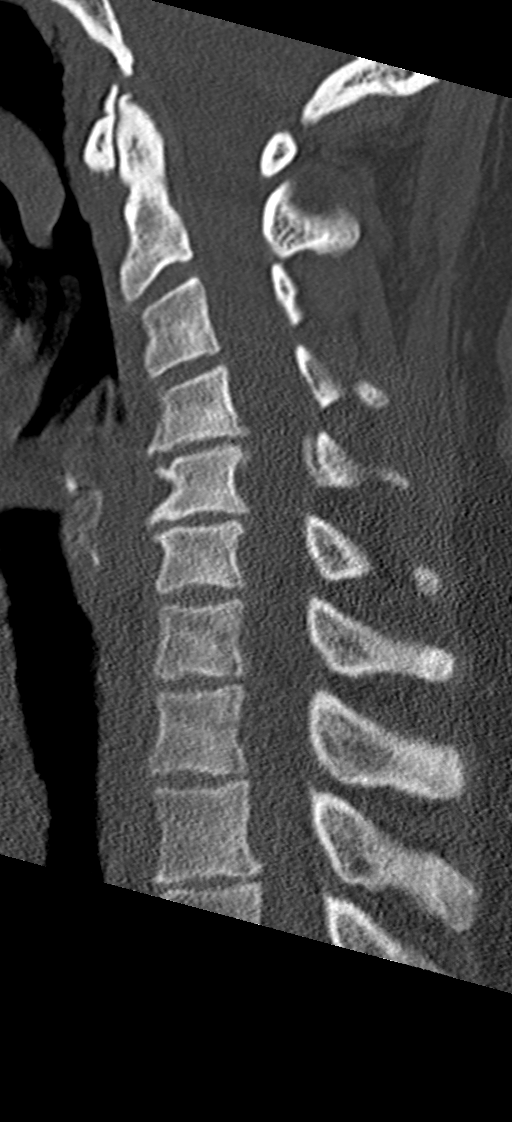
[im 41/62  bone]
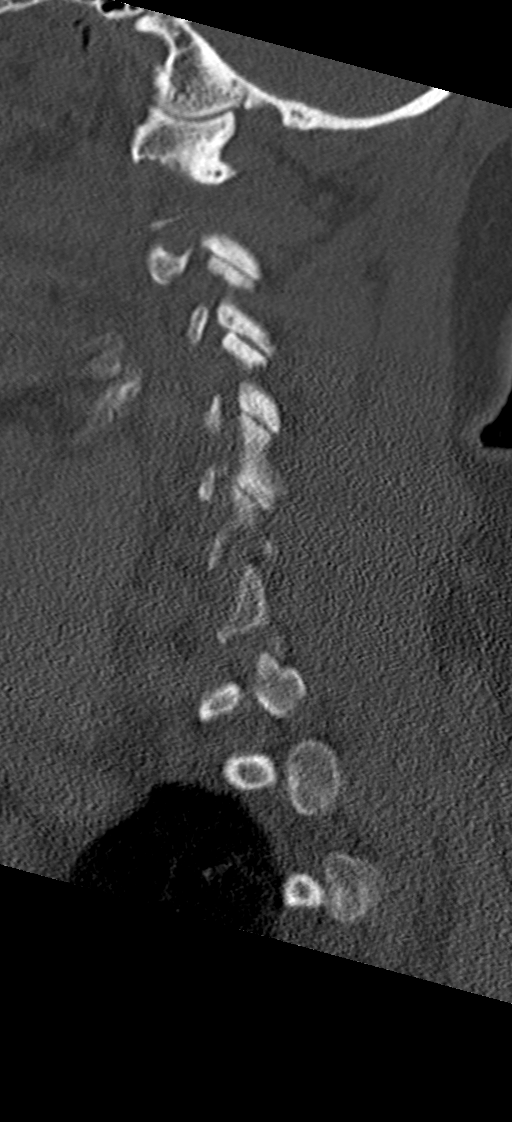
[im 51/62  bone]
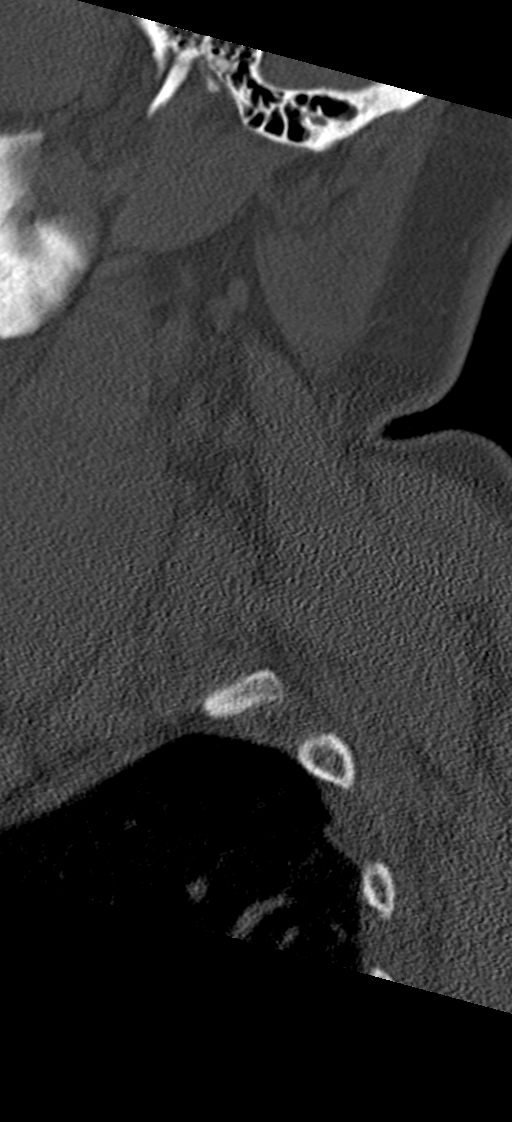

[Series 7: coronal bone · coronal · 0.24mm/px · 2 of 48 slices shown]
[im 15/48  bone]
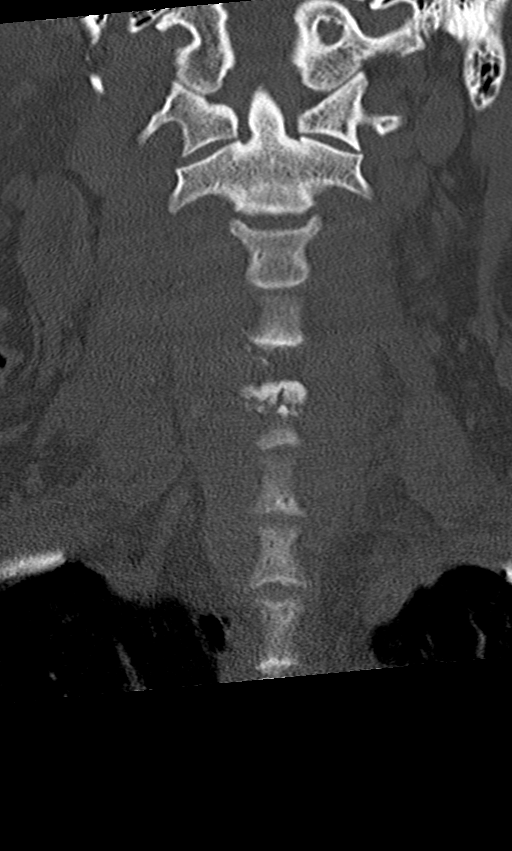
[im 30/48  bone]
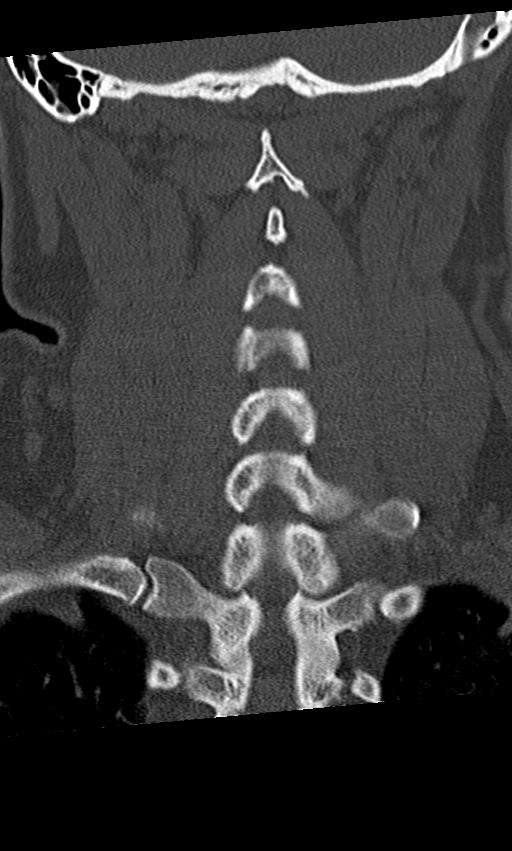

[Series 8: orthogonal bone · axial · 0.18mm/px · z∈[-286,-196]mm · 3 of 95 slices shown, 4 images]
[im 24/95  soft-tissue]
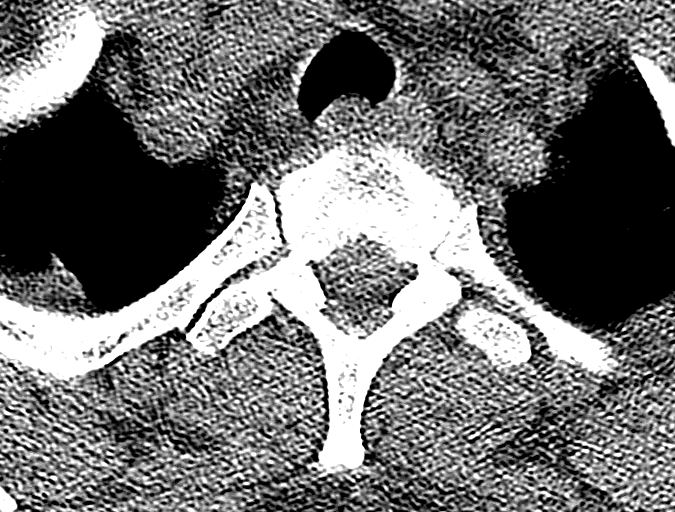
[im 24/95  bone]
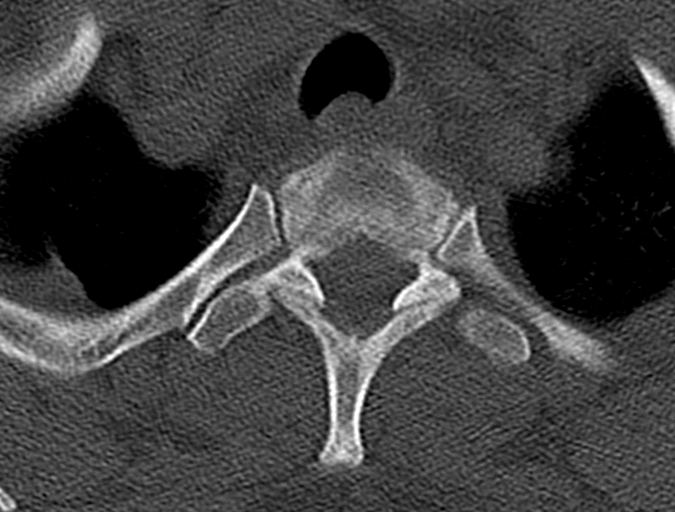
[im 48/95  bone]
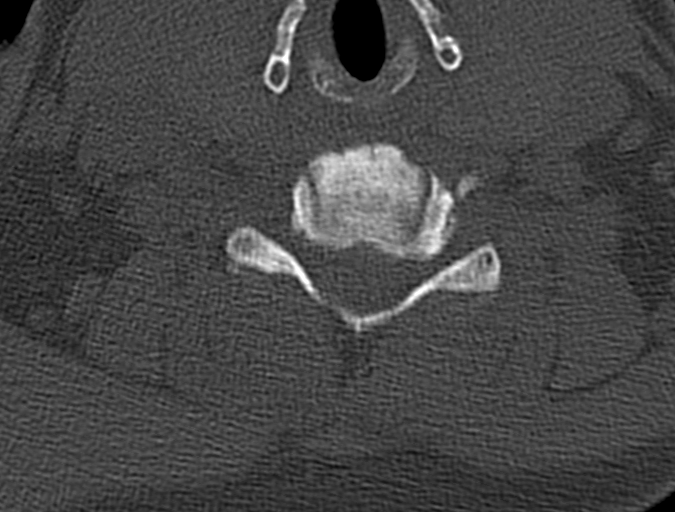
[im 71/95  bone]
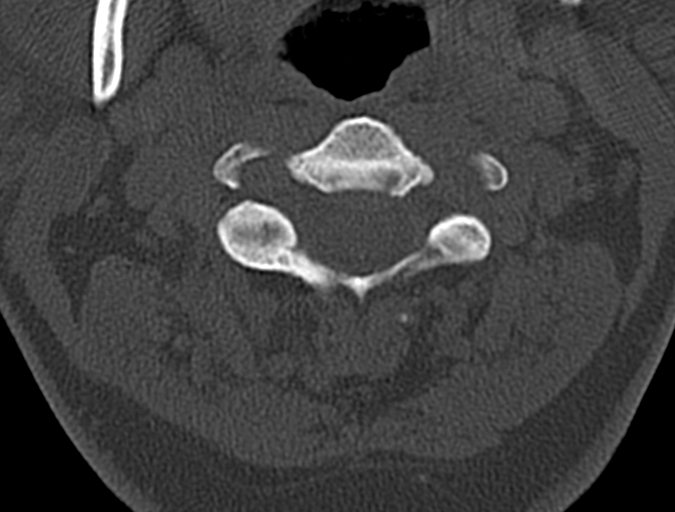

[10 of 33 positions shown; findings below may reference images not displayed]

FINDINGS: CT HEAD FINDINGS

Brain: No evidence of acute infarction, hemorrhage, hydrocephalus,
extra-axial collection or mass lesion/mass effect.

Vascular: No hyperdense vessels. Mild carotid vascular calcification

Skull: Normal. Negative for fracture or focal lesion.

Sinuses/Orbits: No acute finding.

Other: None

CT CERVICAL SPINE FINDINGS

Alignment: Mild reversal of cervical lordosis. No subluxation. Facet
alignment within normal limits

Skull base and vertebrae: No acute fracture. No primary bone lesion
or focal pathologic process.

Soft tissues and spinal canal: No prevertebral fluid or swelling. No
visible canal hematoma.

Disc levels: Moderate degenerative changes C4-C5 and C5-C6. Mild
bilateral foraminal narrowing at these levels.

Upper chest: Negative.

Other: None
IMPRESSION: 1. No CT evidence for acute intracranial abnormality.
2. Mild reversal of cervical lordosis with degenerative changes. No
acute osseous abnormality is seen.

## 2023-05-27 DIAGNOSIS — R2689 Other abnormalities of gait and mobility: Secondary | ICD-10-CM | POA: Diagnosis not present

## 2023-05-27 DIAGNOSIS — M79601 Pain in right arm: Secondary | ICD-10-CM | POA: Diagnosis not present

## 2023-05-27 DIAGNOSIS — R42 Dizziness and giddiness: Secondary | ICD-10-CM | POA: Diagnosis not present

## 2023-05-27 DIAGNOSIS — R569 Unspecified convulsions: Secondary | ICD-10-CM | POA: Diagnosis not present

## 2023-05-27 DIAGNOSIS — G250 Essential tremor: Secondary | ICD-10-CM | POA: Diagnosis not present

## 2023-05-27 DIAGNOSIS — M542 Cervicalgia: Secondary | ICD-10-CM | POA: Diagnosis not present

## 2023-05-27 DIAGNOSIS — G479 Sleep disorder, unspecified: Secondary | ICD-10-CM | POA: Diagnosis not present

## 2023-05-27 DIAGNOSIS — M79602 Pain in left arm: Secondary | ICD-10-CM | POA: Diagnosis not present

## 2023-05-27 DIAGNOSIS — R519 Headache, unspecified: Secondary | ICD-10-CM | POA: Diagnosis not present

## 2023-06-07 DIAGNOSIS — L723 Sebaceous cyst: Secondary | ICD-10-CM | POA: Diagnosis not present

## 2023-06-17 DIAGNOSIS — J449 Chronic obstructive pulmonary disease, unspecified: Secondary | ICD-10-CM | POA: Diagnosis not present

## 2023-07-21 DIAGNOSIS — G250 Essential tremor: Secondary | ICD-10-CM | POA: Diagnosis not present

## 2023-07-21 DIAGNOSIS — G479 Sleep disorder, unspecified: Secondary | ICD-10-CM | POA: Diagnosis not present

## 2023-07-21 DIAGNOSIS — M542 Cervicalgia: Secondary | ICD-10-CM | POA: Diagnosis not present

## 2023-07-21 DIAGNOSIS — R42 Dizziness and giddiness: Secondary | ICD-10-CM | POA: Diagnosis not present

## 2023-07-21 DIAGNOSIS — R2689 Other abnormalities of gait and mobility: Secondary | ICD-10-CM | POA: Diagnosis not present

## 2023-07-21 DIAGNOSIS — R519 Headache, unspecified: Secondary | ICD-10-CM | POA: Diagnosis not present

## 2023-07-21 DIAGNOSIS — M79601 Pain in right arm: Secondary | ICD-10-CM | POA: Diagnosis not present

## 2023-07-21 DIAGNOSIS — Z87898 Personal history of other specified conditions: Secondary | ICD-10-CM | POA: Diagnosis not present

## 2023-07-27 ENCOUNTER — Ambulatory Visit: Payer: 59 | Attending: Neurology | Admitting: Physical Therapy

## 2023-07-27 ENCOUNTER — Encounter: Payer: Self-pay | Admitting: Physical Therapy

## 2023-07-27 DIAGNOSIS — R2689 Other abnormalities of gait and mobility: Secondary | ICD-10-CM | POA: Insufficient documentation

## 2023-07-27 DIAGNOSIS — R262 Difficulty in walking, not elsewhere classified: Secondary | ICD-10-CM | POA: Diagnosis not present

## 2023-07-27 DIAGNOSIS — M6281 Muscle weakness (generalized): Secondary | ICD-10-CM | POA: Diagnosis not present

## 2023-07-27 NOTE — Therapy (Unsigned)
OUTPATIENT PHYSICAL THERAPY BALANCE EVALUATION   Patient Name: Kelly [name] MRN: [id number] DOB:10-30-1966, 57 y.o., female Today's Date: 07/27/2023   PT End of Session - 07/27/23 1129     Visit Number 1    Number of Visits 17    Date for PT Re-Evaluation 09/23/23    Authorization Type UHC Medicare/Medicaid [id number]    PT Start Time 1040    PT Stop Time 1125    PT Time Calculation (min) 45 min    Activity Tolerance Patient limited by pain    Behavior During Therapy Anxious             Past Medical History:  Diagnosis Date   Acid reflux    Anxiety    Hypercholesteremia    Myocardial infarction (HCC)    Schizophrenia (HCC)    Seizures (HCC)    Past Surgical History:  Procedure Laterality Date   CARDIAC CATHETERIZATION     CESAREAN SECTION     DILATION AND CURETTAGE OF UTERUS     FOOT SURGERY     TONSILLECTOMY     Patient Active Problem List   Diagnosis Date Noted   Left-sided weakness 07/11/2018   Schizoaffective disorder (HCC) 04/08/2016   Acute anxiety 04/08/2016    PCP: Morene Crocker, MD  REFERRING PROVIDER: [facility]  REFERRING DIAGNOSIS:  R26.89 (ICD-10-CM) - Imbalance     THERAPY DIAG: No diagnosis found.  ONSET DATE: 05/26/23 (worsening imbalance in last 2 months)  FOLLOW UP APPT WITH PROVIDER: Yes ; 08/26/23   RATIONALE FOR EVALUATION AND TREATMENT: Rehabilitation  SUBJECTIVE:                                                                                                                                                                                         Chief Complaint: Pt is a 57 year old female referred for imbalance with Hx of seizure disorder, schizophrenia, anxiety, MI.   Pertinent History  Pt is a 57 year old female referred for imbalance with Hx of seizure disorder, schizophrenia, anxiety, MI. Pt has worsening imbalance; difficulty with going up stairs. Difficulty picking up feet when walking. Pt reports episodes of her  legs giving out. Pt reports L lower extremity and L upper extremity. Her neurologist found that she was dragging LLE. Patient reports episode of lower extremities buckling when standing on her porch. Patient reports some sensory change/numbness affecting her L side; she reports paresthesias affecting L>R foot. Patient reports no Hx of TIA/CVA. Pt does have notable seizure disorder.    Pain: Yes; L knee, L shoulder Numbness/Tingling: Yes; L>R foot; L-sided sensory loss in arm/leg  Focal Weakness: Yes; weakness L>RLE  in thighs/legs Recent changes in overall health/medication: No recent changes in medication  Prior history of physical therapy for balance:  Yes; previous episode of PT for balance  Falls: Has patient fallen in last 6 months? Yes, Number of falls: 5; some back pain after having fall  Directional pattern for falls: Yes; falling forward with BLE buckling  Imaging: Yes ;  03/09/23 Head CT  1. No acute intracranial abnormality.    Prior level of function: Independent with community mobility without device  Occupational demands: Disabled  Hobbies: None stated  Red flags (bowel/bladder changes, saddle paresthesia, personal history of cancer, h/o spinal tumors, h/o compression fx, h/o abdominal aneurysm, abdominal pain, chills/fever, night sweats, nausea, vomiting, unrelenting pain): Negative  Precautions: None  Weight Bearing Restrictions: No  Living Environment Lives with: lives alone; pt has medical pull cords in house to get emergency care prn.  Lives in: Apartment/assisted living complex Has following equipment at home: None   Patient Goals: Improve dragging LLE and weakness/stiffness of her upper limb     OBJECTIVE:   Patient Surveys  FOTO: 44, predicted improvement to 16 ABC: To be obtained next visit   Cognition Patient is oriented to person, place, and time.  Recent memory is intact.  Remote memory is intact.  Attention span and concentration are intact.   Expressive speech is intact.  Patient's fund of knowledge is within normal limits for educational level.     Gross Musculoskeletal Assessment Tremor: None Bulk: Normal Tone: Normal   GAIT: Distance walked: *** Assistive device utilized: {Assistive devices:23999} Level of assistance: {Levels of assistance:24026} Comments: ***   Posture: Self-selected     AROM  AROM (Normal range in degrees) AROM  07/27/2023  Lumbar   Flexion (65)   Extension (30)   Right lateral flexion (25)   Left lateral flexion (25)   Right rotation (30)   Left rotation (30)       Hip Right Left  Flexion (125)    Extension (15)    Abduction (40)    Adduction     Internal Rotation (45)    External Rotation (45)        Knee    Flexion (135)    Extension (0)        Ankle    Dorsiflexion (20)    Plantarflexion (50)    Inversion (35)    Eversion (15    (* = pain; Blank rows = not tested)   LE MMT:  MMT (out of 5) Right 07/27/2023 Left 07/27/2023  Hip flexion    Hip extension    Hip abduction    Hip adduction    Hip internal rotation    Hip external rotation    Knee flexion    Knee extension    Ankle dorsiflexion    Ankle plantarflexion    Ankle inversion    Ankle eversion    (* = pain; Blank rows = not tested)   Sensation Sensory loss along fibular head and medial malleolus bilat. Proprioception, and hot/cold testing deferred on this date.   Reflexes Deferred   Cranial Nerves Visual acuity and visual fields are intact  Convergence insuffiencey Extraocular muscles are intact  Facial sensation is intact bilaterally  Facial strength is intact bilaterally  Hearing is normal as tested by gross conversation Palate elevates midline, normal phonation  Shoulder shrug strength is intact  Tongue protrudes midline   Coordination/Cerebellar Finger to Nose: WNL Heel to Shin: WNL Rapid alternating movements: WNL Finger  Opposition: WNL Pronator Drift:  Negative   FUNCTIONAL OUTCOME MEASURES   Results Comments  BERG /56 Fall risk, in need of intervention  DGI /24   FGA /30   TUG 28.5 seconds   5TSTS seconds   6 Minute Walk Test    10 Meter Gait Speed Self-selected: s = m/s; Fastest: s = m/s Below normative values for full community ambulation  (Blank rows = not tested)    TODAY'S TREATMENT    Therapeutic Exercise - for HEP establishment, discussion on appropriate exercise/activity modification, PT education   Reviewed baseline home exercises and provided handout for [employer] program (see Access Code); tactile cueing and therapist demonstration utilized as needed for carryover of proper technique to HEP.    Patient education on current condition, role of PT, prognosis, plan of care. We recommended use of cane for decreased fall risk and to assist with gait stability and prevent LE buckling.      PATIENT EDUCATION:  Education details: see above for patient education details Person educated: Patient Education method: Explanation, Demonstration, and Handouts Education comprehension: verbalized understanding and returned demonstration   HOME EXERCISE PROGRAM: Access Code: [email or web address] URL: [email or web address] Date: 07/27/2023 Prepared by: Consuela Mimes  Exercises - Seated March  - 2 x daily - 7 x weekly - 2 sets - 10 reps - Seated Long Arc Quad  - 2 x daily - 7 x weekly - 2 sets - 10 reps   ASSESSMENT:  CLINICAL IMPRESSION: Patient is a 57 y.o. female who was seen today for physical therapy evaluation and treatment for imbalance and difficulty c gait with Hx of falls. Objective impairments include {opptimpairments:25111}. These impairments are limiting patient from {activity limitations:25113}. Personal factors including {Personal factors:25162} are also affecting patient's functional outcome. Patient will benefit from skilled PT to address above impairments and improve overall function.  REHAB  POTENTIAL: {rehabpotential:25112}  CLINICAL DECISION MAKING: {clinical decision making:25114}  EVALUATION COMPLEXITY: {Evaluation complexity:25115}   GOALS: Goals reviewed with patient? Yes  SHORT TERM GOALS: Target date: 08/17/2023  Pt will be independent with HEP in order to improve strength and balance in order to decrease fall risk and improve function at home. Baseline: 07/27/23: Baseline HEP initiated.  Goal status: INITIAL  Pt will perform independent sit to stand with no upper extremity support as needed for home-level and community-level mobility and indicative of improved functional LE strength  Baseline: 07/27/23: Difficulty with sit to stand and heavy reliance on pushing from armrests.  Goal status: INITIAL   LONG TERM GOALS: Target date: 09/21/2023  Pt will increase FOTO to at least 52 to demonstrate significant improvement in function at home related to balance  Baseline: 07/27/23: 44 Goal status: INITIAL  2.  Pt will improve BERG by at least 3 points in order to demonstrate clinically significant improvement in balance.   Baseline: 07/27/23: To be performed on visit # 2.  Goal status: INITIAL  3.  Pt will improve ABC by at least 13% in order to demonstrate clinically significant improvement in balance confidence.      Baseline: 07/27/23: To be completed on visit # 2.  Goal status: INITIAL  4. Pt will improve DGI by at least 3 points in order to demonstrate clinically significant improvement in balance and decreased risk for falls.     Baseline: 07/27/23: To be performed on visit # 2.  Goal status: INITIAL  5. Pt will decrease TUG to below 14 seconds/decrease in order to demonstrate decreased fall risk.  Baseline: 07/27/23:  28.5 sec Goal status: INITIAL    PLAN: PT FREQUENCY: 2x/week  PT DURATION: 12 weeks  PLANNED INTERVENTIONS: Therapeutic exercises, Therapeutic activity, Neuromuscular re-education, Balance training, Gait training, Patient/Family education,  Joint manipulation, Joint mobilization, Canalith repositioning, Aquatic Therapy, Dry Needling, Cognitive remediation, Electrical stimulation, Spinal manipulation, Spinal mobilization, Cryotherapy, Moist heat, Traction, Ultrasound, Ionotophoresis 4mg /ml Dexamethasone, and Manual therapy  PLAN FOR NEXT SESSION: Test BERG, DGI, Romberg, mCTSIB. Discuss home exercises pt can use for back and hip mobility. Update HEP for LE strength and balance with successive visits.    Consuela Mimes, PT, DPT #[id number]  Gertie Exon 07/27/2023, 11:30 AM

## 2023-07-29 ENCOUNTER — Ambulatory Visit: Payer: 59 | Admitting: Physical Therapy

## 2023-07-29 NOTE — Therapy (Deleted)
OUTPATIENT PHYSICAL THERAPY TREATMENT   Patient Name: Kelly [name] MRN: [id number] DOB:10/06/1966, 57 y.o., female Today's Date: 07/29/23      Past Medical History:  Diagnosis Date   Acid reflux    Anxiety    Hypercholesteremia    Myocardial infarction (HCC)    Schizophrenia (HCC)    Seizures (HCC)    Past Surgical History:  Procedure Laterality Date   CARDIAC CATHETERIZATION     CESAREAN SECTION     DILATION AND CURETTAGE OF UTERUS     FOOT SURGERY     TONSILLECTOMY     Patient Active Problem List   Diagnosis Date Noted   Left-sided weakness 07/11/2018   Schizoaffective disorder (HCC) 04/08/2016   Acute anxiety 04/08/2016    PCP: Morene Crocker, MD  REFERRING PROVIDER: [facility]  REFERRING DIAGNOSIS:  R26.89 (ICD-10-CM) - Imbalance     THERAPY DIAG: Imbalance  Difficulty in walking, not elsewhere classified  Muscle weakness (generalized)  ONSET DATE: 05/26/23 (worsening imbalance in last 2 months)  FOLLOW UP APPT WITH PROVIDER: Yes ; 08/26/23   RATIONALE FOR EVALUATION AND TREATMENT: Rehabilitation  SUBJECTIVE:                                                                                                                                                                                          Pertinent History  Pt is a 57 year old female referred for imbalance with Hx of seizure disorder, schizophrenia, anxiety, MI. Pt has worsening imbalance; difficulty with going up stairs. Difficulty picking up feet when walking. Pt reports episodes of her legs giving out. Pt reports L lower extremity and L upper extremity discomfort; pt has L-sided weakness. Patient reports no Hx of TIA/CVA. Patient reports episode of lower extremities buckling when standing on her porch. Patient reports some sensory change/numbness affecting her L side; she reports paresthesias affecting L>R foot.  Pt does have notable seizure disorder.    Pain: Yes; L knee, L  shoulder Numbness/Tingling: Yes; L>R foot; L-sided sensory loss in arm/leg  Focal Weakness: Yes; weakness L>RLE in thighs/legs Recent changes in overall health/medication: No recent changes in medication  Prior history of physical therapy for balance:  Yes; previous episode of PT for balance  Falls: Has patient fallen in last 6 months? Yes, Number of falls: 5; some back pain after having fall  Directional pattern for falls: Yes; falling forward with BLE buckling  Imaging: Yes ;  03/09/23 Head CT  1. No acute intracranial abnormality.    Prior level of function: Independent with community mobility without device  Occupational demands: Disabled  Hobbies: None stated  Red flags (bowel/bladder  changes, saddle paresthesia, personal history of cancer, h/o spinal tumors, h/o compression fx, h/o abdominal aneurysm, abdominal pain, chills/fever, night sweats, nausea, vomiting, unrelenting pain): Negative  Precautions: None  Weight Bearing Restrictions: No  Living Environment Lives with: lives alone; pt has medical pull cords in house to get emergency care prn.  Lives in: Apartment/assisted living complex Has following equipment at home: None   Patient Goals: Improve dragging LLE and weakness/stiffness of her upper limb     OBJECTIVE:   Patient Surveys  FOTO: 44, predicted improvement to 52 ABC (07/29/23):    Cognition Patient is oriented to person, place, and time.  Recent memory is intact.  Remote memory is intact.  Attention span and concentration are intact.  Expressive speech is intact.  Patient's fund of knowledge is within normal limits for educational level.     Gross Musculoskeletal Assessment Tremor: None Bulk: Normal Tone: Normal   GAIT: Distance walked: 40 ft Assistive device utilized: None Level of assistance: SBA Comments: Abductor lurch with R>L stance phase, decreased step length, dec stance time on LLE,    Posture: Self-selected sacral sitting  and thoracolumbar kyphosis in static sitting/standing position    AROM  AROM (Normal range in degrees) AROM  07/27/2023  Lumbar   Flexion (65)   Extension (30)   Right lateral flexion (25)   Left lateral flexion (25)   Right rotation (30)   Left rotation (30)       Hip Right Left  Flexion (125)    Extension (15)    Abduction (40)    Adduction     Internal Rotation (45)    External Rotation (45)        Knee    Flexion (135)    Extension (0)        Ankle    Dorsiflexion (20)    Plantarflexion (50)    Inversion (35)    Eversion (15    (* = pain; Blank rows = not tested)   LE MMT:  MMT (out of 5) Right 07/27/2023 Left 07/27/2023  Hip flexion 4 3+  Hip extension    Hip abduction (seated) 4 4-  Hip adduction    Hip internal rotation    Hip external rotation    Knee flexion 4 4-  Knee extension 4 4-  Ankle dorsiflexion 4 4-  Ankle plantarflexion    Ankle inversion    Ankle eversion    (* = pain; Blank rows = not tested)   Sensation Sensory loss along fibular head and medial malleolus bilat. Proprioception, and hot/cold testing deferred on this date.   Reflexes Deferred   Cranial Nerves Visual acuity and visual fields are intact  Convergence insuffiencey Extraocular muscles are intact  Facial sensation is intact bilaterally  Facial strength is intact bilaterally  Hearing is normal as tested by gross conversation Palate elevates midline, normal phonation  Shoulder shrug strength is intact  Tongue protrudes midline   Coordination/Cerebellar Finger to Nose: WNL Heel to Shin: WNL Rapid alternating movements: WNL Finger Opposition: WNL Pronator Drift: Negative   FUNCTIONAL OUTCOME MEASURES   Results Comments  BERG Next visit/56   DGI Next visit/24   TUG 28.5 seconds Fall risk, in need of intervention  5TSTS Unable to obtain baseline, difficulty with sit to stand   (Blank rows = not tested)    TODAY'S TREATMENT     Neuromuscular  Re-education - for improved sensory integration, static and dynamic postural control, equilibrium and non-equilibrium coordination as needed  for negotiating home and community environment and stepping over obstacles   Completion of BERG, DGI, Romberg, mCTSIB     Therapeutic Exercise - improved strength as needed to improve performance of CKC activities/functional movements and as needed for power production to prevent fall during episode of large postural perturbation      PATIENT EDUCATION:  Education details: see above for patient education details Person educated: Patient Education method: Explanation, Demonstration, and Handouts Education comprehension: verbalized understanding and returned demonstration   HOME EXERCISE PROGRAM: Access Code: [email or web address] URL: [email or web address] Date: 07/27/2023 Prepared by: Consuela Mimes  Exercises - Seated March  - 2 x daily - 7 x weekly - 2 sets - 10 reps - Seated Long Arc Quad  - 2 x daily - 7 x weekly - 2 sets - 10 reps   ASSESSMENT:  CLINICAL IMPRESSION: Patient is a 57 y.o. female who was seen today for physical therapy evaluation and treatment for imbalance and difficulty c gait with Hx of falls. Pt reports episodes of L>RLE buckling with standing/ambulatory activity. Pt has L hemiparesis without hx of cerebrovascular accident or CNS trauma; pt does have significant seizure history and significant psych component. Pt participates well with PT today, but she does appear to be under notable distress during examination. Objective testing is limited today due to re-direction being required during exam. Objective impairments include Abnormal gait, decreased activity tolerance, decreased balance, difficulty walking, decreased strength, postural dysfunction, and pain. These impairments are limiting patient from meal prep, cleaning, laundry, shopping, and community activity. Personal factors including Past/current experiences and  3+ comorbidities: (anxiety, schizophrenia, Hx of MI, seizure disorder)  are also affecting patient's functional outcome. Patient will benefit from skilled PT to address above impairments and improve overall function.  REHAB POTENTIAL: Fair due to significant psych component and L-sided sensory/motor changes  CLINICAL DECISION MAKING: Unstable/unpredictable  EVALUATION COMPLEXITY: High   GOALS: Goals reviewed with patient? Yes  SHORT TERM GOALS: Target date: 08/17/2023  Pt will be independent with HEP in order to improve strength and balance in order to decrease fall risk and improve function at home. Baseline: 07/27/23: Baseline HEP initiated.  Goal status: INITIAL  Pt will perform independent sit to stand with no upper extremity support as needed for home-level and community-level mobility and indicative of improved functional LE strength  Baseline: 07/27/23: Difficulty with sit to stand and heavy reliance on pushing from armrests.  Goal status: INITIAL   LONG TERM GOALS: Target date: 09/21/2023  Pt will increase FOTO to at least 52 to demonstrate significant improvement in function at home related to balance  Baseline: 07/27/23: 44 Goal status: INITIAL  2.  Pt will improve BERG by at least 3 points in order to demonstrate clinically significant improvement in balance.   Baseline: 07/27/23: To be performed on visit # 2.  Goal status: INITIAL  3.  Pt will improve ABC by at least 13% in order to demonstrate clinically significant improvement in balance confidence.      Baseline: 07/27/23: To be completed on visit # 2.  Goal status: INITIAL  4. Pt will improve DGI by at least 3 points in order to demonstrate clinically significant improvement in balance and decreased risk for falls.     Baseline: 07/27/23: To be performed on visit # 2.  Goal status: INITIAL  5. Pt will decrease TUG to below 14 seconds/decrease in order to demonstrate decreased fall risk.  Baseline: 07/27/23: 28.5  sec Goal status: INITIAL    PLAN: PT  FREQUENCY: 2x/week  PT DURATION: 12 weeks  PLANNED INTERVENTIONS: Therapeutic exercises, Therapeutic activity, Neuromuscular re-education, Balance training, Gait training, Patient/Family education, Joint manipulation, Joint mobilization, Canalith repositioning, Aquatic Therapy, Dry Needling, Cognitive remediation, Electrical stimulation, Spinal manipulation, Spinal mobilization, Cryotherapy, Moist heat, Traction, Ultrasound, Ionotophoresis 4mg /ml Dexamethasone, and Manual therapy  PLAN FOR NEXT SESSION: Test BERG, DGI, Romberg, mCTSIB. Discuss home exercises pt can use for back and hip mobility. Update HEP for LE strength and balance with successive visits.    Consuela Mimes, PT, DPT #[id number]  Kelly [name] 07/29/2023, 8:32 AM

## 2023-08-03 ENCOUNTER — Ambulatory Visit: Payer: 59 | Admitting: Physical Therapy

## 2023-08-03 NOTE — Therapy (Deleted)
OUTPATIENT PHYSICAL THERAPY TREATMENT   Patient Name: Kelly [name] MRN: [id number] DOB:Oct 21, 1966, 57 y.o., female Today's Date: 08/03/23      Past Medical History:  Diagnosis Date   Acid reflux    Anxiety    Hypercholesteremia    Myocardial infarction (HCC)    Schizophrenia (HCC)    Seizures (HCC)    Past Surgical History:  Procedure Laterality Date   CARDIAC CATHETERIZATION     CESAREAN SECTION     DILATION AND CURETTAGE OF UTERUS     FOOT SURGERY     TONSILLECTOMY     Patient Active Problem List   Diagnosis Date Noted   Left-sided weakness 07/11/2018   Schizoaffective disorder (HCC) 04/08/2016   Acute anxiety 04/08/2016    PCP: Morene Crocker, MD  REFERRING PROVIDER: [facility]  REFERRING DIAGNOSIS:  R26.89 (ICD-10-CM) - Imbalance     THERAPY DIAG: Imbalance  Difficulty in walking, not elsewhere classified  Muscle weakness (generalized)  ONSET DATE: 05/26/23 (worsening imbalance in last 2 months)  FOLLOW UP APPT WITH PROVIDER: Yes ; 08/26/23   RATIONALE FOR EVALUATION AND TREATMENT: Rehabilitation  SUBJECTIVE:                                                                                                                                                                                          Pertinent History  Pt is a 57 year old female referred for imbalance with Hx of seizure disorder, schizophrenia, anxiety, MI. Pt has worsening imbalance; difficulty with going up stairs. Difficulty picking up feet when walking. Pt reports episodes of her legs giving out. Pt reports L lower extremity and L upper extremity discomfort; pt has L-sided weakness. Patient reports no Hx of TIA/CVA. Patient reports episode of lower extremities buckling when standing on her porch. Patient reports some sensory change/numbness affecting her L side; she reports paresthesias affecting L>R foot.  Pt does have notable seizure disorder.    Pain: Yes; L knee, L  shoulder Numbness/Tingling: Yes; L>R foot; L-sided sensory loss in arm/leg  Focal Weakness: Yes; weakness L>RLE in thighs/legs Recent changes in overall health/medication: No recent changes in medication  Prior history of physical therapy for balance:  Yes; previous episode of PT for balance  Falls: Has patient fallen in last 6 months? Yes, Number of falls: 5; some back pain after having fall  Directional pattern for falls: Yes; falling forward with BLE buckling  Imaging: Yes ;  03/09/23 Head CT  1. No acute intracranial abnormality.    Prior level of function: Independent with community mobility without device  Occupational demands: Disabled  Hobbies: None stated  Red flags (bowel/bladder  changes, saddle paresthesia, personal history of cancer, h/o spinal tumors, h/o compression fx, h/o abdominal aneurysm, abdominal pain, chills/fever, night sweats, nausea, vomiting, unrelenting pain): Negative  Precautions: None  Weight Bearing Restrictions: No  Living Environment Lives with: lives alone; pt has medical pull cords in house to get emergency care prn.  Lives in: Apartment/assisted living complex Has following equipment at home: None   Patient Goals: Improve dragging LLE and weakness/stiffness of her upper limb     OBJECTIVE:   Patient Surveys  FOTO: 44, predicted improvement to 52 ABC (07/29/23):    Cognition Patient is oriented to person, place, and time.  Recent memory is intact.  Remote memory is intact.  Attention span and concentration are intact.  Expressive speech is intact.  Patient's fund of knowledge is within normal limits for educational level.     Gross Musculoskeletal Assessment Tremor: None Bulk: Normal Tone: Normal   GAIT: Distance walked: 40 ft Assistive device utilized: None Level of assistance: SBA Comments: Abductor lurch with R>L stance phase, decreased step length, dec stance time on LLE,    Posture: Self-selected sacral sitting  and thoracolumbar kyphosis in static sitting/standing position    AROM  AROM (Normal range in degrees) AROM  07/27/2023  Lumbar   Flexion (65)   Extension (30)   Right lateral flexion (25)   Left lateral flexion (25)   Right rotation (30)   Left rotation (30)       Hip Right Left  Flexion (125)    Extension (15)    Abduction (40)    Adduction     Internal Rotation (45)    External Rotation (45)        Knee    Flexion (135)    Extension (0)        Ankle    Dorsiflexion (20)    Plantarflexion (50)    Inversion (35)    Eversion (15    (* = pain; Blank rows = not tested)   LE MMT:  MMT (out of 5) Right 07/27/2023 Left 07/27/2023  Hip flexion 4 3+  Hip extension    Hip abduction (seated) 4 4-  Hip adduction    Hip internal rotation    Hip external rotation    Knee flexion 4 4-  Knee extension 4 4-  Ankle dorsiflexion 4 4-  Ankle plantarflexion    Ankle inversion    Ankle eversion    (* = pain; Blank rows = not tested)   Sensation Sensory loss along fibular head and medial malleolus bilat. Proprioception, and hot/cold testing deferred on this date.   Reflexes Deferred   Cranial Nerves Visual acuity and visual fields are intact  Convergence insuffiencey Extraocular muscles are intact  Facial sensation is intact bilaterally  Facial strength is intact bilaterally  Hearing is normal as tested by gross conversation Palate elevates midline, normal phonation  Shoulder shrug strength is intact  Tongue protrudes midline   Coordination/Cerebellar Finger to Nose: WNL Heel to Shin: WNL Rapid alternating movements: WNL Finger Opposition: WNL Pronator Drift: Negative   FUNCTIONAL OUTCOME MEASURES   Results Comments  BERG Next visit/56   DGI Next visit/24   TUG 28.5 seconds Fall risk, in need of intervention  5TSTS Unable to obtain baseline, difficulty with sit to stand   (Blank rows = not tested)    TODAY'S TREATMENT     Neuromuscular  Re-education - for improved sensory integration, static and dynamic postural control, equilibrium and non-equilibrium coordination as needed  for negotiating home and community environment and stepping over obstacles   Completion of BERG, DGI, Romberg, mCTSIB     Therapeutic Exercise - improved strength as needed to improve performance of CKC activities/functional movements and as needed for power production to prevent fall during episode of large postural perturbation      PATIENT EDUCATION:  Education details: see above for patient education details Person educated: Patient Education method: Explanation, Demonstration, and Handouts Education comprehension: verbalized understanding and returned demonstration   HOME EXERCISE PROGRAM: Access Code: [email or web address] URL: [email or web address] Date: 07/27/2023 Prepared by: Consuela Mimes  Exercises - Seated March  - 2 x daily - 7 x weekly - 2 sets - 10 reps - Seated Long Arc Quad  - 2 x daily - 7 x weekly - 2 sets - 10 reps   ASSESSMENT:  CLINICAL IMPRESSION: Patient is a 57 y.o. female who was seen today for physical therapy evaluation and treatment for imbalance and difficulty c gait with Hx of falls. Pt reports episodes of L>RLE buckling with standing/ambulatory activity. Pt has L hemiparesis without hx of cerebrovascular accident or CNS trauma; pt does have significant seizure history and significant psych component. Pt participates well with PT today, but she does appear to be under notable distress during examination. Objective testing is limited today due to re-direction being required during exam. Objective impairments include Abnormal gait, decreased activity tolerance, decreased balance, difficulty walking, decreased strength, postural dysfunction, and pain. These impairments are limiting patient from meal prep, cleaning, laundry, shopping, and community activity. Personal factors including Past/current experiences and  3+ comorbidities: (anxiety, schizophrenia, Hx of MI, seizure disorder)  are also affecting patient's functional outcome. Patient will benefit from skilled PT to address above impairments and improve overall function.  REHAB POTENTIAL: Fair due to significant psych component and L-sided sensory/motor changes  CLINICAL DECISION MAKING: Unstable/unpredictable  EVALUATION COMPLEXITY: High   GOALS: Goals reviewed with patient? Yes  SHORT TERM GOALS: Target date: 08/17/2023  Pt will be independent with HEP in order to improve strength and balance in order to decrease fall risk and improve function at home. Baseline: 07/27/23: Baseline HEP initiated.  Goal status: INITIAL  Pt will perform independent sit to stand with no upper extremity support as needed for home-level and community-level mobility and indicative of improved functional LE strength  Baseline: 07/27/23: Difficulty with sit to stand and heavy reliance on pushing from armrests.  Goal status: INITIAL   LONG TERM GOALS: Target date: 09/21/2023  Pt will increase FOTO to at least 52 to demonstrate significant improvement in function at home related to balance  Baseline: 07/27/23: 44 Goal status: INITIAL  2.  Pt will improve BERG by at least 3 points in order to demonstrate clinically significant improvement in balance.   Baseline: 07/27/23: To be performed on visit # 2.  Goal status: INITIAL  3.  Pt will improve ABC by at least 13% in order to demonstrate clinically significant improvement in balance confidence.      Baseline: 07/27/23: To be completed on visit # 2.  Goal status: INITIAL  4. Pt will improve DGI by at least 3 points in order to demonstrate clinically significant improvement in balance and decreased risk for falls.     Baseline: 07/27/23: To be performed on visit # 2.  Goal status: INITIAL  5. Pt will decrease TUG to below 14 seconds/decrease in order to demonstrate decreased fall risk.  Baseline: 07/27/23: 28.5  sec Goal status: INITIAL    PLAN: PT  FREQUENCY: 2x/week  PT DURATION: 12 weeks  PLANNED INTERVENTIONS: Therapeutic exercises, Therapeutic activity, Neuromuscular re-education, Balance training, Gait training, Patient/Family education, Joint manipulation, Joint mobilization, Canalith repositioning, Aquatic Therapy, Dry Needling, Cognitive remediation, Electrical stimulation, Spinal manipulation, Spinal mobilization, Cryotherapy, Moist heat, Traction, Ultrasound, Ionotophoresis 4mg /ml Dexamethasone, and Manual therapy  PLAN FOR NEXT SESSION: Test BERG, DGI, Romberg, mCTSIB. Discuss home exercises pt can use for back and hip mobility. Update HEP for LE strength and balance with successive visits.    Consuela Mimes, PT, DPT #[id number]  Gertie Exon 08/03/2023, 8:07 AM

## 2023-08-05 ENCOUNTER — Ambulatory Visit: Payer: 59 | Admitting: Physical Therapy

## 2023-08-10 ENCOUNTER — Encounter: Payer: Self-pay | Admitting: Physical Therapy

## 2023-08-10 ENCOUNTER — Ambulatory Visit: Payer: 59 | Admitting: Physical Therapy

## 2023-08-10 ENCOUNTER — Encounter: Payer: 59 | Admitting: Physical Therapy

## 2023-08-10 DIAGNOSIS — M6281 Muscle weakness (generalized): Secondary | ICD-10-CM

## 2023-08-10 DIAGNOSIS — R262 Difficulty in walking, not elsewhere classified: Secondary | ICD-10-CM | POA: Diagnosis not present

## 2023-08-10 DIAGNOSIS — R2689 Other abnormalities of gait and mobility: Secondary | ICD-10-CM

## 2023-08-10 NOTE — Therapy (Unsigned)
OUTPATIENT PHYSICAL THERAPY TREATMENT   Patient Name: Kelly [name] MRN: [id number] DOB:02-Nov-1966, 57 y.o., female Today's Date: 08/10/23    PT End of Session - 08/10/23 1047     Visit Number 2    Number of Visits 17    Date for PT Re-Evaluation 09/23/23    Authorization Type UHC Medicare/Medicaid [id number]    PT Start Time 1038    PT Stop Time 1118    PT Time Calculation (min) 40 min    Activity Tolerance Patient limited by pain    Behavior During Therapy Anxious   Distressed             Past Medical History:  Diagnosis Date   Acid reflux    Anxiety    Hypercholesteremia    Myocardial infarction (HCC)    Schizophrenia (HCC)    Seizures (HCC)    Past Surgical History:  Procedure Laterality Date   CARDIAC CATHETERIZATION     CESAREAN SECTION     DILATION AND CURETTAGE OF UTERUS     FOOT SURGERY     TONSILLECTOMY     Patient Active Problem List   Diagnosis Date Noted   Left-sided weakness 07/11/2018   Schizoaffective disorder (HCC) 04/08/2016   Acute anxiety 04/08/2016    PCP: Morene Crocker, MD  REFERRING PROVIDER: [facility]  REFERRING DIAGNOSIS:  R26.89 (ICD-10-CM) - Imbalance     THERAPY DIAG: Imbalance  Difficulty in walking, not elsewhere classified  Muscle weakness (generalized)  ONSET DATE: 05/26/23 (worsening imbalance in last 2 months)  FOLLOW UP APPT WITH PROVIDER: Yes ; 08/26/23   RATIONALE FOR EVALUATION AND TREATMENT: Rehabilitation  SUBJECTIVE:                                                                                                                                                                                          Pertinent History  Pt is a 57 year old female referred for imbalance with Hx of seizure disorder, schizophrenia, anxiety, MI. Pt has worsening imbalance; difficulty with going up stairs. Difficulty picking up feet when walking. Pt reports episodes of her legs giving out. Pt reports L lower extremity  and L upper extremity discomfort; pt has L-sided weakness. Patient reports no Hx of TIA/CVA. Patient reports episode of lower extremities buckling when standing on her porch. Patient reports some sensory change/numbness affecting her L side; she reports paresthesias affecting L>R foot.  Pt does have notable seizure disorder.    Pain: Yes; L knee, L shoulder Numbness/Tingling: Yes; L>R foot; L-sided sensory loss in arm/leg  Focal Weakness: Yes; weakness L>RLE in thighs/legs Recent changes in overall health/medication: No recent  changes in medication  Prior history of physical therapy for balance:  Yes; previous episode of PT for balance  Falls: Has patient fallen in last 6 months? Yes, Number of falls: 5; some back pain after having fall  Directional pattern for falls: Yes; falling forward with BLE buckling  Imaging: Yes ;  03/09/23 Head CT  1. No acute intracranial abnormality.    Prior level of function: Independent with community mobility without device  Occupational demands: Disabled  Hobbies: None stated  Red flags (bowel/bladder changes, saddle paresthesia, personal history of cancer, h/o spinal tumors, h/o compression fx, h/o abdominal aneurysm, abdominal pain, chills/fever, night sweats, nausea, vomiting, unrelenting pain): Negative  Precautions: None  Weight Bearing Restrictions: No  Living Environment Lives with: lives alone; pt has medical pull cords in house to get emergency care prn.  Lives in: Apartment/assisted living complex Has following equipment at home: None   Patient Goals: Improve dragging LLE and weakness/stiffness of her upper limb     OBJECTIVE:   Patient Surveys  FOTO: 44, predicted improvement to 52 ABC (07/29/23):    Cognition Patient is oriented to person, place, and time.  Recent memory is intact.  Remote memory is intact.  Attention span and concentration are intact.  Expressive speech is intact.  Patient's fund of knowledge is within  normal limits for educational level.     Gross Musculoskeletal Assessment Tremor: None Bulk: Normal Tone: Normal   GAIT: Distance walked: 40 ft Assistive device utilized: None Level of assistance: SBA Comments: Abductor lurch with R>L stance phase, decreased step length, dec stance time on LLE,    Posture: Self-selected sacral sitting and thoracolumbar kyphosis in static sitting/standing position    AROM  AROM (Normal range in degrees) AROM  07/27/2023  Lumbar   Flexion (65)   Extension (30)   Right lateral flexion (25)   Left lateral flexion (25)   Right rotation (30)   Left rotation (30)       Hip Right Left  Flexion (125)    Extension (15)    Abduction (40)    Adduction     Internal Rotation (45)    External Rotation (45)        Knee    Flexion (135)    Extension (0)        Ankle    Dorsiflexion (20)    Plantarflexion (50)    Inversion (35)    Eversion (15    (* = pain; Blank rows = not tested)   LE MMT:  MMT (out of 5) Right 07/27/2023 Left 07/27/2023  Hip flexion 4 3+  Hip extension    Hip abduction (seated) 4 4-  Hip adduction    Hip internal rotation    Hip external rotation    Knee flexion 4 4-  Knee extension 4 4-  Ankle dorsiflexion 4 4-  Ankle plantarflexion    Ankle inversion    Ankle eversion    (* = pain; Blank rows = not tested)   Sensation Sensory loss along fibular head and medial malleolus bilat. Proprioception, and hot/cold testing deferred on this date.   Reflexes Deferred   Cranial Nerves Visual acuity and visual fields are intact  Convergence insuffiencey Extraocular muscles are intact  Facial sensation is intact bilaterally  Facial strength is intact bilaterally  Hearing is normal as tested by gross conversation Palate elevates midline, normal phonation  Shoulder shrug strength is intact  Tongue protrudes midline   Coordination/Cerebellar Finger to Nose: WNL  Heel to Shin: WNL Rapid alternating  movements: WNL Finger Opposition: WNL Pronator Drift: Negative   FUNCTIONAL OUTCOME MEASURES   Results Comments  BERG Next visit/56   DGI Next visit/24   TUG 28.5 seconds Fall risk, in need of intervention  5TSTS Unable to obtain baseline, difficulty with sit to stand   (Blank rows = not tested)    TODAY'S TREATMENT    Therapeutic Exercise - improved strength as needed to improve performance of CKC activities/functional movements and as needed for power production to prevent fall during episode of large postural perturbation    NuStep; x 5 minutes, Level 1 for 3 min, Level 0 for 2 minutes  -using LE only and resting back against backrest    Neuromuscular Re-education - for improved sensory integration, static and dynamic postural control, equilibrium and non-equilibrium coordination as needed for negotiating home and community environment and stepping over obstacles   Completion of BERG, DGI, Romberg, mCTSIB    [facility] PT Assessment - 08/10/23 0001       Standardized Balance Assessment   Standardized Balance Assessment Berg Balance Test      Berg Balance Test   Sit to Stand Able to stand  independently using hands    Standing Unsupported Able to stand 2 minutes with supervision    Sitting with Back Unsupported but Feet Supported on Floor or Stool Able to sit safely and securely 2 minutes    Stand to Sit Uses backs of legs against chair to control descent    Transfers Able to transfer safely, definite need of hands    Standing Unsupported with Eyes Closed Able to stand 10 seconds with supervision    Standing Unsupported with Feet Together Able to place feet together independently but unable to hold for 30 seconds    From Standing, Reach Forward with Outstretched Arm Can reach forward >12 cm safely (5")    From Standing Position, Pick up Object from Floor Able to pick up shoe, needs supervision    From Standing Position, Turn to Look Behind Over each Shoulder Looks behind  from both sides and weight shifts well    Turn 360 Degrees Able to turn 360 degrees safely but slowly    Standing Unsupported, Alternately Place Feet on Step/Stool Able to stand independently and complete 8 steps >20 seconds    Standing Unsupported, One Foot in Front Able to plae foot ahead of the other independently and hold 30 seconds    Standing on One Leg Tries to lift leg/unable to hold 3 seconds but remains standing independently    Total Score 39             Romberg EO WNL, Romberg EC impaired  PATIENT EDUCATION:  Education details: see above for patient education details Person educated: Patient Education method: Explanation, Demonstration, and Handouts Education comprehension: verbalized understanding and returned demonstration   HOME EXERCISE PROGRAM: Access Code: [email or web address] URL: [email or web address] Date: 07/27/2023 Prepared by: Consuela Mimes  Exercises - Seated March  - 2 x daily - 7 x weekly - 2 sets - 10 reps - Seated Long Arc Quad  - 2 x daily - 7 x weekly - 2 sets - 10 reps   ASSESSMENT:  CLINICAL IMPRESSION: Patient is a 57 y.o. female who was seen today for physical therapy evaluation and treatment for imbalance and difficulty c gait with Hx of falls. Pt reports episodes of L>RLE buckling with standing/ambulatory activity. Pt has L hemiparesis without hx of cerebrovascular accident or CNS  trauma; pt does have significant seizure history and significant psych component. Pt participates well with PT today, but she does appear to be under notable distress during examination. Objective testing is limited today due to re-direction being required during exam. Objective impairments include Abnormal gait, decreased activity tolerance, decreased balance, difficulty walking, decreased strength, postural dysfunction, and pain. These impairments are limiting patient from meal prep, cleaning, laundry, shopping, and community activity. Personal factors including  Past/current experiences and 3+ comorbidities: (anxiety, schizophrenia, Hx of MI, seizure disorder)  are also affecting patient's functional outcome. Patient will benefit from skilled PT to address above impairments and improve overall function.  REHAB POTENTIAL: Fair due to significant psych component and L-sided sensory/motor changes  CLINICAL DECISION MAKING: Unstable/unpredictable  EVALUATION COMPLEXITY: High   GOALS: Goals reviewed with patient? Yes  SHORT TERM GOALS: Target date: 08/17/2023  Pt will be independent with HEP in order to improve strength and balance in order to decrease fall risk and improve function at home. Baseline: 07/27/23: Baseline HEP initiated.  Goal status: INITIAL  Pt will perform independent sit to stand with no upper extremity support as needed for home-level and community-level mobility and indicative of improved functional LE strength  Baseline: 07/27/23: Difficulty with sit to stand and heavy reliance on pushing from armrests.  Goal status: INITIAL   LONG TERM GOALS: Target date: 09/21/2023  Pt will increase FOTO to at least 52 to demonstrate significant improvement in function at home related to balance  Baseline: 07/27/23: 44 Goal status: INITIAL  2.  Pt will improve BERG by at least 3 points in order to demonstrate clinically significant improvement in balance.   Baseline: 07/27/23: To be performed on visit # 2.  Goal status: INITIAL  3.  Pt will improve ABC by at least 13% in order to demonstrate clinically significant improvement in balance confidence.      Baseline: 07/27/23: To be completed on visit # 2.  Goal status: INITIAL  4. Pt will improve DGI by at least 3 points in order to demonstrate clinically significant improvement in balance and decreased risk for falls.     Baseline: 07/27/23: To be performed on visit # 2.  Goal status: INITIAL  5. Pt will decrease TUG to below 14 seconds/decrease in order to demonstrate decreased fall risk.   Baseline: 07/27/23: 28.5 sec Goal status: INITIAL    PLAN: PT FREQUENCY: 2x/week  PT DURATION: 12 weeks  PLANNED INTERVENTIONS: Therapeutic exercises, Therapeutic activity, Neuromuscular re-education, Balance training, Gait training, Patient/Family education, Joint manipulation, Joint mobilization, Canalith repositioning, Aquatic Therapy, Dry Needling, Cognitive remediation, Electrical stimulation, Spinal manipulation, Spinal mobilization, Cryotherapy, Moist heat, Traction, Ultrasound, Ionotophoresis 4mg /ml Dexamethasone, and Manual therapy  PLAN FOR NEXT SESSION: Test BERG, DGI, Romberg, mCTSIB. Discuss home exercises pt can use for back and hip mobility. Update HEP for LE strength and balance with successive visits.    Consuela Mimes, PT, DPT #[id number]  Gertie Exon 08/10/2023, 11:15 AM

## 2023-08-12 ENCOUNTER — Encounter: Payer: 59 | Admitting: Physical Therapy

## 2023-08-12 ENCOUNTER — Ambulatory Visit: Payer: 59 | Admitting: Physical Therapy

## 2023-08-12 DIAGNOSIS — M6281 Muscle weakness (generalized): Secondary | ICD-10-CM

## 2023-08-12 DIAGNOSIS — R2689 Other abnormalities of gait and mobility: Secondary | ICD-10-CM

## 2023-08-12 DIAGNOSIS — R262 Difficulty in walking, not elsewhere classified: Secondary | ICD-10-CM | POA: Diagnosis not present

## 2023-08-16 ENCOUNTER — Encounter: Payer: Self-pay | Admitting: Physical Therapy

## 2023-08-17 ENCOUNTER — Encounter: Payer: Self-pay | Admitting: Physical Therapy

## 2023-08-17 ENCOUNTER — Encounter: Payer: 59 | Admitting: Physical Therapy

## 2023-08-17 ENCOUNTER — Ambulatory Visit: Payer: 59 | Attending: Neurology | Admitting: Physical Therapy

## 2023-08-17 DIAGNOSIS — R2689 Other abnormalities of gait and mobility: Secondary | ICD-10-CM | POA: Insufficient documentation

## 2023-08-17 DIAGNOSIS — M6281 Muscle weakness (generalized): Secondary | ICD-10-CM | POA: Insufficient documentation

## 2023-08-17 DIAGNOSIS — R262 Difficulty in walking, not elsewhere classified: Secondary | ICD-10-CM | POA: Insufficient documentation

## 2023-08-17 NOTE — Therapy (Signed)
OUTPATIENT PHYSICAL THERAPY TREATMENT   Patient Name: Kelly [name] MRN: [id number] DOB:1966/11/02, 57 y.o., female Today's Date: 08/17/23    PT End of Session - 08/17/23 1025     Visit Number 4    Number of Visits 17    Date for PT Re-Evaluation 09/23/23    Authorization Type UHC Medicare/Medicaid 2024    PT Start Time 1030    PT Stop Time 1113    PT Time Calculation (min) 43 min    Activity Tolerance Patient limited by pain    Behavior During Therapy Anxious   Distressed              Past Medical History:  Diagnosis Date   Acid reflux    Anxiety    Hypercholesteremia    Myocardial infarction (HCC)    Schizophrenia (HCC)    Seizures (HCC)    Past Surgical History:  Procedure Laterality Date   CARDIAC CATHETERIZATION     CESAREAN SECTION     DILATION AND CURETTAGE OF UTERUS     FOOT SURGERY     TONSILLECTOMY     Patient Active Problem List   Diagnosis Date Noted   Left-sided weakness 07/11/2018   Schizoaffective disorder (HCC) 04/08/2016   Acute anxiety 04/08/2016    PCP: Morene Crocker, MD  REFERRING PROVIDER: [facility]  REFERRING DIAGNOSIS:  R26.89 (ICD-10-CM) - Imbalance     THERAPY DIAG: Imbalance  Difficulty in walking, not elsewhere classified  Muscle weakness (generalized)  ONSET DATE: 05/26/23 (worsening imbalance in last 2 months)  FOLLOW UP APPT WITH PROVIDER: Yes ; 08/26/23   RATIONALE FOR EVALUATION AND TREATMENT: Rehabilitation  SUBJECTIVE:                                                                                                                                                                                          Pertinent History  Pt is a 57 year old female referred for imbalance with Hx of seizure disorder, schizophrenia, anxiety, MI. Pt has worsening imbalance; difficulty with going up stairs. Difficulty picking up feet when walking. Pt reports episodes of her legs giving out. Pt reports L lower extremity  and L upper extremity discomfort; pt has L-sided weakness. Patient reports no Hx of TIA/CVA. Patient reports episode of lower extremities buckling when standing on her porch. Patient reports some sensory change/numbness affecting her L side; she reports paresthesias affecting L>R foot.  Pt does have notable seizure disorder.    Pain: Yes; L knee, L shoulder Numbness/Tingling: Yes; L>R foot; L-sided sensory loss in arm/leg  Focal Weakness: Yes; weakness L>RLE in thighs/legs Recent changes in overall health/medication: No  recent changes in medication  Prior history of physical therapy for balance:  Yes; previous episode of PT for balance  Falls: Has patient fallen in last 6 months? Yes, Number of falls: 5; some back pain after having fall  Directional pattern for falls: Yes; falling forward with BLE buckling  Imaging: Yes ;  03/09/23 Head CT  1. No acute intracranial abnormality.    Prior level of function: Independent with community mobility without device  Occupational demands: Disabled  Hobbies: None stated  Red flags (bowel/bladder changes, saddle paresthesia, personal history of cancer, h/o spinal tumors, h/o compression fx, h/o abdominal aneurysm, abdominal pain, chills/fever, night sweats, nausea, vomiting, unrelenting pain): Negative  Precautions: None  Weight Bearing Restrictions: No  Living Environment Lives with: lives alone; pt has medical pull cords in house to get emergency care prn.  Lives in: Apartment/assisted living complex Has following equipment at home: None   Patient Goals: Improve dragging LLE and weakness/stiffness of her upper limb     OBJECTIVE:   Patient Surveys  FOTO: 44, predicted improvement to 29 ABC:   Cognition Patient is oriented to person, place, and time.  Recent memory is intact.  Remote memory is intact.  Attention span and concentration are intact.  Expressive speech is intact.  Patient's fund of knowledge is within normal limits  for educational level.     Gross Musculoskeletal Assessment Tremor: None Bulk: Normal Tone: Normal   GAIT: Distance walked: 40 ft Assistive device utilized: None Level of assistance: SBA Comments: Abductor lurch with R>L stance phase, decreased step length, dec stance time on LLE,    Posture: Self-selected sacral sitting and thoracolumbar kyphosis in static sitting/standing position    AROM  AROM (Normal range in degrees) AROM  07/27/2023  Lumbar   Flexion (65)   Extension (30)   Right lateral flexion (25)   Left lateral flexion (25)   Right rotation (30)   Left rotation (30)       Hip Right Left  Flexion (125)    Extension (15)    Abduction (40)    Adduction     Internal Rotation (45)    External Rotation (45)        Knee    Flexion (135)    Extension (0)        Ankle    Dorsiflexion (20)    Plantarflexion (50)    Inversion (35)    Eversion (15    (* = pain; Blank rows = not tested)   LE MMT:  MMT (out of 5) Right 07/27/2023 Left 07/27/2023  Hip flexion 4 3+  Hip extension    Hip abduction (seated) 4 4-  Hip adduction    Hip internal rotation    Hip external rotation    Knee flexion 4 4-  Knee extension 4 4-  Ankle dorsiflexion 4 4-  Ankle plantarflexion    Ankle inversion    Ankle eversion    (* = pain; Blank rows = not tested)   Sensation Sensory loss along fibular head and medial malleolus bilat. Proprioception, and hot/cold testing deferred on this date.   Reflexes Deferred   Cranial Nerves Visual acuity and visual fields are intact  Convergence insuffiencey Extraocular muscles are intact  Facial sensation is intact bilaterally  Facial strength is intact bilaterally  Hearing is normal as tested by gross conversation Palate elevates midline, normal phonation  Shoulder shrug strength is intact  Tongue protrudes midline   Coordination/Cerebellar Finger to Nose: WNL Heel  to Shin: WNL Rapid alternating movements: WNL Finger  Opposition: WNL Pronator Drift: Negative   FUNCTIONAL OUTCOME MEASURES   Results Comments  BERG 39/56   DGI 19/24   TUG 28.5 seconds Fall risk, in need of intervention  5TSTS Unable to obtain baseline, difficulty with sit to stand   (Blank rows = not tested)   Clinical Test of Sensory Interaction for Balance    (CTSIB):  CONDITION TIME STRATEGY SWAY  Eyes open, firm surface 30 seconds Ankle   Eyes closed, firm surface 30 seconds ankle   Eyes open, foam surface 30 seconds Ankle, hip Increased c foam  Eyes closed, foam surface 9 seconds Ankle, hip       Increased c foam         TODAY'S TREATMENT    SUBJECTIVE STATEMENT:   Pt reports compliance with HEP. She reports no major updates today. She reports no recent falls/near-falls. Patient reports no new complaints this AM.     Therapeutic Exercise - improved strength as needed to improve performance of CKC activities/functional movements and as needed for power production to prevent fall during episode of large postural perturbation   NuStep; x 5 minutes, Level 1 for 5 min;  -using LE only and resting back against backrest   Minisquat; x20  -heavy guarding and cueing for technique  -significant cueing to remain in small 30-45 deg ROM; pt wishes to squat lower Standing march at table, UE support on edge of table; 2x10 alternating R/L Standing heel raise/toe raise; 2x10 alternating  -Min verbal cueing  and demo for technique   PATIENT EDUCATION: HEP update and review. Provision of new [employer] handout.     Neuromuscular Re-education - for improved sensory integration, static and dynamic postural control, equilibrium and non-equilibrium coordination as needed for negotiating home and community environment and stepping over obstacles  Dynamic march on blue agility ladder; 3x D/B length of ladder  -PT CGA  Toe tapping; 6-inch step; 2x10 alternating  Standing FT on Airex; x 1 min without notable LOB Standing  semitandem on Airex; multiple attempts with 5-10 sec intervals obtained on each side     PATIENT EDUCATION:  Education details: see above for patient education details Person educated: Patient Education method: Explanation, Demonstration, and Handouts Education comprehension: verbalized understanding and returned demonstration   HOME EXERCISE PROGRAM: Access Code: [email or web address] URL: [email or web address] Date: 08/17/2023 Prepared by: Consuela Mimes  Exercises - Seated March  - 2 x daily - 7 x weekly - 2 sets - 10 reps - Seated Long Arc Quad  - 2 x daily - 7 x weekly - 2 sets - 10 reps - Sit to Stand with Armchair  - 2 x daily - 7 x weekly - 2-3 sets - 5-6 reps - Standing March with Counter Support  - 2 x daily - 7 x weekly - 2 sets - 10 reps - Heel Toe Raises with Counter Support  - 2 x daily - 7 x weekly - 2 sets - 10 reps    ASSESSMENT:  CLINICAL IMPRESSION: Patient reports discomfort, weakness, and instability when weightbearing onto LLE. We progressed with weight shifting drills and lower extremity strengthening exercises with pt tolerating most drills well. Significant psych involvement can sometimes affect carryover of instruction and verbal cueing; pt has difficulty maintaining technique with step-over exercise and mini-squats. We updated HEP today for simple drills that are safe to perform in seat or at counter. Pt does exhibit markedly improved activity tolerance today versus her last  2 visits. Pt still has marked LE weakness and gait changes necessitating further PT intervention. Patient will benefit from skilled PT to address above impairments and improve overall function.  REHAB POTENTIAL: Fair due to significant psych component and L-sided sensory/motor changes  CLINICAL DECISION MAKING: Unstable/unpredictable  EVALUATION COMPLEXITY: High   GOALS: Goals reviewed with patient? Yes  SHORT TERM GOALS: Target date: 08/17/2023  Pt will be independent with  HEP in order to improve strength and balance in order to decrease fall risk and improve function at home. Baseline: 07/27/23: Baseline HEP initiated.  Goal status: INITIAL  Pt will perform independent sit to stand with no upper extremity support as needed for home-level and community-level mobility and indicative of improved functional LE strength  Baseline: 07/27/23: Difficulty with sit to stand and heavy reliance on pushing from armrests.  Goal status: INITIAL   LONG TERM GOALS: Target date: 09/21/2023  Pt will increase FOTO to at least 52 to demonstrate significant improvement in function at home related to balance  Baseline: 07/27/23: 44 Goal status: INITIAL  2.  Pt will improve BERG by at least 3 points in order to demonstrate clinically significant improvement in balance.   Baseline: 07/27/23: To be performed on visit # 2.    08/10/23: 39/56 Goal status: INITIAL  3.  Pt will improve ABC by at least 13% in order to demonstrate clinically significant improvement in balance confidence.      Baseline: 07/27/23: To be completed on visit # 2.   Goal status: INITIAL  4. Pt will improve DGI by at least 3 points in order to demonstrate clinically significant improvement in balance and decreased risk for falls.     Baseline: 07/27/23: To be performed on visit # 2.    08/12/23: 19/24 Goal status: INITIAL  5. Pt will decrease TUG to below 14 seconds/decrease in order to demonstrate decreased fall risk.  Baseline: 07/27/23: 28.5 sec Goal status: INITIAL    PLAN: PT FREQUENCY: 2x/week  PT DURATION: 12 weeks  PLANNED INTERVENTIONS: Therapeutic exercises, Therapeutic activity, Neuromuscular re-education, Balance training, Gait training, Patient/Family education, Joint manipulation, Joint mobilization, Canalith repositioning, Aquatic Therapy, Dry Needling, Cognitive remediation, Electrical stimulation, Spinal manipulation, Spinal mobilization, Cryotherapy, Moist heat, Traction, Ultrasound,  Ionotophoresis 4mg /ml Dexamethasone, and Manual therapy  PLAN FOR NEXT SESSION: Continue with low-level LE strengthening, postural control drills, progressive weight shifting and stepping drills as tolerated. Update HEP for LE strength and balance with successive visits.    Consuela Mimes, PT, DPT #[id number]  Gertie Exon 08/17/2023, 12:24 PM

## 2023-08-19 ENCOUNTER — Ambulatory Visit: Payer: 59 | Admitting: Physical Therapy

## 2023-08-19 ENCOUNTER — Encounter: Payer: 59 | Admitting: Physical Therapy

## 2023-08-19 ENCOUNTER — Encounter: Payer: Self-pay | Admitting: Physical Therapy

## 2023-08-19 DIAGNOSIS — M6281 Muscle weakness (generalized): Secondary | ICD-10-CM | POA: Diagnosis not present

## 2023-08-19 DIAGNOSIS — R262 Difficulty in walking, not elsewhere classified: Secondary | ICD-10-CM | POA: Diagnosis not present

## 2023-08-19 DIAGNOSIS — R2689 Other abnormalities of gait and mobility: Secondary | ICD-10-CM | POA: Diagnosis not present

## 2023-08-19 NOTE — Therapy (Addendum)
OUTPATIENT PHYSICAL THERAPY TREATMENT   Patient Name: Hailey Stormer MRN: [id number] DOB:06-02-1966, 57 y.o., female Today's Date: 08/19/23    PT End of Session - 08/23/23 0555     Visit Number 5    Number of Visits 17    Date for PT Re-Evaluation 09/23/23    Authorization Type [employer] Medicare/Medicaid [id number]    PT Start Time 1044    PT Stop Time 1123    PT Time Calculation (min) 39 min    Activity Tolerance Patient limited by pain    Behavior During Therapy Anxious   Distressed            Past Medical History:  Diagnosis Date   Acid reflux    Anxiety    Hypercholesteremia    Myocardial infarction (HCC)    Schizophrenia (HCC)    Seizures (HCC)    Past Surgical History:  Procedure Laterality Date   CARDIAC CATHETERIZATION     CESAREAN SECTION     DILATION AND CURETTAGE OF UTERUS     FOOT SURGERY     TONSILLECTOMY     Patient Active Problem List   Diagnosis Date Noted   Left-sided weakness 07/11/2018   Schizoaffective disorder (HCC) 04/08/2016   Acute anxiety 04/08/2016    PCP: Morene Crocker, MD  REFERRING PROVIDER: [facility]  REFERRING DIAGNOSIS:  R26.89 (ICD-10-CM) - Imbalance     THERAPY DIAG: Imbalance  Difficulty in walking, not elsewhere classified  Muscle weakness (generalized)  ONSET DATE: 05/26/23 (worsening imbalance in last 2 months)  FOLLOW UP APPT WITH PROVIDER: Yes ; 08/26/23   RATIONALE FOR EVALUATION AND TREATMENT: Rehabilitation  SUBJECTIVE:                                                                                                                                                                                          Pertinent History  Pt is a 57 year old female referred for imbalance with Hx of seizure disorder, schizophrenia, anxiety, MI. Pt has worsening imbalance; difficulty with going up stairs. Difficulty picking up feet when walking. Pt reports episodes of her legs giving out. Pt reports L lower extremity and  L upper extremity discomfort; pt has L-sided weakness. Patient reports no Hx of TIA/CVA. Patient reports episode of lower extremities buckling when standing on her porch. Patient reports some sensory change/numbness affecting her L side; she reports paresthesias affecting L>R foot.  Pt does have notable seizure disorder.    Pain: Yes; L knee, L shoulder Numbness/Tingling: Yes; L>R foot; L-sided sensory loss in arm/leg  Focal Weakness: Yes; weakness L>RLE in thighs/legs Recent changes in overall health/medication: No recent changes  in medication  Prior history of physical therapy for balance:  Yes; previous episode of PT for balance  Falls: Has patient fallen in last 6 months? Yes, Number of falls: 5; some back pain after having fall  Directional pattern for falls: Yes; falling forward with BLE buckling  Imaging: Yes ;  03/09/23 Head CT  1. No acute intracranial abnormality.    Prior level of function: Independent with community mobility without device  Occupational demands: Disabled  Hobbies: None stated  Red flags (bowel/bladder changes, saddle paresthesia, personal history of cancer, h/o spinal tumors, h/o compression fx, h/o abdominal aneurysm, abdominal pain, chills/fever, night sweats, nausea, vomiting, unrelenting pain): Negative  Precautions: None  Weight Bearing Restrictions: No  Living Environment Lives with: lives alone; pt has medical pull cords in house to get emergency care prn.  Lives in: Apartment/assisted living complex Has following equipment at home: None   Patient Goals: Improve dragging LLE and weakness/stiffness of her upper limb     OBJECTIVE:   Patient Surveys  FOTO: 44, predicted improvement to 71 ABC:   Cognition Patient is oriented to person, place, and time.  Recent memory is intact.  Remote memory is intact.  Attention span and concentration are intact.  Expressive speech is intact.  Patient's fund of knowledge is within normal limits for  educational level.     Gross Musculoskeletal Assessment Tremor: None Bulk: Normal Tone: Normal   GAIT: Distance walked: 40 ft Assistive device utilized: None Level of assistance: SBA Comments: Abductor lurch with R>L stance phase, decreased step length, dec stance time on LLE,    Posture: Self-selected sacral sitting and thoracolumbar kyphosis in static sitting/standing position    AROM  AROM (Normal range in degrees) AROM  07/27/2023  Lumbar   Flexion (65)   Extension (30)   Right lateral flexion (25)   Left lateral flexion (25)   Right rotation (30)   Left rotation (30)       Hip Right Left  Flexion (125)    Extension (15)    Abduction (40)    Adduction     Internal Rotation (45)    External Rotation (45)        Knee    Flexion (135)    Extension (0)        Ankle    Dorsiflexion (20)    Plantarflexion (50)    Inversion (35)    Eversion (15    (* = pain; Blank rows = not tested)   LE MMT:  MMT (out of 5) Right 07/27/2023 Left 07/27/2023  Hip flexion 4 3+  Hip extension    Hip abduction (seated) 4 4-  Hip adduction    Hip internal rotation    Hip external rotation    Knee flexion 4 4-  Knee extension 4 4-  Ankle dorsiflexion 4 4-  Ankle plantarflexion    Ankle inversion    Ankle eversion    (* = pain; Blank rows = not tested)   Sensation Sensory loss along fibular head and medial malleolus bilat. Proprioception, and hot/cold testing deferred on this date.   Reflexes Deferred   Cranial Nerves Visual acuity and visual fields are intact  Convergence insuffiencey Extraocular muscles are intact  Facial sensation is intact bilaterally  Facial strength is intact bilaterally  Hearing is normal as tested by gross conversation Palate elevates midline, normal phonation  Shoulder shrug strength is intact  Tongue protrudes midline   Coordination/Cerebellar Finger to Nose: WNL Heel to Shin:  WNL Rapid alternating movements: WNL Finger  Opposition: WNL Pronator Drift: Negative   FUNCTIONAL OUTCOME MEASURES   Results Comments  BERG 39/56   DGI 19/24   TUG 28.5 seconds Fall risk, in need of intervention  5TSTS Unable to obtain baseline, difficulty with sit to stand   (Blank rows = not tested)   Clinical Test of Sensory Interaction for Balance    (CTSIB):  CONDITION TIME STRATEGY SWAY  Eyes open, firm surface 30 seconds Ankle   Eyes closed, firm surface 30 seconds ankle   Eyes open, foam surface 30 seconds Ankle, hip Increased c foam  Eyes closed, foam surface 9 seconds Ankle, hip       Increased c foam         TODAY'S TREATMENT    SUBJECTIVE STATEMENT:   Pt reports compliance with HEP. She reports no major updates today. She reports no recent falls/near-falls. Patient reports no new complaints this AM.     Therapeutic Exercise - improved strength as needed to improve performance of CKC activities/functional movements and as needed for power production to prevent fall during episode of large postural perturbation   NuStep; x 5 minutes, Level 1 for 5 min;  -using LE only and resting back against backrest  Standing march at table, UE support on edge of table; 2x10 alternating R/L with 5-lb ankle weights    PATIENT EDUCATION: Discussed continued HEP and expectations with therapy.    *not today* Standing heel raise/toe raise; 2x10 alternating  -Min verbal cueing  and demo for technique Minisquat; x20  -heavy guarding and cueing for technique  -significant cueing to remain in small 30-45 deg ROM; pt wishes to squat lower    Neuromuscular Re-education - for improved sensory integration, static and dynamic postural control, equilibrium and non-equilibrium coordination as needed for negotiating home and community environment and stepping over obstacles  Dynamic march on blue agility ladder; 3x D/B length of ladder  -PT CGA  Single hurdle step over 10-lb cuff ankle weight; 1x15 with each LE  Toe  tapping; 6-inch step; 2x10 alternating  Forward step-over 4 consecutive 1x4 boards, in // bars; 3x D/B  -heavy cueing for sequencing and foot placement prior to step-over  -repeated demo for technique   *next visit* Standing FT on Airex; x 1 min without notable LOB Standing semitandem on Airex; multiple attempts with 5-10 sec intervals obtained on each side     PATIENT EDUCATION:  Education details: see above for patient education details Person educated: Patient Education method: Explanation, Demonstration, and Handouts Education comprehension: verbalized understanding and returned demonstration   HOME EXERCISE PROGRAM: Access Code: [email or web address] URL: [email or web address] Date: 08/17/2023 Prepared by: Consuela Mimes  Exercises - Seated March  - 2 x daily - 7 x weekly - 2 sets - 10 reps - Seated Long Arc Quad  - 2 x daily - 7 x weekly - 2 sets - 10 reps - Sit to Stand with Armchair  - 2 x daily - 7 x weekly - 2-3 sets - 5-6 reps - Standing March with Counter Support  - 2 x daily - 7 x weekly - 2 sets - 10 reps - Heel Toe Raises with Counter Support  - 2 x daily - 7 x weekly - 2 sets - 10 reps    ASSESSMENT:  CLINICAL IMPRESSION: Patient has improved in regard to activity tolerance in PT. Pt is significantly deconditioned and has comorbid back pain and marked LLE weakness limiting her current level of function.  Pt requires heavy repeated cueing and therapist demonstration to accomplish hurdle step-over and consecutive step-overs with pt having difficulty following technique and sequencing of these drills. Pt still has marked LE weakness and gait changes necessitating further PT intervention. Patient will benefit from skilled PT to address above impairments and improve overall function.  REHAB POTENTIAL: Fair due to significant psych component and L-sided sensory/motor changes  CLINICAL DECISION MAKING: Unstable/unpredictable  EVALUATION COMPLEXITY:  High   GOALS: Goals reviewed with patient? Yes  SHORT TERM GOALS: Target date: 08/17/2023  Pt will be independent with HEP in order to improve strength and balance in order to decrease fall risk and improve function at home. Baseline: 07/27/23: Baseline HEP initiated.  Goal status: INITIAL  Pt will perform independent sit to stand with no upper extremity support as needed for home-level and community-level mobility and indicative of improved functional LE strength  Baseline: 07/27/23: Difficulty with sit to stand and heavy reliance on pushing from armrests.  Goal status: INITIAL   LONG TERM GOALS: Target date: 09/21/2023  Pt will increase FOTO to at least 52 to demonstrate significant improvement in function at home related to balance  Baseline: 07/27/23: 44 Goal status: INITIAL  2.  Pt will improve BERG by at least 3 points in order to demonstrate clinically significant improvement in balance.   Baseline: 07/27/23: To be performed on visit # 2.    08/10/23: 39/56 Goal status: INITIAL  3.  Pt will improve ABC by at least 13% in order to demonstrate clinically significant improvement in balance confidence.      Baseline: 07/27/23: To be completed on visit # 2.   Goal status: INITIAL  4. Pt will improve DGI by at least 3 points in order to demonstrate clinically significant improvement in balance and decreased risk for falls.     Baseline: 07/27/23: To be performed on visit # 2.    08/12/23: 19/24 Goal status: INITIAL  5. Pt will decrease TUG to below 14 seconds/decrease in order to demonstrate decreased fall risk.  Baseline: 07/27/23: 28.5 sec Goal status: INITIAL    PLAN: PT FREQUENCY: 2x/week  PT DURATION: 12 weeks  PLANNED INTERVENTIONS: Therapeutic exercises, Therapeutic activity, Neuromuscular re-education, Balance training, Gait training, Patient/Family education, Joint manipulation, Joint mobilization, Canalith repositioning, Aquatic Therapy, Dry Needling, Cognitive  remediation, Electrical stimulation, Spinal manipulation, Spinal mobilization, Cryotherapy, Moist heat, Traction, Ultrasound, Ionotophoresis 4mg /ml Dexamethasone, and Manual therapy  PLAN FOR NEXT SESSION: Continue with low-level LE strengthening, postural control drills, progressive weight shifting and stepping drills as tolerated. Update HEP for LE strength and balance with successive visits.     Consuela Mimes, PT, DPT #[id number]  Gertie Exon 08/23/2023, 5:56 AM

## 2023-08-24 ENCOUNTER — Ambulatory Visit: Payer: 59 | Admitting: Physical Therapy

## 2023-08-24 ENCOUNTER — Encounter: Payer: 59 | Admitting: Physical Therapy

## 2023-08-26 ENCOUNTER — Encounter: Payer: 59 | Admitting: Physical Therapy

## 2023-08-26 ENCOUNTER — Ambulatory Visit: Payer: 59 | Admitting: Physical Therapy

## 2023-08-31 ENCOUNTER — Encounter: Payer: 59 | Admitting: Physical Therapy

## 2023-09-01 DIAGNOSIS — G479 Sleep disorder, unspecified: Secondary | ICD-10-CM | POA: Diagnosis not present

## 2023-09-01 DIAGNOSIS — Z87898 Personal history of other specified conditions: Secondary | ICD-10-CM | POA: Diagnosis not present

## 2023-09-01 DIAGNOSIS — R519 Headache, unspecified: Secondary | ICD-10-CM | POA: Diagnosis not present

## 2023-09-01 DIAGNOSIS — G250 Essential tremor: Secondary | ICD-10-CM | POA: Diagnosis not present

## 2023-09-01 DIAGNOSIS — R2689 Other abnormalities of gait and mobility: Secondary | ICD-10-CM | POA: Diagnosis not present

## 2023-09-02 ENCOUNTER — Encounter: Payer: 59 | Admitting: Physical Therapy

## 2023-09-07 ENCOUNTER — Ambulatory Visit: Payer: 59 | Admitting: Physical Therapy

## 2023-09-07 ENCOUNTER — Encounter: Payer: 59 | Admitting: Physical Therapy

## 2023-09-07 NOTE — Therapy (Deleted)
OUTPATIENT PHYSICAL THERAPY TREATMENT   Patient Name: Kelly [name] MRN: [id number] DOB:[birth year], 57 y.o., female Today's Date: 08/19/23      Past Medical History:  Diagnosis Date   Acid reflux    Anxiety    Hypercholesteremia    Myocardial infarction (HCC)    Schizophrenia (HCC)    Seizures (HCC)    Past Surgical History:  Procedure Laterality Date   CARDIAC CATHETERIZATION     CESAREAN SECTION     DILATION AND CURETTAGE OF UTERUS     FOOT SURGERY     TONSILLECTOMY     Patient Active Problem List   Diagnosis Date Noted   Left-sided weakness 07/11/2018   Schizoaffective disorder (HCC) 04/08/2016   Acute anxiety 04/08/2016    PCP: Morene Crocker, MD  REFERRING PROVIDER: [facility]  REFERRING DIAGNOSIS:  R26.89 (ICD-10-CM) - Imbalance     THERAPY DIAG: Imbalance  Difficulty in walking, not elsewhere classified  Muscle weakness (generalized)  ONSET DATE: 05/26/23 (worsening imbalance in last 2 months)  FOLLOW UP APPT WITH PROVIDER: Yes ; 08/26/23   RATIONALE FOR EVALUATION AND TREATMENT: Rehabilitation  SUBJECTIVE:                                                                                                                                                                                          Pertinent History  Pt is a 57 year old female referred for imbalance with Hx of seizure disorder, schizophrenia, anxiety, MI. Pt has worsening imbalance; difficulty with going up stairs. Difficulty picking up feet when walking. Pt reports episodes of her legs giving out. Pt reports L lower extremity and L upper extremity discomfort; pt has L-sided weakness. Patient reports no Hx of TIA/CVA. Patient reports episode of lower extremities buckling when standing on her porch. Patient reports some sensory change/numbness affecting her L side; she reports paresthesias affecting L>R foot.  Pt does have notable seizure disorder.    Pain: Yes; L knee, L  shoulder Numbness/Tingling: Yes; L>R foot; L-sided sensory loss in arm/leg  Focal Weakness: Yes; weakness L>RLE in thighs/legs Recent changes in overall health/medication: No recent changes in medication  Prior history of physical therapy for balance:  Yes; previous episode of PT for balance  Falls: Has patient fallen in last 6 months? Yes, Number of falls: 5; some back pain after having fall  Directional pattern for falls: Yes; falling forward with BLE buckling  Imaging: Yes ;  03/09/23 Head CT  1. No acute intracranial abnormality.    Prior level of function: Independent with community mobility without device  Occupational demands: Disabled  Hobbies: None stated  Red flags (bowel/bladder  changes, saddle paresthesia, personal history of cancer, h/o spinal tumors, h/o compression fx, h/o abdominal aneurysm, abdominal pain, chills/fever, night sweats, nausea, vomiting, unrelenting pain): Negative  Precautions: None  Weight Bearing Restrictions: No  Living Environment Lives with: lives alone; pt has medical pull cords in house to get emergency care prn.  Lives in: Apartment/assisted living complex Has following equipment at home: None   Patient Goals: Improve dragging LLE and weakness/stiffness of her upper limb     OBJECTIVE:   Patient Surveys  FOTO: 44, predicted improvement to 63 ABC:   Cognition Patient is oriented to person, place, and time.  Recent memory is intact.  Remote memory is intact.  Attention span and concentration are intact.  Expressive speech is intact.  Patient's fund of knowledge is within normal limits for educational level.     Gross Musculoskeletal Assessment Tremor: None Bulk: Normal Tone: Normal   GAIT: Distance walked: 40 ft Assistive device utilized: None Level of assistance: SBA Comments: Abductor lurch with R>L stance phase, decreased step length, dec stance time on LLE,    Posture: Self-selected sacral sitting and  thoracolumbar kyphosis in static sitting/standing position    AROM  AROM (Normal range in degrees) AROM  07/27/2023  Lumbar   Flexion (65)   Extension (30)   Right lateral flexion (25)   Left lateral flexion (25)   Right rotation (30)   Left rotation (30)       Hip Right Left  Flexion (125)    Extension (15)    Abduction (40)    Adduction     Internal Rotation (45)    External Rotation (45)        Knee    Flexion (135)    Extension (0)        Ankle    Dorsiflexion (20)    Plantarflexion (50)    Inversion (35)    Eversion (15    (* = pain; Blank rows = not tested)   LE MMT:  MMT (out of 5) Right 07/27/2023 Left 07/27/2023  Hip flexion 4 3+  Hip extension    Hip abduction (seated) 4 4-  Hip adduction    Hip internal rotation    Hip external rotation    Knee flexion 4 4-  Knee extension 4 4-  Ankle dorsiflexion 4 4-  Ankle plantarflexion    Ankle inversion    Ankle eversion    (* = pain; Blank rows = not tested)   Sensation Sensory loss along fibular head and medial malleolus bilat. Proprioception, and hot/cold testing deferred on this date.   Reflexes Deferred   Cranial Nerves Visual acuity and visual fields are intact  Convergence insuffiencey Extraocular muscles are intact  Facial sensation is intact bilaterally  Facial strength is intact bilaterally  Hearing is normal as tested by gross conversation Palate elevates midline, normal phonation  Shoulder shrug strength is intact  Tongue protrudes midline   Coordination/Cerebellar Finger to Nose: WNL Heel to Shin: WNL Rapid alternating movements: WNL Finger Opposition: WNL Pronator Drift: Negative   FUNCTIONAL OUTCOME MEASURES   Results Comments  BERG 39/56   DGI 19/24   TUG 28.5 seconds Fall risk, in need of intervention  5TSTS Unable to obtain baseline, difficulty with sit to stand   (Blank rows = not tested)   Clinical Test of Sensory Interaction for Balance     (CTSIB):  CONDITION TIME STRATEGY SWAY  Eyes open, firm surface 30 seconds Ankle   Eyes closed, firm  surface 30 seconds ankle   Eyes open, foam surface 30 seconds Ankle, hip Increased c foam  Eyes closed, foam surface 9 seconds Ankle, hip       Increased c foam         TODAY'S TREATMENT    SUBJECTIVE STATEMENT:   Pt reports compliance with HEP. She reports no major updates today. She reports no recent falls/near-falls. Patient reports no new complaints this AM.     Therapeutic Exercise - improved strength as needed to improve performance of CKC activities/functional movements and as needed for power production to prevent fall during episode of large postural perturbation   NuStep; x 5 minutes, Level 1 for 5 min;  -using LE only and resting back against backrest  Standing march at table, UE support on edge of table; 2x10 alternating R/L with 5-lb ankle weights    PATIENT EDUCATION: Discussed continued HEP and expectations with therapy.    *not today* Standing heel raise/toe raise; 2x10 alternating  -Min verbal cueing  and demo for technique Minisquat; x20  -heavy guarding and cueing for technique  -significant cueing to remain in small 30-45 deg ROM; pt wishes to squat lower    Neuromuscular Re-education - for improved sensory integration, static and dynamic postural control, equilibrium and non-equilibrium coordination as needed for negotiating home and community environment and stepping over obstacles  Dynamic march on blue agility ladder; 3x D/B length of ladder  -PT CGA  Single hurdle step over 10-lb cuff ankle weight; 1x15 with each LE  Toe tapping; 6-inch step; 2x10 alternating  Forward step-over 4 consecutive 1x4 boards, in // bars; 3x D/B  -heavy cueing for sequencing and foot placement prior to step-over  -repeated demo for technique   *next visit* Standing FT on Airex; x 1 min without notable LOB Standing semitandem on Airex; multiple attempts with  5-10 sec intervals obtained on each side     PATIENT EDUCATION:  Education details: see above for patient education details Person educated: Patient Education method: Explanation, Demonstration, and Handouts Education comprehension: verbalized understanding and returned demonstration   HOME EXERCISE PROGRAM: Access Code: [email or web address] URL: [email or web address] Date: 08/17/2023 Prepared by: Consuela Mimes  Exercises - Seated March  - 2 x daily - 7 x weekly - 2 sets - 10 reps - Seated Long Arc Quad  - 2 x daily - 7 x weekly - 2 sets - 10 reps - Sit to Stand with Armchair  - 2 x daily - 7 x weekly - 2-3 sets - 5-6 reps - Standing March with Counter Support  - 2 x daily - 7 x weekly - 2 sets - 10 reps - Heel Toe Raises with Counter Support  - 2 x daily - 7 x weekly - 2 sets - 10 reps    ASSESSMENT:  CLINICAL IMPRESSION: Patient has improved in regard to activity tolerance in PT. Pt is significantly deconditioned and has comorbid back pain and marked LLE weakness limiting her current level of function. Pt requires heavy repeated cueing and therapist demonstration to accomplish hurdle step-over and consecutive step-overs with pt having difficulty following technique and sequencing of these drills. Pt still has marked LE weakness and gait changes necessitating further PT intervention. Patient will benefit from skilled PT to address above impairments and improve overall function.  REHAB POTENTIAL: Fair due to significant psych component and L-sided sensory/motor changes  CLINICAL DECISION MAKING: Unstable/unpredictable  EVALUATION COMPLEXITY: High   GOALS: Goals reviewed with patient? Yes  SHORT TERM GOALS: Target  date: 08/17/2023  Pt will be independent with HEP in order to improve strength and balance in order to decrease fall risk and improve function at home. Baseline: 07/27/23: Baseline HEP initiated.  Goal status: INITIAL  Pt will perform independent sit to  stand with no upper extremity support as needed for home-level and community-level mobility and indicative of improved functional LE strength  Baseline: 07/27/23: Difficulty with sit to stand and heavy reliance on pushing from armrests.  Goal status: INITIAL   LONG TERM GOALS: Target date: 09/21/2023  Pt will increase FOTO to at least 52 to demonstrate significant improvement in function at home related to balance  Baseline: 07/27/23: 44 Goal status: INITIAL  2.  Pt will improve BERG by at least 3 points in order to demonstrate clinically significant improvement in balance.   Baseline: 07/27/23: To be performed on visit # 2.    08/10/23: 39/56 Goal status: INITIAL  3.  Pt will improve ABC by at least 13% in order to demonstrate clinically significant improvement in balance confidence.      Baseline: 07/27/23: To be completed on visit # 2.   Goal status: INITIAL  4. Pt will improve DGI by at least 3 points in order to demonstrate clinically significant improvement in balance and decreased risk for falls.     Baseline: 07/27/23: To be performed on visit # 2.    08/12/23: 19/24 Goal status: INITIAL  5. Pt will decrease TUG to below 14 seconds/decrease in order to demonstrate decreased fall risk.  Baseline: 07/27/23: 28.5 sec Goal status: INITIAL    PLAN: PT FREQUENCY: 2x/week  PT DURATION: 12 weeks  PLANNED INTERVENTIONS: Therapeutic exercises, Therapeutic activity, Neuromuscular re-education, Balance training, Gait training, Patient/Family education, Joint manipulation, Joint mobilization, Canalith repositioning, Aquatic Therapy, Dry Needling, Cognitive remediation, Electrical stimulation, Spinal manipulation, Spinal mobilization, Cryotherapy, Moist heat, Traction, Ultrasound, Ionotophoresis 4mg /ml Dexamethasone, and Manual therapy  PLAN FOR NEXT SESSION: Continue with low-level LE strengthening, postural control drills, progressive weight shifting and stepping drills as tolerated. Update  HEP for LE strength and balance with successive visits.     Consuela Mimes, PT, DPT #[id number]  Gertie Exon 09/07/2023, 10:00 AM

## 2023-09-09 ENCOUNTER — Encounter: Payer: 59 | Admitting: Physical Therapy

## 2023-09-09 ENCOUNTER — Ambulatory Visit: Payer: 59 | Admitting: Physical Therapy

## 2023-09-09 NOTE — Therapy (Deleted)
OUTPATIENT PHYSICAL THERAPY TREATMENT   Patient Name: Kelly [name] MRN: [id number] DOB:[birth year], 57 y.o., female Today's Date: 09/09/2023      Past Medical History:  Diagnosis Date   Acid reflux    Anxiety    Hypercholesteremia    Myocardial infarction (HCC)    Schizophrenia (HCC)    Seizures (HCC)    Past Surgical History:  Procedure Laterality Date   CARDIAC CATHETERIZATION     CESAREAN SECTION     DILATION AND CURETTAGE OF UTERUS     FOOT SURGERY     TONSILLECTOMY     Patient Active Problem List   Diagnosis Date Noted   Left-sided weakness 07/11/2018   Schizoaffective disorder (HCC) 04/08/2016   Acute anxiety 04/08/2016    PCP: Morene Crocker, MD  REFERRING PROVIDER: [facility]  REFERRING DIAGNOSIS:  R26.89 (ICD-10-CM) - Imbalance     THERAPY DIAG: Imbalance  Difficulty in walking, not elsewhere classified  Muscle weakness (generalized)  ONSET DATE: 05/26/23 (worsening imbalance in last 2 months)  FOLLOW UP APPT WITH PROVIDER: Yes ; 08/26/23   RATIONALE FOR EVALUATION AND TREATMENT: Rehabilitation  SUBJECTIVE:                                                                                                                                                                                          Pertinent History  Pt is a 57 year old female referred for imbalance with Hx of seizure disorder, schizophrenia, anxiety, MI. Pt has worsening imbalance; difficulty with going up stairs. Difficulty picking up feet when walking. Pt reports episodes of her legs giving out. Pt reports L lower extremity and L upper extremity discomfort; pt has L-sided weakness. Patient reports no Hx of TIA/CVA. Patient reports episode of lower extremities buckling when standing on her porch. Patient reports some sensory change/numbness affecting her L side; she reports paresthesias affecting L>R foot.  Pt does have notable seizure disorder.    Pain: Yes; L knee, L  shoulder Numbness/Tingling: Yes; L>R foot; L-sided sensory loss in arm/leg  Focal Weakness: Yes; weakness L>RLE in thighs/legs Recent changes in overall health/medication: No recent changes in medication  Prior history of physical therapy for balance:  Yes; previous episode of PT for balance  Falls: Has patient fallen in last 6 months? Yes, Number of falls: 5; some back pain after having fall  Directional pattern for falls: Yes; falling forward with BLE buckling  Imaging: Yes ;  03/09/23 Head CT  1. No acute intracranial abnormality.    Prior level of function: Independent with community mobility without device  Occupational demands: Disabled  Hobbies: None stated  Red flags (bowel/bladder  changes, saddle paresthesia, personal history of cancer, h/o spinal tumors, h/o compression fx, h/o abdominal aneurysm, abdominal pain, chills/fever, night sweats, nausea, vomiting, unrelenting pain): Negative  Precautions: None  Weight Bearing Restrictions: No  Living Environment Lives with: lives alone; pt has medical pull cords in house to get emergency care prn.  Lives in: Apartment/assisted living complex Has following equipment at home: None   Patient Goals: Improve dragging LLE and weakness/stiffness of her upper limb     OBJECTIVE:   Patient Surveys  FOTO: 44, predicted improvement to 59 ABC:   Cognition Patient is oriented to person, place, and time.  Recent memory is intact.  Remote memory is intact.  Attention span and concentration are intact.  Expressive speech is intact.  Patient's fund of knowledge is within normal limits for educational level.     Gross Musculoskeletal Assessment Tremor: None Bulk: Normal Tone: Normal   GAIT: Distance walked: 40 ft Assistive device utilized: None Level of assistance: SBA Comments: Abductor lurch with R>L stance phase, decreased step length, dec stance time on LLE,    Posture: Self-selected sacral sitting and  thoracolumbar kyphosis in static sitting/standing position    AROM  AROM (Normal range in degrees) AROM  07/27/2023  Lumbar   Flexion (65)   Extension (30)   Right lateral flexion (25)   Left lateral flexion (25)   Right rotation (30)   Left rotation (30)       Hip Right Left  Flexion (125)    Extension (15)    Abduction (40)    Adduction     Internal Rotation (45)    External Rotation (45)        Knee    Flexion (135)    Extension (0)        Ankle    Dorsiflexion (20)    Plantarflexion (50)    Inversion (35)    Eversion (15    (* = pain; Blank rows = not tested)   LE MMT:  MMT (out of 5) Right 07/27/2023 Left 07/27/2023  Hip flexion 4 3+  Hip extension    Hip abduction (seated) 4 4-  Hip adduction    Hip internal rotation    Hip external rotation    Knee flexion 4 4-  Knee extension 4 4-  Ankle dorsiflexion 4 4-  Ankle plantarflexion    Ankle inversion    Ankle eversion    (* = pain; Blank rows = not tested)   Sensation Sensory loss along fibular head and medial malleolus bilat. Proprioception, and hot/cold testing deferred on this date.   Reflexes Deferred   Cranial Nerves Visual acuity and visual fields are intact  Convergence insuffiencey Extraocular muscles are intact  Facial sensation is intact bilaterally  Facial strength is intact bilaterally  Hearing is normal as tested by gross conversation Palate elevates midline, normal phonation  Shoulder shrug strength is intact  Tongue protrudes midline   Coordination/Cerebellar Finger to Nose: WNL Heel to Shin: WNL Rapid alternating movements: WNL Finger Opposition: WNL Pronator Drift: Negative   FUNCTIONAL OUTCOME MEASURES   Results Comments  BERG 39/56   DGI 19/24   TUG 28.5 seconds Fall risk, in need of intervention  5TSTS Unable to obtain baseline, difficulty with sit to stand   (Blank rows = not tested)   Clinical Test of Sensory Interaction for Balance     (CTSIB):  CONDITION TIME STRATEGY SWAY  Eyes open, firm surface 30 seconds Ankle   Eyes closed, firm  surface 30 seconds ankle   Eyes open, foam surface 30 seconds Ankle, hip Increased c foam  Eyes closed, foam surface 9 seconds Ankle, hip       Increased c foam         TODAY'S TREATMENT    SUBJECTIVE STATEMENT:   Pt reports compliance with HEP. She reports no major updates today. She reports no recent falls/near-falls. Patient reports no new complaints this AM.     Therapeutic Exercise - improved strength as needed to improve performance of CKC activities/functional movements and as needed for power production to prevent fall during episode of large postural perturbation   NuStep; x 5 minutes, Level 1 for 5 min;  -using LE only and resting back against backrest  Standing march at table, UE support on edge of table; 2x10 alternating R/L with 5-lb ankle weights    PATIENT EDUCATION: Discussed continued HEP and expectations with therapy.    *not today* Standing heel raise/toe raise; 2x10 alternating  -Min verbal cueing  and demo for technique Minisquat; x20  -heavy guarding and cueing for technique  -significant cueing to remain in small 30-45 deg ROM; pt wishes to squat lower    Neuromuscular Re-education - for improved sensory integration, static and dynamic postural control, equilibrium and non-equilibrium coordination as needed for negotiating home and community environment and stepping over obstacles  Dynamic march on blue agility ladder; 3x D/B length of ladder  -PT CGA  Single hurdle step over 10-lb cuff ankle weight; 1x15 with each LE  Toe tapping; 6-inch step; 2x10 alternating  Forward step-over 4 consecutive 1x4 boards, in // bars; 3x D/B  -heavy cueing for sequencing and foot placement prior to step-over  -repeated demo for technique   *next visit* Standing FT on Airex; x 1 min without notable LOB Standing semitandem on Airex; multiple attempts with  5-10 sec intervals obtained on each side     PATIENT EDUCATION:  Education details: see above for patient education details Person educated: Patient Education method: Explanation, Demonstration, and Handouts Education comprehension: verbalized understanding and returned demonstration   HOME EXERCISE PROGRAM: Access Code: [email or web address] URL: [name] Date: 08/17/2023 Prepared by: Consuela Mimes  Exercises - Seated March  - 2 x daily - 7 x weekly - 2 sets - 10 reps - Seated Long Arc Quad  - 2 x daily - 7 x weekly - 2 sets - 10 reps - Sit to Stand with Armchair  - 2 x daily - 7 x weekly - 2-3 sets - 5-6 reps - Standing March with Counter Support  - 2 x daily - 7 x weekly - 2 sets - 10 reps - Heel Toe Raises with Counter Support  - 2 x daily - 7 x weekly - 2 sets - 10 reps    ASSESSMENT:  CLINICAL IMPRESSION: Patient has improved in regard to activity tolerance in PT. Pt is significantly deconditioned and has comorbid back pain and marked LLE weakness limiting her current level of function. Pt requires heavy repeated cueing and therapist demonstration to accomplish hurdle step-over and consecutive step-overs with pt having difficulty following technique and sequencing of these drills. Pt still has marked LE weakness and gait changes necessitating further PT intervention. Patient will benefit from skilled PT to address above impairments and improve overall function.  REHAB POTENTIAL: Fair due to significant psych component and L-sided sensory/motor changes  CLINICAL DECISION MAKING: Unstable/unpredictable  EVALUATION COMPLEXITY: High   GOALS: Goals reviewed with patient? Yes  SHORT TERM GOALS: Target  date: 08/17/2023  Pt will be independent with HEP in order to improve strength and balance in order to decrease fall risk and improve function at home. Baseline: 07/27/23: Baseline HEP initiated.  Goal status: INITIAL  Pt will perform independent sit to  stand with no upper extremity support as needed for home-level and community-level mobility and indicative of improved functional LE strength  Baseline: 07/27/23: Difficulty with sit to stand and heavy reliance on pushing from armrests.  Goal status: INITIAL   LONG TERM GOALS: Target date: 09/21/2023  Pt will increase FOTO to at least 52 to demonstrate significant improvement in function at home related to balance  Baseline: 07/27/23: 44 Goal status: INITIAL  2.  Pt will improve BERG by at least 3 points in order to demonstrate clinically significant improvement in balance.   Baseline: 07/27/23: To be performed on visit # 2.    08/10/23: 39/56 Goal status: INITIAL  3.  Pt will improve ABC by at least 13% in order to demonstrate clinically significant improvement in balance confidence.      Baseline: 07/27/23: To be completed on visit # 2.   Goal status: INITIAL  4. Pt will improve DGI by at least 3 points in order to demonstrate clinically significant improvement in balance and decreased risk for falls.     Baseline: 07/27/23: To be performed on visit # 2.    08/12/23: 19/24 Goal status: INITIAL  5. Pt will decrease TUG to below 14 seconds/decrease in order to demonstrate decreased fall risk.  Baseline: 07/27/23: 28.5 sec Goal status: INITIAL    PLAN: PT FREQUENCY: 2x/week  PT DURATION: 12 weeks  PLANNED INTERVENTIONS: Therapeutic exercises, Therapeutic activity, Neuromuscular re-education, Balance training, Gait training, Patient/Family education, Joint manipulation, Joint mobilization, Canalith repositioning, Aquatic Therapy, Dry Needling, Cognitive remediation, Electrical stimulation, Spinal manipulation, Spinal mobilization, Cryotherapy, Moist heat, Traction, Ultrasound, Ionotophoresis 4mg /ml Dexamethasone, and Manual therapy  PLAN FOR NEXT SESSION: Continue with low-level LE strengthening, postural control drills, progressive weight shifting and stepping drills as tolerated. Update  HEP for LE strength and balance with successive visits.     Consuela Mimes, PT, DPT #[id number]  Kelly [name] 09/09/2023, 7:30 AM

## 2023-09-14 ENCOUNTER — Encounter: Payer: 59 | Admitting: Physical Therapy

## 2023-09-14 ENCOUNTER — Ambulatory Visit: Payer: 59 | Admitting: Physical Therapy

## 2023-09-16 ENCOUNTER — Ambulatory Visit: Payer: 59 | Admitting: Physical Therapy

## 2023-09-16 ENCOUNTER — Encounter: Payer: 59 | Admitting: Physical Therapy

## 2023-09-21 ENCOUNTER — Ambulatory Visit: Payer: 59 | Admitting: Physical Therapy

## 2023-09-23 ENCOUNTER — Ambulatory Visit: Payer: 59 | Admitting: Physical Therapy

## 2023-09-28 ENCOUNTER — Ambulatory Visit: Payer: 59 | Admitting: Physical Therapy

## 2023-09-30 ENCOUNTER — Encounter: Payer: 59 | Admitting: Physical Therapy

## 2023-10-05 ENCOUNTER — Ambulatory Visit: Payer: 59 | Attending: Neurology | Admitting: Physical Therapy

## 2023-10-05 DIAGNOSIS — M6281 Muscle weakness (generalized): Secondary | ICD-10-CM | POA: Diagnosis not present

## 2023-10-05 DIAGNOSIS — R2689 Other abnormalities of gait and mobility: Secondary | ICD-10-CM | POA: Diagnosis not present

## 2023-10-05 DIAGNOSIS — R262 Difficulty in walking, not elsewhere classified: Secondary | ICD-10-CM | POA: Insufficient documentation

## 2023-10-05 NOTE — Therapy (Signed)
OUTPATIENT PHYSICAL THERAPY TREATMENT/GOAL UPDATE AND RE-ASSESSMENT/RE-CERTIFICATION   Patient Name: Kelly [name] MRN: [id number] DOB:[birth year], 57 y.o., female Today's Date: 10/05/2023    PT End of Session - 10/05/23 1126     Visit Number 6    Number of Visits 17    Date for PT Re-Evaluation 09/23/23    Authorization Type [employer] Medicare/Medicaid 2024    PT Start Time 1126    PT Stop Time 1200    PT Time Calculation (min) 34 min    Activity Tolerance Patient limited by pain    Behavior During Therapy Anxious   Distressed            Past Medical History:  Diagnosis Date   Acid reflux    Anxiety    Hypercholesteremia    Myocardial infarction (HCC)    Schizophrenia (HCC)    Seizures (HCC)    Past Surgical History:  Procedure Laterality Date   CARDIAC CATHETERIZATION     CESAREAN SECTION     DILATION AND CURETTAGE OF UTERUS     FOOT SURGERY     TONSILLECTOMY     Patient Active Problem List   Diagnosis Date Noted   Left-sided weakness 07/11/2018   Schizoaffective disorder (HCC) 04/08/2016   Acute anxiety 04/08/2016    PCP: Morene Crocker, MD  REFERRING PROVIDER: [facility]  REFERRING DIAGNOSIS:  R26.89 (ICD-10-CM) - Imbalance     THERAPY DIAG: Imbalance  Difficulty in walking, not elsewhere classified  Muscle weakness (generalized)  ONSET DATE: 05/26/23 (worsening imbalance in last 2 months)  FOLLOW UP APPT WITH PROVIDER: Yes ; 08/26/23   RATIONALE FOR EVALUATION AND TREATMENT: Rehabilitation  SUBJECTIVE:                                                                                                                                                                                          Pertinent History  Pt is a 57 year old female referred for imbalance with Hx of seizure disorder, schizophrenia, anxiety, MI. Pt has worsening imbalance; difficulty with going up stairs. Difficulty picking up feet when walking. Pt reports episodes of her  legs giving out. Pt reports L lower extremity and L upper extremity discomfort; pt has L-sided weakness. Patient reports no Hx of TIA/CVA. Patient reports episode of lower extremities buckling when standing on her porch. Patient reports some sensory change/numbness affecting her L side; she reports paresthesias affecting L>R foot.  Pt does have notable seizure disorder.    Pain: Yes; L knee, L shoulder Numbness/Tingling: Yes; L>R foot; L-sided sensory loss in arm/leg  Focal Weakness: Yes; weakness L>RLE in thighs/legs Recent changes in overall health/medication:  No recent changes in medication  Prior history of physical therapy for balance:  Yes; previous episode of PT for balance  Falls: Has patient fallen in last 6 months? Yes, Number of falls: 5; some back pain after having fall  Directional pattern for falls: Yes; falling forward with BLE buckling  Imaging: Yes ;  03/09/23 Head CT  1. No acute intracranial abnormality.    Prior level of function: Independent with community mobility without device  Occupational demands: Disabled  Hobbies: None stated  Red flags (bowel/bladder changes, saddle paresthesia, personal history of cancer, h/o spinal tumors, h/o compression fx, h/o abdominal aneurysm, abdominal pain, chills/fever, night sweats, nausea, vomiting, unrelenting pain): Negative  Precautions: None  Weight Bearing Restrictions: No  Living Environment Lives with: lives alone; pt has medical pull cords in house to get emergency care prn.  Lives in: Apartment/assisted living complex Has following equipment at home: None   Patient Goals: Improve dragging LLE and weakness/stiffness of her upper limb     OBJECTIVE:   Patient Surveys  FOTO: 44, predicted improvement to 24 ABC:   Cognition Patient is oriented to person, place, and time.  Recent memory is intact.  Remote memory is intact.  Attention span and concentration are intact.  Expressive speech is intact.   Patient's fund of knowledge is within normal limits for educational level.     Gross Musculoskeletal Assessment Tremor: None Bulk: Normal Tone: Normal   GAIT: Distance walked: 40 ft Assistive device utilized: None Level of assistance: SBA Comments: Abductor lurch with R>L stance phase, decreased step length, dec stance time on LLE  *10/05/23: Heavy circumduction to clear each LE and lateral trunk flexon with contralateral swing phase, decreased single-limb support time bilaterally, decreased step length and step cadence   Posture: Self-selected sacral sitting and thoracolumbar kyphosis in static sitting/standing position    AROM  AROM (Normal range in degrees) AROM  07/27/2023  Lumbar   Flexion (65)   Extension (30)   Right lateral flexion (25)   Left lateral flexion (25)   Right rotation (30)   Left rotation (30)       Hip Right Left  Flexion (125)    Extension (15)    Abduction (40)    Adduction     Internal Rotation (45)    External Rotation (45)        Knee    Flexion (135)    Extension (0)        Ankle    Dorsiflexion (20)    Plantarflexion (50)    Inversion (35)    Eversion (15    (* = pain; Blank rows = not tested)   LE MMT:  MMT (out of 5) Right 07/27/2023 Left 07/27/2023 Right 10/05/23 Left 10/05/23  Hip flexion 4 3+ 4 4-  Hip extension      Hip abduction (seated) 4 4- 4 4  Hip adduction      Hip internal rotation      Hip external rotation      Knee flexion 4 4- 4+ 4-  Knee extension 4 4- 4+ 4  Ankle dorsiflexion 4 4- 4- 4-  Ankle plantarflexion      Ankle inversion      Ankle eversion      (* = pain; Blank rows = not tested)   Sensation Sensory loss along fibular head and medial malleolus bilat. Proprioception, and hot/cold testing deferred on this date.   Reflexes Deferred   Cranial Nerves Visual acuity and  visual fields are intact  Convergence insuffiencey Extraocular muscles are intact  Facial sensation is intact  bilaterally  Facial strength is intact bilaterally  Hearing is normal as tested by gross conversation Palate elevates midline, normal phonation  Shoulder shrug strength is intact  Tongue protrudes midline   Coordination/Cerebellar Finger to Nose: WNL Heel to Shin: WNL Rapid alternating movements: WNL Finger Opposition: WNL Pronator Drift: Negative   FUNCTIONAL OUTCOME MEASURES   Results Comments  BERG 39/56   DGI 19/24   TUG 28.5 seconds Fall risk, in need of intervention  5TSTS Unable to obtain baseline, difficulty with sit to stand   (Blank rows = not tested)   Clinical Test of Sensory Interaction for Balance    (CTSIB):  CONDITION TIME STRATEGY SWAY  Eyes open, firm surface 30 seconds Ankle   Eyes closed, firm surface 30 seconds ankle   Eyes open, foam surface 30 seconds Ankle, hip Increased c foam  Eyes closed, foam surface 9 seconds Ankle, hip       Increased c foam    Performed on 10/05/23:  Eyes open, foam surface:  Eyes closed, foam surface:      TODAY'S TREATMENT    SUBJECTIVE STATEMENT:   Pt arrives at 11:26 this AM. She reports having fall when going to medical transportation [place] leaving her home. Patient reports that her condition has gotten worse with her time out of PT over this previous month. Patient reports being bed bound over last 2 weeks and having difficulty getting around without falling. Patient has had to miss PT over last month given significant challenges. Pt feels her LLE is collapsing often when attempting to get up and walk.     Therapeutic Exercise - improved strength as needed to improve performance of CKC activities/functional movements and as needed for power production to prevent fall during episode of large postural perturbation   NuStep; x 5 minutes, Level 1 for 5 min;  -using LE only and resting back against backrest   *Short-term goal update performed *FOTO update completed -Manual muscle testing performed   PATIENT  EDUCATION: Discussed resumption of HEP and appropriate use of assistive device (front-wheeled walker) to reduce fall risk given re-occurring falls in patient's facility. We discussed need for return to PT to address functional mobility deficits. Pt educated on detrimental effects of prolonged bed rest if not needed for medical reasons/physician's orders.     *next visit* Standing march at table, UE support on edge of table; 2x10 alternating R/L with 5-lb ankle weights   *not today* Standing heel raise/toe raise; 2x10 alternating  -Min verbal cueing  and demo for technique Minisquat; x20  -heavy guarding and cueing for technique  -significant cueing to remain in small 30-45 deg ROM; pt wishes to squat lower    Neuromuscular Re-education - for improved sensory integration, static and dynamic postural control, equilibrium and non-equilibrium coordination as needed for negotiating home and community environment and stepping over obstacles     *next visit* Standing FT on Airex; x 1 min without notable LOB Standing semitandem on Airex; multiple attempts with 5-10 sec intervals obtained on each side   *not today* Dynamic march on blue agility ladder; 3x D/B length of ladder  -PT CGA Single hurdle step over 10-lb cuff ankle weight; 1x15 with each LE Toe tapping; 6-inch step; 2x10 alternating Forward step-over 4 consecutive 1x4 boards, in // bars; 3x D/B  -heavy cueing for sequencing and foot placement prior to step-over  -repeated demo for technique  PATIENT EDUCATION:  Education details: see above for patient education details Person educated: Patient Education method: Explanation, Demonstration, and Handouts Education comprehension: verbalized understanding and returned demonstration   HOME EXERCISE PROGRAM: Access Code: [email or web address] URL: [email or web address] Date: 08/17/2023 Prepared by: Consuela Mimes  Exercises - Seated March  - 2 x daily - 7 x weekly - 2  sets - 10 reps - Seated Long Arc Quad  - 2 x daily - 7 x weekly - 2 sets - 10 reps - Sit to Stand with Armchair  - 2 x daily - 7 x weekly - 2-3 sets - 5-6 reps - Standing March with Counter Support  - 2 x daily - 7 x weekly - 2 sets - 10 reps - Heel Toe Raises with Counter Support  - 2 x daily - 7 x weekly - 2 sets - 10 reps    ASSESSMENT:  CLINICAL IMPRESSION: Patient fortunately has no reduction in FOTO and relatively stable MMTs in spite of patient being concerned with her condition over the last month. Pt reports significant difficulty with transfers and gait. She reports being largely bed bound over last 2 weeks and having limited compliance with HEP. She feels that her LLE hurt following exercise and feels that this hampered her compliance with exercise and participation with PT. Objective data gathering is limited today due to challenges with communication and responding to verbal cues/instructions given psych status. We recommended pt use walker in home at least until status improves given substantially impaired gait and ability to perform transferring. We discussed significant deficits that can result from prolonged bed rest and encouraged pt to remain active as able each day. Pt has remaining deficits in L>RLE strength, postural control, gait deficits, and movement control necessitating further PT intervention. Patient will benefit from skilled PT to address above impairments and improve overall function.  REHAB POTENTIAL: Fair due to significant psych component and L-sided sensory/motor changes  CLINICAL DECISION MAKING: Unstable/unpredictable  EVALUATION COMPLEXITY: High   GOALS: Goals reviewed with patient? Yes  SHORT TERM GOALS: Target date: 08/17/2023  Pt will be independent with HEP in order to improve strength and balance in order to decrease fall risk and improve function at home. Baseline: 07/27/23: Baseline HEP initiated.      10/05/23: Pt was bed bound several days and  had to miss multiple days of HEP Goal status: NOT MET   Pt will perform independent sit to stand with no upper extremity support as needed for home-level and community-level mobility and indicative of improved functional LE strength  Baseline: 07/27/23: Difficulty with sit to stand and heavy reliance on pushing from armrests.      10/05/23: Pt able to complete with multiple attempts, careful CGA/guarding.  Goal status: PARTIALLY MET    LONG TERM GOALS: Target date: 09/21/2023  Pt will increase FOTO to at least 52 to demonstrate significant improvement in function at home related to balance  Baseline: 07/27/23: 44.     10/05/23: 45/52 Goal status: NOT MET   2.  Pt will improve BERG by at least 3 points in order to demonstrate clinically significant improvement in balance.   Baseline: 07/27/23: To be performed on visit # 2.    08/10/23: 39/56     10/05/23: Update next visit.  Goal status: INITIAL  3.  Pt will improve ABC by at least 13% in order to demonstrate clinically significant improvement in balance confidence.      Baseline: 07/27/23: To be completed on visit # 2.  10/05/23: Update next visit.  Goal status: INITIAL  4. Pt will improve DGI by at least 3 points in order to demonstrate clinically significant improvement in balance and decreased risk for falls.     Baseline: 07/27/23: To be performed on visit # 2.    08/12/23: 19/24     10/05/23: Update next visit.  Goal status: INITIAL  5. Pt will decrease TUG to below 14 seconds/decrease in order to demonstrate decreased fall risk.  Baseline: 07/27/23: 28.5 sec     10/05/23: Update next visit.  Goal status: INITIAL    PLAN: PT FREQUENCY: 2x/week  PT DURATION: 12 weeks  PLANNED INTERVENTIONS: Therapeutic exercises, Therapeutic activity, Neuromuscular re-education, Balance training, Gait training, Patient/Family education, Electrical stimulation  PLAN FOR NEXT SESSION: Continue with low-level LE strengthening, postural control  drills, progressive weight shifting and stepping drills as tolerated. Update HEP for LE strength and balance with successive visits.     Consuela Mimes, PT, DPT #[id number]  Gertie Exon 10/05/2023, 11:27 AM

## 2023-10-11 ENCOUNTER — Encounter: Payer: Self-pay | Admitting: Physical Therapy

## 2023-10-12 ENCOUNTER — Ambulatory Visit: Payer: 59 | Admitting: Physical Therapy

## 2023-10-19 ENCOUNTER — Ambulatory Visit: Payer: 59 | Admitting: Physical Therapy

## 2023-10-21 ENCOUNTER — Ambulatory Visit: Payer: 59 | Attending: Neurology | Admitting: Physical Therapy

## 2023-10-21 DIAGNOSIS — M6281 Muscle weakness (generalized): Secondary | ICD-10-CM | POA: Diagnosis not present

## 2023-10-21 DIAGNOSIS — R262 Difficulty in walking, not elsewhere classified: Secondary | ICD-10-CM | POA: Diagnosis not present

## 2023-10-21 DIAGNOSIS — R2689 Other abnormalities of gait and mobility: Secondary | ICD-10-CM | POA: Insufficient documentation

## 2023-10-21 NOTE — Therapy (Signed)
OUTPATIENT PHYSICAL THERAPY TREATMENT/OUTCOME MEASURE UPDATE   Patient Name: Kelly [name] MRN: [id number] DOB:[birth year], 57 y.o., female Today's Date: 10/21/2023    PT End of Session - 10/21/23 1256     Visit Number 7    Number of Visits 17    Date for PT Re-Evaluation 09/23/23    Authorization Type [employer] Medicare/Medicaid [id number]    PT Start Time 1256    PT Stop Time 1345    PT Time Calculation (min) 49 min    Activity Tolerance Patient limited by pain    Behavior During Therapy Anxious   Distressed             Past Medical History:  Diagnosis Date   Acid reflux    Anxiety    Hypercholesteremia    Myocardial infarction (HCC)    Schizophrenia (HCC)    Seizures (HCC)    Past Surgical History:  Procedure Laterality Date   CARDIAC CATHETERIZATION     CESAREAN SECTION     DILATION AND CURETTAGE OF UTERUS     FOOT SURGERY     TONSILLECTOMY     Patient Active Problem List   Diagnosis Date Noted   Left-sided weakness 07/11/2018   Schizoaffective disorder (HCC) 04/08/2016   Acute anxiety 04/08/2016    PCP: Morene Crocker, MD  REFERRING PROVIDER: [facility]  REFERRING DIAGNOSIS:  R26.89 (ICD-10-CM) - Imbalance     THERAPY DIAG: Imbalance  Difficulty in walking, not elsewhere classified  Muscle weakness (generalized)  ONSET DATE: 05/26/23 (worsening imbalance in last 2 months)  FOLLOW UP APPT WITH PROVIDER: Yes ; 08/26/23   RATIONALE FOR EVALUATION AND TREATMENT: Rehabilitation  SUBJECTIVE:                                                                                                                                                                                          Pertinent History  Pt is a 57 year old female referred for imbalance with Hx of seizure disorder, schizophrenia, anxiety, MI. Pt has worsening imbalance; difficulty with going up stairs. Difficulty picking up feet when walking. Pt reports episodes of her legs giving out. Pt  reports L lower extremity and L upper extremity discomfort; pt has L-sided weakness. Patient reports no Hx of TIA/CVA. Patient reports episode of lower extremities buckling when standing on her porch. Patient reports some sensory change/numbness affecting her L side; she reports paresthesias affecting L>R foot.  Pt does have notable seizure disorder.    Pain: Yes; L knee, L shoulder Numbness/Tingling: Yes; L>R foot; L-sided sensory loss in arm/leg  Focal Weakness: Yes; weakness L>RLE in thighs/legs Recent changes in overall health/medication:  No recent changes in medication  Prior history of physical therapy for balance:  Yes; previous episode of PT for balance  Falls: Has patient fallen in last 6 months? Yes, Number of falls: 5; some back pain after having fall  Directional pattern for falls: Yes; falling forward with BLE buckling  Imaging: Yes ;  03/09/23 Head CT  1. No acute intracranial abnormality.    Prior level of function: Independent with community mobility without device  Occupational demands: Disabled  Hobbies: None stated  Red flags (bowel/bladder changes, saddle paresthesia, personal history of cancer, h/o spinal tumors, h/o compression fx, h/o abdominal aneurysm, abdominal pain, chills/fever, night sweats, nausea, vomiting, unrelenting pain): Negative  Precautions: None  Weight Bearing Restrictions: No  Living Environment Lives with: lives alone; pt has medical pull cords in house to get emergency care prn.  Lives in: Apartment/assisted living complex Has following equipment at home: None   Patient Goals: Improve dragging LLE and weakness/stiffness of her upper limb     OBJECTIVE:   Patient Surveys  FOTO: 44, predicted improvement to 53 ABC: 35.6%   Cognition Patient is oriented to person, place, and time.  Recent memory is intact.  Remote memory is intact.  Attention span and concentration are intact.  Expressive speech is intact.  Patient's fund of  knowledge is within normal limits for educational level.     Gross Musculoskeletal Assessment Tremor: None Bulk: Normal Tone: Normal   GAIT: Distance walked: 40 ft Assistive device utilized: None Level of assistance: SBA Comments: Abductor lurch with R>L stance phase, decreased step length, dec stance time on LLE  *10/05/23: Heavy circumduction to clear each LE and lateral trunk flexon with contralateral swing phase, decreased single-limb support time bilaterally, decreased step length and step cadence   Posture: Self-selected sacral sitting and thoracolumbar kyphosis in static sitting/standing position    AROM  AROM (Normal range in degrees) AROM  07/27/2023  Lumbar   Flexion (65)   Extension (30)   Right lateral flexion (25)   Left lateral flexion (25)   Right rotation (30)   Left rotation (30)       Hip Right Left  Flexion (125)    Extension (15)    Abduction (40)    Adduction     Internal Rotation (45)    External Rotation (45)        Knee    Flexion (135)    Extension (0)        Ankle    Dorsiflexion (20)    Plantarflexion (50)    Inversion (35)    Eversion (15    (* = pain; Blank rows = not tested)   LE MMT:  MMT (out of 5) Right 07/27/2023 Left 07/27/2023 Right 10/05/23 Left 10/05/23  Hip flexion 4 3+ 4 4-  Hip extension      Hip abduction (seated) 4 4- 4 4  Hip adduction      Hip internal rotation      Hip external rotation      Knee flexion 4 4- 4+ 4-  Knee extension 4 4- 4+ 4  Ankle dorsiflexion 4 4- 4- 4-  Ankle plantarflexion      Ankle inversion      Ankle eversion      (* = pain; Blank rows = not tested)   Sensation Sensory loss along fibular head and medial malleolus bilat. Proprioception, and hot/cold testing deferred on this date.   Reflexes Deferred   Cranial Nerves Visual acuity  and visual fields are intact  Convergence insuffiencey Extraocular muscles are intact  Facial sensation is intact bilaterally  Facial  strength is intact bilaterally  Hearing is normal as tested by gross conversation Palate elevates midline, normal phonation  Shoulder shrug strength is intact  Tongue protrudes midline   Coordination/Cerebellar Finger to Nose: WNL Heel to Shin: WNL Rapid alternating movements: WNL Finger Opposition: WNL Pronator Drift: Negative   FUNCTIONAL OUTCOME MEASURES   Results Comments  BERG 39/56   DGI 19/24   TUG 28.5 seconds Fall risk, in need of intervention  5TSTS Unable to obtain baseline, difficulty with sit to stand   (Blank rows = not tested)   Clinical Test of Sensory Interaction for Balance    (CTSIB):  CONDITION TIME STRATEGY SWAY  Eyes open, firm surface 30 seconds Ankle   Eyes closed, firm surface 30 seconds ankle   Eyes open, foam surface 30 seconds Ankle, hip Increased c foam  Eyes closed, foam surface 9 seconds Ankle, hip       Increased c foam        TODAY'S TREATMENT    SUBJECTIVE STATEMENT:   Pt reports using her cane more when getting around her home/facility. She arrives with quad cane. She states that she obtained quad cane and Hurrycane through insurance coverage. She reports concern for L knee and leg pain limiting her ability to complete significant activity.     Therapeutic Exercise - improved strength as needed to improve performance of CKC activities/functional movements and as needed for power production to prevent fall during episode of large postural perturbation   NuStep; x 5 minutes, Level 2 for 5 min;  -using upper/lower limbs and resting back against backrest   *Completion of TUG and ABC Scale   PATIENT EDUCATION: Discussed proper use of cane and recommendation for single-point cane or Hurrycane versus quad cane.     *next visit* Standing march at table, UE support on edge of table; 2x10 alternating R/L with 5-lb ankle weights    *not today* Standing heel raise/toe raise; 2x10 alternating  -Min verbal cueing  and demo for  technique Minisquat; x20  -heavy guarding and cueing for technique  -significant cueing to remain in small 30-45 deg ROM; pt wishes to squat lower    Neuromuscular Re-education - for improved sensory integration, static and dynamic postural control, equilibrium and non-equilibrium coordination as needed for negotiating home and community environment and stepping over obstacles  *Performance of BERG  -reviewed results with patient   *next visit* Standing FT on Airex; x 1 min without notable LOB Standing semitandem on Airex; multiple attempts with 5-10 sec intervals obtained on each side    [facility] PT Assessment - 10/21/23 0001       Berg Balance Test   Sit to Stand Able to stand  independently using hands    Standing Unsupported Able to stand 2 minutes with supervision    Sitting with Back Unsupported but Feet Supported on Floor or Stool Able to sit safely and securely 2 minutes    Stand to Sit Sits safely with minimal use of hands    Transfers Able to transfer safely, minor use of hands    Standing Unsupported with Eyes Closed Able to stand 10 seconds safely    Standing Unsupported with Feet Together Able to place feet together independently but unable to hold for 30 seconds    From Standing, Reach Forward with Outstretched Arm Can reach forward >12 cm safely (5")  From Standing Position, Pick up Object from Floor Able to pick up shoe, needs supervision    From Standing Position, Turn to Look Behind Over each Shoulder Looks behind from both sides and weight shifts well    Turn 360 Degrees Needs close supervision or verbal cueing    Standing Unsupported, Alternately Place Feet on Step/Stool Needs assistance to keep from falling or unable to try    Standing Unsupported, One Foot in Front Able to plae foot ahead of the other independently and hold 30 seconds    Standing on One Leg Unable to try or needs assist to prevent fall    Total Score 38           *not today* Dynamic  march on blue agility ladder; 3x D/B length of ladder  -PT CGA Single hurdle step over 10-lb cuff ankle weight; 1x15 with each LE Toe tapping; 6-inch step; 2x10 alternating Forward step-over 4 consecutive 1x4 boards, in // bars; 3x D/B  -heavy cueing for sequencing and foot placement prior to step-over  -repeated demo for technique    PATIENT EDUCATION:  Education details: see above for patient education details Person educated: Patient Education method: Explanation, Demonstration, and Handouts Education comprehension: verbalized understanding and returned demonstration   HOME EXERCISE PROGRAM: Access Code: [email or web address] URL: [email or web address] Date: 08/17/2023 Prepared by: Consuela Mimes  Exercises - Seated March  - 2 x daily - 7 x weekly - 2 sets - 10 reps - Seated Long Arc Quad  - 2 x daily - 7 x weekly - 2 sets - 10 reps - Sit to Stand with Armchair  - 2 x daily - 7 x weekly - 2-3 sets - 5-6 reps - Standing March with Counter Support  - 2 x daily - 7 x weekly - 2 sets - 10 reps - Heel Toe Raises with Counter Support  - 2 x daily - 7 x weekly - 2 sets - 10 reps    ASSESSMENT:  CLINICAL IMPRESSION: Patient reports pain with extending L knee and pain with load-bearing into L side without any Hx of neuropathy or sciatic-type symptoms. Pt's presentation raises concern for musculoskeletal contribution to her current L lower limb pain with context of significant psych involvement that can likely affect pain experience. We are unable to complete DGI today due to significant time with completion of ABC Scale and BERG due to repeated reports of LLE weakness and pain and difficulty with following verbal commands. Psych involvement does seem to be significantly limiting participation in active therapy. We discussed today potential referral to orthopedics to discuss MSK contribution to current pain. BERG has not markedly changed in spite of patient being largely bed bound and  sedentary with limited HEP participation. TUG has notably improved with repeat trials. Pt has remaining deficits in L>RLE strength, postural control, gait deficits, and movement control necessitating further PT intervention. Patient will benefit from skilled PT to address above impairments and improve overall function.  REHAB POTENTIAL: Fair due to significant psych component and L-sided sensory/motor changes  CLINICAL DECISION MAKING: Unstable/unpredictable  EVALUATION COMPLEXITY: High   GOALS: Goals reviewed with patient? Yes  SHORT TERM GOALS: Target date: 08/17/2023  Pt will be independent with HEP in order to improve strength and balance in order to decrease fall risk and improve function at home. Baseline: 07/27/23: Baseline HEP initiated.      10/05/23: Pt was bed bound several days and had to miss multiple days of HEP Goal status: NOT MET  Pt will perform independent sit to stand with no upper extremity support as needed for home-level and community-level mobility and indicative of improved functional LE strength  Baseline: 07/27/23: Difficulty with sit to stand and heavy reliance on pushing from armrests.      10/05/23: Pt able to complete with multiple attempts, careful CGA/guarding.  Goal status: PARTIALLY MET    LONG TERM GOALS: Target date: 09/21/2023  Pt will increase FOTO to at least 52 to demonstrate significant improvement in function at home related to balance  Baseline: 07/27/23: 44.     10/05/23: 45/52 Goal status: NOT MET   2.  Pt will improve BERG by at least 3 points in order to demonstrate clinically significant improvement in balance.   Baseline: 07/27/23: To be performed on visit # 2.    08/10/23: 39/56     10/05/23: Update next visit.       10/21/23: 38/56 Goal status: NOT MET  3.  Pt will improve ABC by at least 13% in order to demonstrate clinically significant improvement in balance confidence.      Baseline: 07/27/23: To be completed on visit # 2.        10/05/23: Update next visit.      10/21/23: 35.6% Goal status: INITIAL  4. Pt will improve DGI by at least 3 points in order to demonstrate clinically significant improvement in balance and decreased risk for falls.     Baseline: 07/27/23: To be performed on visit # 2.    08/12/23: 19/24     10/05/23: Update next visit.    10/21/23: Unable to complete today, deferred.  Goal status: DEFERRED  5. Pt will decrease TUG to below 14 seconds/decrease in order to demonstrate decreased fall risk.  Baseline: 07/27/23: 28.5 sec     10/05/23: Update next visit.    10/21/23: 21.5 sec  Goal status: IN PROGRESS    PLAN: PT FREQUENCY: 2x/week  PT DURATION: 8 weeks  PLANNED INTERVENTIONS: Therapeutic exercises, Therapeutic activity, Neuromuscular re-education, Balance training, Gait training, Patient/Family education, Electrical stimulation  PLAN FOR NEXT SESSION: Continue with low-level LE strengthening, postural control drills, progressive weight shifting and stepping drills as tolerated. Update HEP for LE strength and balance with successive visits.     Consuela Mimes, PT, DPT #[id number]  Gertie Exon 10/21/2023, 1:45 PM

## 2023-10-25 ENCOUNTER — Encounter: Payer: Self-pay | Admitting: Physical Therapy

## 2023-10-26 ENCOUNTER — Encounter: Payer: Self-pay | Admitting: Physical Therapy

## 2023-10-26 ENCOUNTER — Ambulatory Visit: Payer: 59 | Admitting: Physical Therapy

## 2023-10-26 DIAGNOSIS — R2689 Other abnormalities of gait and mobility: Secondary | ICD-10-CM | POA: Diagnosis not present

## 2023-10-26 DIAGNOSIS — R262 Difficulty in walking, not elsewhere classified: Secondary | ICD-10-CM

## 2023-10-26 DIAGNOSIS — M6281 Muscle weakness (generalized): Secondary | ICD-10-CM

## 2023-10-26 NOTE — Therapy (Signed)
OUTPATIENT PHYSICAL THERAPY TREATMENT  Patient Name: Kelly [name] MRN: [id number] DOB:[birth year], 57 y.o., female Today's Date: 10/26/2023    PT End of Session - 10/26/23 1113     Visit Number 8    Number of Visits 17    Date for PT Re-Evaluation 09/23/23    Authorization Type [employer] Medicare/Medicaid [id number]    PT Start Time 1115    PT Stop Time 1159    PT Time Calculation (min) 44 min    Activity Tolerance Patient limited by pain    Behavior During Therapy Anxious   Distressed              Past Medical History:  Diagnosis Date   Acid reflux    Anxiety    Hypercholesteremia    Myocardial infarction (HCC)    Schizophrenia (HCC)    Seizures (HCC)    Past Surgical History:  Procedure Laterality Date   CARDIAC CATHETERIZATION     CESAREAN SECTION     DILATION AND CURETTAGE OF UTERUS     FOOT SURGERY     TONSILLECTOMY     Patient Active Problem List   Diagnosis Date Noted   Left-sided weakness 07/11/2018   Schizoaffective disorder (HCC) 04/08/2016   Acute anxiety 04/08/2016    PCP: Morene Crocker, MD  REFERRING PROVIDER: [facility]  REFERRING DIAGNOSIS:  R26.89 (ICD-10-CM) - Imbalance     THERAPY DIAG: Imbalance  Difficulty in walking, not elsewhere classified  Muscle weakness (generalized)  ONSET DATE: 05/26/23 (worsening imbalance in last 2 months)  FOLLOW UP APPT WITH PROVIDER: Yes ; 08/26/23   RATIONALE FOR EVALUATION AND TREATMENT: Rehabilitation  SUBJECTIVE:                                                                                                                                                                                          Pertinent History  Pt is a 57 year old female referred for imbalance with Hx of seizure disorder, schizophrenia, anxiety, MI. Pt has worsening imbalance; difficulty with going up stairs. Difficulty picking up feet when walking. Pt reports episodes of her legs giving out. Pt reports L lower extremity  and L upper extremity discomfort; pt has L-sided weakness. Patient reports no Hx of TIA/CVA. Patient reports episode of lower extremities buckling when standing on her porch. Patient reports some sensory change/numbness affecting her L side; she reports paresthesias affecting L>R foot.  Pt does have notable seizure disorder.    Pain: Yes; L knee, L shoulder Numbness/Tingling: Yes; L>R foot; L-sided sensory loss in arm/leg  Focal Weakness: Yes; weakness L>RLE in thighs/legs Recent changes in overall health/medication: No recent  changes in medication  Prior history of physical therapy for balance:  Yes; previous episode of PT for balance  Falls: Has patient fallen in last 6 months? Yes, Number of falls: 5; some back pain after having fall  Directional pattern for falls: Yes; falling forward with BLE buckling  Imaging: Yes ;  03/09/23 Head CT  1. No acute intracranial abnormality.    Prior level of function: Independent with community mobility without device  Occupational demands: Disabled  Hobbies: None stated  Red flags (bowel/bladder changes, saddle paresthesia, personal history of cancer, h/o spinal tumors, h/o compression fx, h/o abdominal aneurysm, abdominal pain, chills/fever, night sweats, nausea, vomiting, unrelenting pain): Negative  Precautions: None  Weight Bearing Restrictions: No  Living Environment Lives with: lives alone; pt has medical pull cords in house to get emergency care prn.  Lives in: Apartment/assisted living complex Has following equipment at home: None   Patient Goals: Improve dragging LLE and weakness/stiffness of her upper limb     OBJECTIVE:   Patient Surveys  FOTO: 44, predicted improvement to 82 ABC: 35.6%   Cognition Patient is oriented to person, place, and time.  Recent memory is intact.  Remote memory is intact.  Attention span and concentration are intact.  Expressive speech is intact.  Patient's fund of knowledge is within normal  limits for educational level.     Gross Musculoskeletal Assessment Tremor: None Bulk: Normal Tone: Normal   GAIT: Distance walked: 40 ft Assistive device utilized: None Level of assistance: SBA Comments: Abductor lurch with R>L stance phase, decreased step length, dec stance time on LLE  *10/05/23: Heavy circumduction to clear each LE and lateral trunk flexon with contralateral swing phase, decreased single-limb support time bilaterally, decreased step length and step cadence   Posture: Self-selected sacral sitting and thoracolumbar kyphosis in static sitting/standing position    AROM  AROM (Normal range in degrees) AROM  07/27/2023  Lumbar   Flexion (65)   Extension (30)   Right lateral flexion (25)   Left lateral flexion (25)   Right rotation (30)   Left rotation (30)       Hip Right Left  Flexion (125)    Extension (15)    Abduction (40)    Adduction     Internal Rotation (45)    External Rotation (45)        Knee    Flexion (135)    Extension (0)        Ankle    Dorsiflexion (20)    Plantarflexion (50)    Inversion (35)    Eversion (15    (* = pain; Blank rows = not tested)   LE MMT:  MMT (out of 5) Right 07/27/2023 Left 07/27/2023 Right 10/05/23 Left 10/05/23  Hip flexion 4 3+ 4 4-  Hip extension      Hip abduction (seated) 4 4- 4 4  Hip adduction      Hip internal rotation      Hip external rotation      Knee flexion 4 4- 4+ 4-  Knee extension 4 4- 4+ 4  Ankle dorsiflexion 4 4- 4- 4-  Ankle plantarflexion      Ankle inversion      Ankle eversion      (* = pain; Blank rows = not tested)   Sensation Sensory loss along fibular head and medial malleolus bilat. Proprioception, and hot/cold testing deferred on this date.   Reflexes Deferred   Cranial Nerves Visual acuity and visual  fields are intact  Convergence insuffiencey Extraocular muscles are intact  Facial sensation is intact bilaterally  Facial strength is intact  bilaterally  Hearing is normal as tested by gross conversation Palate elevates midline, normal phonation  Shoulder shrug strength is intact  Tongue protrudes midline   Coordination/Cerebellar Finger to Nose: WNL Heel to Shin: WNL Rapid alternating movements: WNL Finger Opposition: WNL Pronator Drift: Negative   FUNCTIONAL OUTCOME MEASURES   Results Comments  BERG 39/56   DGI 19/24   TUG 28.5 seconds Fall risk, in need of intervention  5TSTS Unable to obtain baseline, difficulty with sit to stand   (Blank rows = not tested)   Clinical Test of Sensory Interaction for Balance    (CTSIB):  CONDITION TIME STRATEGY SWAY  Eyes open, firm surface 30 seconds Ankle   Eyes closed, firm surface 30 seconds ankle   Eyes open, foam surface 30 seconds Ankle, hip Increased c foam  Eyes closed, foam surface 9 seconds Ankle, hip       Increased c foam        TODAY'S TREATMENT    SUBJECTIVE STATEMENT:   Pt reports doing well with use of Hurrycane versus quad cane. She reports feeling relatively well today and having no recent near-falls or falls. Pt reports feeling that her LLE can be irritated after completion of PT/exercise.      Neuromuscular Re-education - for improved sensory integration, static and dynamic postural control, equilibrium and non-equilibrium coordination as needed for negotiating home and community environment and stepping over obstacles  *Performance of  DGI  -reviewed results with patient  Standing semitandem on Airex; multiple attempts with 10-20 sec intervals obtained on each side     *not today* Standing FT on Airex; x 1 min without notable LOB Dynamic march on blue agility ladder; 3x D/B length of ladder  -PT CGA Single hurdle step over 10-lb cuff ankle weight; 1x15 with each LE Toe tapping; 6-inch step; 2x10 alternating Forward step-over 4 consecutive 1x4 boards, in // bars; 3x D/B  -heavy cueing for sequencing and foot placement prior to  step-over  -repeated demo for technique    Therapeutic Exercise - improved strength as needed to improve performance of CKC activities/functional movements and as needed for power production to prevent fall during episode of large postural perturbation   NuStep; x 5 minutes, Level 2 for 5 min;  -using upper/lower limbs and resting back against backrest  // bars, dynamic march; 5x D/B length of bars  Seated march; 4-lb ankle weights; 2x10 alternating R/L  Standing heel raise/toe raise; 2x10 alternating  -Min verbal cueing  and demo for technique   PATIENT EDUCATION: Discussed regression in DGI performance, relative stability with other goals including modest improvement with TUG. We discussed continued plan of care and expectations following exercise. We discussed phone call made yesterday to referring provider's office to consider outside referral for musculoskeletal LLE pain.     *not today* Standing march at table, UE support on edge of table; 2x10 alternating R/L with 5-lb ankle weights  Minisquat; x20  -heavy guarding and cueing for technique  -significant cueing to remain in small 30-45 deg ROM; pt wishes to squat lower     [facility] PT Assessment - 10/26/23 0001       Dynamic Gait Index   Level Surface Mild Impairment    Change in Gait Speed Moderate Impairment    Gait with Horizontal Head Turns Normal    Gait with Vertical Head Turns Normal  Gait and Pivot Turn Moderate Impairment    Step Over Obstacle Moderate Impairment    Step Around Obstacles Normal    Steps Moderate Impairment    Total Score 15              PATIENT EDUCATION:  Education details: see above for patient education details Person educated: Patient Education method: Explanation, Demonstration, and Handouts Education comprehension: verbalized understanding and returned demonstration   HOME EXERCISE PROGRAM: Access Code: [email or web address] URL: [email or web address] Date:  08/17/2023 Prepared by: Consuela Mimes  Exercises - Seated March  - 2 x daily - 7 x weekly - 2 sets - 10 reps - Seated Long Arc Quad  - 2 x daily - 7 x weekly - 2 sets - 10 reps - Sit to Stand with Armchair  - 2 x daily - 7 x weekly - 2-3 sets - 5-6 reps - Standing March with Counter Support  - 2 x daily - 7 x weekly - 2 sets - 10 reps - Heel Toe Raises with Counter Support  - 2 x daily - 7 x weekly - 2 sets - 10 reps    ASSESSMENT:  CLINICAL IMPRESSION: Patient unfortunately has decreased DGI score compared to baseline in September; this is largely affected by worsening gait pattern, deconditioning with time during which pt was bed bound and hiatus from PT between 10/3 and 11/19. Pt feels that she is doing relatively well at baseline; however, she is significantly deconditioned and may also be limited by L lower limb neuropathic pain. I personally called her referring office about potential for orthopedic referral given consistent c/o anterior knee pain with weightbearing activity. Pt is following up with Dr. Daisy Blossom office tomorrow. Pt still needs significant work on strengthening and conditioning as well as L lower limb stability. Her activity tolerance is significantly diminished with pt being bed bound for ample time between October and November. Pt has remaining deficits in L>RLE strength, postural control, gait deficits, and movement control necessitating further PT intervention. Patient will benefit from skilled PT to address above impairments and improve overall function.  REHAB POTENTIAL: Fair due to significant psych component and L-sided sensory/motor changes  CLINICAL DECISION MAKING: Unstable/unpredictable  EVALUATION COMPLEXITY: High   GOALS: Goals reviewed with patient? Yes  SHORT TERM GOALS: Target date: 08/17/2023  Pt will be independent with HEP in order to improve strength and balance in order to decrease fall risk and improve function at home. Baseline: 07/27/23:  Baseline HEP initiated.      10/05/23: Pt was bed bound several days and had to miss multiple days of HEP Goal status: NOT MET   Pt will perform independent sit to stand with no upper extremity support as needed for home-level and community-level mobility and indicative of improved functional LE strength  Baseline: 07/27/23: Difficulty with sit to stand and heavy reliance on pushing from armrests.      10/05/23: Pt able to complete with multiple attempts, careful CGA/guarding.  Goal status: PARTIALLY MET    LONG TERM GOALS: Target date: 09/21/2023  Pt will increase FOTO to at least 52 to demonstrate significant improvement in function at home related to balance  Baseline: 07/27/23: 44.     10/05/23: 45/52 Goal status: NOT MET   2.  Pt will improve BERG by at least 3 points in order to demonstrate clinically significant improvement in balance.   Baseline: 07/27/23: To be performed on visit # 2.    08/10/23: 39/56     10/05/23: Update  next visit.       10/21/23: 38/56 Goal status: NOT MET  3.  Pt will improve ABC by at least 13% in order to demonstrate clinically significant improvement in balance confidence.      Baseline: 07/27/23: To be completed on visit # 2.       10/05/23: Update next visit.      10/21/23: 35.6% Goal status: INITIAL  4. Pt will improve DGI by at least 3 points in order to demonstrate clinically significant improvement in balance and decreased risk for falls.     Baseline: 07/27/23: To be performed on visit # 2.    08/12/23: 19/24     10/05/23: Update next visit.    10/21/23: 15/24 Goal status: NOT MET   5. Pt will decrease TUG to below 14 seconds/decrease in order to demonstrate decreased fall risk.  Baseline: 07/27/23: 28.5 sec     10/05/23: Update next visit.    10/21/23: 21.5 sec  Goal status: IN PROGRESS    PLAN: PT FREQUENCY: 2x/week  PT DURATION: 8 weeks  PLANNED INTERVENTIONS: Therapeutic exercises, Therapeutic activity, Neuromuscular re-education, Balance  training, Gait training, Patient/Family education, Electrical stimulation  PLAN FOR NEXT SESSION: Continue with low-level LE strengthening, postural control drills, progressive weight shifting and stepping drills as tolerated. Update HEP for LE strength and balance with successive visits.     Consuela Mimes, PT, DPT #[id number]  Kelly [name] 10/26/2023, 12:00 PM

## 2023-10-27 DIAGNOSIS — M79605 Pain in left leg: Secondary | ICD-10-CM | POA: Diagnosis not present

## 2023-10-27 DIAGNOSIS — R519 Headache, unspecified: Secondary | ICD-10-CM | POA: Diagnosis not present

## 2023-10-27 DIAGNOSIS — R2689 Other abnormalities of gait and mobility: Secondary | ICD-10-CM | POA: Diagnosis not present

## 2023-10-27 DIAGNOSIS — Z79899 Other long term (current) drug therapy: Secondary | ICD-10-CM | POA: Diagnosis not present

## 2023-10-27 DIAGNOSIS — R569 Unspecified convulsions: Secondary | ICD-10-CM | POA: Diagnosis not present

## 2023-10-28 ENCOUNTER — Ambulatory Visit: Payer: 59 | Admitting: Physical Therapy

## 2023-10-28 DIAGNOSIS — R2689 Other abnormalities of gait and mobility: Secondary | ICD-10-CM

## 2023-10-28 DIAGNOSIS — R262 Difficulty in walking, not elsewhere classified: Secondary | ICD-10-CM | POA: Diagnosis not present

## 2023-10-28 DIAGNOSIS — M6281 Muscle weakness (generalized): Secondary | ICD-10-CM | POA: Diagnosis not present

## 2023-10-28 NOTE — Therapy (Addendum)
OUTPATIENT PHYSICAL THERAPY TREATMENT  Patient Name: Kelly [name] MRN: [id number] DOB:[birth year], 57 y.o., female Today's Date: 10/28/2023    PT End of Session - 10/28/23 1116     Visit Number 9    Number of Visits 17    Date for PT Re-Evaluation 09/23/23    Authorization Type [employer] Medicare/Medicaid [id number]    PT Start Time 1116    PT Stop Time 1158    PT Time Calculation (min) 42 min    Activity Tolerance Patient limited by pain    Behavior During Therapy Anxious   Distressed             Past Medical History:  Diagnosis Date   Acid reflux    Anxiety    Hypercholesteremia    Myocardial infarction (HCC)    Schizophrenia (HCC)    Seizures (HCC)    Past Surgical History:  Procedure Laterality Date   CARDIAC CATHETERIZATION     CESAREAN SECTION     DILATION AND CURETTAGE OF UTERUS     FOOT SURGERY     TONSILLECTOMY     Patient Active Problem List   Diagnosis Date Noted   Left-sided weakness 07/11/2018   Schizoaffective disorder (HCC) 04/08/2016   Acute anxiety 04/08/2016    PCP: Morene Crocker, MD  REFERRING PROVIDER: [facility]  REFERRING DIAGNOSIS:  R26.89 (ICD-10-CM) - Imbalance     THERAPY DIAG: Imbalance  Difficulty in walking, not elsewhere classified  Muscle weakness (generalized)  ONSET DATE: 05/26/23 (worsening imbalance in last 2 months)  FOLLOW UP APPT WITH PROVIDER: Yes ; 08/26/23   RATIONALE FOR EVALUATION AND TREATMENT: Rehabilitation  SUBJECTIVE:                                                                                                                                                                                          Pertinent History  Pt is a 57 year old female referred for imbalance with Hx of seizure disorder, schizophrenia, anxiety, MI. Pt has worsening imbalance; difficulty with going up stairs. Difficulty picking up feet when walking. Pt reports episodes of her legs giving out. Pt reports L lower extremity  and L upper extremity discomfort; pt has L-sided weakness. Patient reports no Hx of TIA/CVA. Patient reports episode of lower extremities buckling when standing on her porch. Patient reports some sensory change/numbness affecting her L side; she reports paresthesias affecting L>R foot.  Pt does have notable seizure disorder.    Pain: Yes; L knee, L shoulder Numbness/Tingling: Yes; L>R foot; L-sided sensory loss in arm/leg  Focal Weakness: Yes; weakness L>RLE in thighs/legs Recent changes in overall health/medication: No recent changes  in medication  Prior history of physical therapy for balance:  Yes; previous episode of PT for balance  Falls: Has patient fallen in last 6 months? Yes, Number of falls: 5; some back pain after having fall  Directional pattern for falls: Yes; falling forward with BLE buckling  Imaging: Yes ;  03/09/23 Head CT  1. No acute intracranial abnormality.    Prior level of function: Independent with community mobility without device  Occupational demands: Disabled  Hobbies: None stated  Red flags (bowel/bladder changes, saddle paresthesia, personal history of cancer, h/o spinal tumors, h/o compression fx, h/o abdominal aneurysm, abdominal pain, chills/fever, night sweats, nausea, vomiting, unrelenting pain): Negative  Precautions: None  Weight Bearing Restrictions: No  Living Environment Lives with: lives alone; pt has medical pull cords in house to get emergency care prn.  Lives in: Apartment/assisted living complex Has following equipment at home: None   Patient Goals: Improve dragging LLE and weakness/stiffness of her upper limb     OBJECTIVE:   Patient Surveys  FOTO: 44, predicted improvement to 29 ABC: 35.6%   Cognition Patient is oriented to person, place, and time.  Recent memory is intact.  Remote memory is intact.  Attention span and concentration are intact.  Expressive speech is intact.  Patient's fund of knowledge is within normal  limits for educational level.     Gross Musculoskeletal Assessment Tremor: None Bulk: Normal Tone: Normal   GAIT: Distance walked: 40 ft Assistive device utilized: None Level of assistance: SBA Comments: Abductor lurch with R>L stance phase, decreased step length, dec stance time on LLE  *10/05/23: Heavy circumduction to clear each LE and lateral trunk flexon with contralateral swing phase, decreased single-limb support time bilaterally, decreased step length and step cadence   Posture: Self-selected sacral sitting and thoracolumbar kyphosis in static sitting/standing position    AROM  AROM (Normal range in degrees) AROM  07/27/2023  Lumbar   Flexion (65)   Extension (30)   Right lateral flexion (25)   Left lateral flexion (25)   Right rotation (30)   Left rotation (30)       Hip Right Left  Flexion (125)    Extension (15)    Abduction (40)    Adduction     Internal Rotation (45)    External Rotation (45)        Knee    Flexion (135)    Extension (0)        Ankle    Dorsiflexion (20)    Plantarflexion (50)    Inversion (35)    Eversion (15    (* = pain; Blank rows = not tested)   LE MMT:  MMT (out of 5) Right 07/27/2023 Left 07/27/2023 Right 10/05/23 Left 10/05/23  Hip flexion 4 3+ 4 4-  Hip extension      Hip abduction (seated) 4 4- 4 4  Hip adduction      Hip internal rotation      Hip external rotation      Knee flexion 4 4- 4+ 4-  Knee extension 4 4- 4+ 4  Ankle dorsiflexion 4 4- 4- 4-  Ankle plantarflexion      Ankle inversion      Ankle eversion      (* = pain; Blank rows = not tested)   Sensation Sensory loss along fibular head and medial malleolus bilat. Proprioception, and hot/cold testing deferred on this date.   Reflexes Deferred   Cranial Nerves Visual acuity and visual fields  are intact  Convergence insuffiencey Extraocular muscles are intact  Facial sensation is intact bilaterally  Facial strength is intact  bilaterally  Hearing is normal as tested by gross conversation Palate elevates midline, normal phonation  Shoulder shrug strength is intact  Tongue protrudes midline   Coordination/Cerebellar Finger to Nose: WNL Heel to Shin: WNL Rapid alternating movements: WNL Finger Opposition: WNL Pronator Drift: Negative   FUNCTIONAL OUTCOME MEASURES   Results Comments  BERG 39/56   DGI 19/24   TUG 28.5 seconds Fall risk, in need of intervention  5TSTS Unable to obtain baseline, difficulty with sit to stand   (Blank rows = not tested)   Clinical Test of Sensory Interaction for Balance    (CTSIB):  CONDITION TIME STRATEGY SWAY  Eyes open, firm surface 30 seconds Ankle   Eyes closed, firm surface 30 seconds ankle   Eyes open, foam surface 30 seconds Ankle, hip Increased c foam  Eyes closed, foam surface 9 seconds Ankle, hip       Increased c foam        TODAY'S TREATMENT    SUBJECTIVE STATEMENT:   Pt reports no significant concerns at arrival. She had f/u with neurology yesterday; she has referral to orthopedics. Patient reports minimal pain at arrival; mild knee pain at arrival to clinic. Pt feels that she tolerated last visit well.      Neuromuscular Re-education - for improved sensory integration, static and dynamic postural control, equilibrium and non-equilibrium coordination as needed for negotiating home and community environment and stepping over obstacles  Dynamic march on blue agility ladder; 3x D/B length of ladder  -PT CGA  Standing semitandem on Airex; multiple attempts with 10-20 sec intervals obtained on each side  Toe tapping; 6-inch step; 2x10 alternating   *not today* Standing FT on Airex; x 1 min without notable LOB  Single hurdle step over 10-lb cuff ankle weight; 1x15 with each LE Forward step-over 4 consecutive 1x4 boards, in // bars; 3x D/B  -heavy cueing for sequencing and foot placement prior to step-over  -repeated demo for  technique    Therapeutic Exercise - improved strength as needed to improve performance of CKC activities/functional movements and as needed for power production to prevent fall during episode of large postural perturbation   NuStep; x 5 minutes, Level 2 for 5 min;  -using upper/lower limbs and resting back against backrest  Seated march; 4-lb ankle weights; 2x10 alternating R/L  Standing heel raise/toe raise; 2x10 alternating    PATIENT EDUCATION: Discussed regression in DGI performance, relative stability with other goals including modest improvement with TUG. We discussed continued plan of care and expectations following exercise. We discussed phone call made yesterday to referring provider's office to consider outside referral for musculoskeletal LLE pain.     *not today* Standing march at table, UE support on edge of table; 2x10 alternating R/L with 5-lb ankle weights  Minisquat; x20  -heavy guarding and cueing for technique  -significant cueing to remain in small 30-45 deg ROM; pt wishes to squat lower        PATIENT EDUCATION:  Education details: see above for patient education details Person educated: Patient Education method: Explanation, Demonstration, and Handouts Education comprehension: verbalized understanding and returned demonstration   HOME EXERCISE PROGRAM: Access Code: [email or web address] URL: [email or web address] Date: 08/17/2023 Prepared by: Consuela Mimes  Exercises - Seated March  - 2 x daily - 7 x weekly - 2 sets - 10 reps - Seated Long Arc Quad  - 2  x daily - 7 x weekly - 2 sets - 10 reps - Sit to Stand with Armchair  - 2 x daily - 7 x weekly - 2-3 sets - 5-6 reps - Standing March with Counter Support  - 2 x daily - 7 x weekly - 2 sets - 10 reps - Heel Toe Raises with Counter Support  - 2 x daily - 7 x weekly - 2 sets - 10 reps    ASSESSMENT:  CLINICAL IMPRESSION: Patient does demonstrate improved gait pattern compared to last week  with improved stance time on LLE. Pt demonstrates safer long-distance gait with SPC to offload LLE. Pt does seem limited by LLE neuropathic pain and potential musculoskeletal pain affecting L knee more so than steadiness/postural control. We spoke with referring office regarding this issue, and she has new referral for orthopedics. Pt does still need significant work on strengthening, conditioning, fall risk management, and recovery of independent function. Pt has remaining deficits in L>RLE strength, postural control, gait deficits, and movement control necessitating further PT intervention. Patient will benefit from skilled PT to address above impairments and improve overall function.  REHAB POTENTIAL: Fair due to significant psych component and L-sided sensory/motor changes  CLINICAL DECISION MAKING: Unstable/unpredictable  EVALUATION COMPLEXITY: High   GOALS: Goals reviewed with patient? Yes  SHORT TERM GOALS: Target date: 08/17/2023  Pt will be independent with HEP in order to improve strength and balance in order to decrease fall risk and improve function at home. Baseline: 07/27/23: Baseline HEP initiated.      10/05/23: Pt was bed bound several days and had to miss multiple days of HEP Goal status: NOT MET   Pt will perform independent sit to stand with no upper extremity support as needed for home-level and community-level mobility and indicative of improved functional LE strength  Baseline: 07/27/23: Difficulty with sit to stand and heavy reliance on pushing from armrests.      10/05/23: Pt able to complete with multiple attempts, careful CGA/guarding.  Goal status: PARTIALLY MET    LONG TERM GOALS: Target date: 09/21/2023  Pt will increase FOTO to at least 52 to demonstrate significant improvement in function at home related to balance  Baseline: 07/27/23: 44.     10/05/23: 45/52 Goal status: NOT MET   2.  Pt will improve BERG by at least 3 points in order to demonstrate clinically  significant improvement in balance.   Baseline: 07/27/23: To be performed on visit # 2.    08/10/23: 39/56     10/05/23: Update next visit.       10/21/23: 38/56 Goal status: NOT MET  3.  Pt will improve ABC by at least 13% in order to demonstrate clinically significant improvement in balance confidence.      Baseline: 07/27/23: To be completed on visit # 2.       10/05/23: Update next visit.      10/21/23: 35.6% Goal status: INITIAL  4. Pt will improve DGI by at least 3 points in order to demonstrate clinically significant improvement in balance and decreased risk for falls.     Baseline: 07/27/23: To be performed on visit # 2.    08/12/23: 19/24     10/05/23: Update next visit.    10/21/23: 15/24 Goal status: NOT MET   5. Pt will decrease TUG to below 14 seconds/decrease in order to demonstrate decreased fall risk.  Baseline: 07/27/23: 28.5 sec     10/05/23: Update next visit.    10/21/23:  21.5 sec  Goal status: IN PROGRESS    PLAN: PT FREQUENCY: 2x/week  PT DURATION: 8 weeks  PLANNED INTERVENTIONS: Therapeutic exercises, Therapeutic activity, Neuromuscular re-education, Balance training, Gait training, Patient/Family education, Electrical stimulation  PLAN FOR NEXT SESSION: Continue with low-level LE strengthening, postural control drills, progressive weight shifting and stepping drills as tolerated. Update HEP for LE strength and balance with successive visits.    Consuela Mimes, PT, DPT #[id number]  Gertie Exon 10/28/2023, 11:16 AM

## 2023-11-01 ENCOUNTER — Encounter: Payer: Self-pay | Admitting: Physical Therapy

## 2023-11-02 ENCOUNTER — Other Ambulatory Visit: Payer: Self-pay

## 2023-11-02 ENCOUNTER — Emergency Department: Payer: 59

## 2023-11-02 ENCOUNTER — Ambulatory Visit: Payer: 59 | Admitting: Physical Therapy

## 2023-11-02 ENCOUNTER — Emergency Department
Admission: EM | Admit: 2023-11-02 | Discharge: 2023-11-02 | Disposition: A | Discharge status: Home / Self Care | Payer: 59 | Attending: Emergency Medicine | Admitting: Emergency Medicine

## 2023-11-02 ENCOUNTER — Encounter: Payer: Self-pay | Admitting: Physical Therapy

## 2023-11-02 DIAGNOSIS — Z743 Need for continuous supervision: Secondary | ICD-10-CM | POA: Diagnosis not present

## 2023-11-02 DIAGNOSIS — R9431 Abnormal electrocardiogram [ECG] [EKG]: Secondary | ICD-10-CM | POA: Insufficient documentation

## 2023-11-02 DIAGNOSIS — R262 Difficulty in walking, not elsewhere classified: Secondary | ICD-10-CM | POA: Diagnosis not present

## 2023-11-02 DIAGNOSIS — R2689 Other abnormalities of gait and mobility: Secondary | ICD-10-CM

## 2023-11-02 DIAGNOSIS — I517 Cardiomegaly: Secondary | ICD-10-CM | POA: Insufficient documentation

## 2023-11-02 DIAGNOSIS — G40909 Epilepsy, unspecified, not intractable, without status epilepticus: Secondary | ICD-10-CM | POA: Insufficient documentation

## 2023-11-02 DIAGNOSIS — R569 Unspecified convulsions: Secondary | ICD-10-CM

## 2023-11-02 DIAGNOSIS — M6281 Muscle weakness (generalized): Secondary | ICD-10-CM | POA: Diagnosis not present

## 2023-11-02 DIAGNOSIS — R0902 Hypoxemia: Secondary | ICD-10-CM | POA: Diagnosis not present

## 2023-11-02 LAB — BASIC METABOLIC PANEL
Anion gap: 10 (ref 5–15)
BUN: 15 mg/dL (ref 6–20)
CO2: 26 mmol/L (ref 22–32)
Calcium: 9.1 mg/dL (ref 8.9–10.3)
Chloride: 100 mmol/L (ref 98–111)
Creatinine, Ser: 0.79 mg/dL (ref 0.44–1.00)
GFR, Estimated: >60 mL/min (ref >60)
Glucose, Bld: 80 mg/dL (ref 70–99)
Potassium: 3.7 mmol/L (ref 3.5–5.1)
Sodium: 136 mmol/L (ref 135–145)

## 2023-11-02 LAB — CBC
HCT: 36.7 % (ref 36.0–46.0)
Hemoglobin: 12 g/dL (ref 12.0–15.0)
MCH: 26.8 pg (ref 26.0–34.0)
MCHC: 32.7 g/dL (ref 30.0–36.0)
MCV: 81.9 fL (ref 80.0–100.0)
Platelets: 283 10*3/uL (ref 150–400)
RBC: 4.48 MIL/uL (ref 3.87–5.11)
RDW: 17.1 % — ABNORMAL HIGH (ref 11.5–15.5)
WBC: 11.4 10*3/uL — ABNORMAL HIGH (ref 4.0–10.5)
nRBC: 0 % (ref 0.0–0.2)

## 2023-11-02 MED ORDER — KETOROLAC TROMETHAMINE 10 MG PO TABS
10.0000 mg | ORAL_TABLET | Freq: Once | ORAL | Status: DC
  Filled 2023-11-02: qty 1

## 2023-11-02 MED ORDER — KETOROLAC TROMETHAMINE 10 MG PO TABS
ORAL_TABLET | ORAL | Status: AC
  Filled 2023-11-02: qty 1

## 2023-11-02 NOTE — ED Provider Notes (Signed)
[facility] Provider Note   Event Date/Time   First MD Initiated Contact with Patient 11/02/23 2034     (approximate) History  Seizures  HPI Kelly [name] is a 57 y.o. female with a stated past medical history of epilepsy who presents via EMS from her physical therapy appointment where she had an episode of seizure-like activity.  Patient states that she remembers going to physical therapy and then waking up in our emergency department.  Patient denies any change in her antiepileptic medications and denies any medication nonadherence.  Patient states that she has had some abdominal pain and nausea over the last 24 hours without any vomiting or diarrhea. ROS: Patient currently denies any vision changes, tinnitus, difficulty speaking, facial droop, sore throat, chest pain, shortness of breath, abdominal pain, dysuria, or weakness/numbness/paresthesias in any extremity   Physical Exam  Triage Vital Signs: ED Triage Vitals [11/02/23 1340]  Encounter Vitals Group     BP (!) 162/92     Systolic BP Percentile      Diastolic BP Percentile      Pulse Rate 91     Resp 20     Temp 98 F (36.7 C)     Temp Source Oral     SpO2 98 %     Weight      Height      Head Circumference      Peak Flow      Pain Score 0     Pain Loc      Pain Education      Exclude from Growth Chart    Most recent vital signs: Vitals:   11/02/23 1340 11/02/23 1750  BP: (!) 162/92 139/62  Pulse: 91 90  Resp: 20 18  Temp: 98 F (36.7 C) 98 F (36.7 C)  SpO2: 98% 98%   General: Awake, oriented x4. CV:  Good peripheral perfusion.  Resp:  Normal effort.  Abd:  No distention.  Other:  Middle-aged obese African-American female resting comfortably in no acute distress ED Results / Procedures / Treatments  Labs (all labs ordered are listed, but only abnormal results are displayed) Labs Reviewed  CBC - Abnormal; Notable for the following components:      Result Value   WBC 11.4 (*)     RDW 17.1 (*)    All other components within normal limits  BASIC METABOLIC PANEL  CBG MONITORING, ED   RADIOLOGY ED MD interpretation: CT of the head without contrast interpreted by me shows no evidence of acute abnormalities including no intracerebral hemorrhage, obvious masses, or significant edema -Agree with radiology assessment Official radiology report(s): CT Head Wo Contrast Result Date: 11/02/2023 CLINICAL DATA:  Seizure EXAM: CT HEAD WITHOUT CONTRAST TECHNIQUE: Contiguous axial images were obtained from the base of the skull through the vertex without intravenous contrast. RADIATION DOSE REDUCTION: This exam was performed according to the departmental dose-optimization program which includes automated exposure control, adjustment of the mA and/or kV according to patient size and/or use of iterative reconstruction technique. COMPARISON:  CT head 03/09/2023 FINDINGS: Brain: No intracranial hemorrhage, mass effect, or evidence of acute infarct. No hydrocephalus. No extra-axial fluid collection. Vascular: No hyperdense vessel or unexpected calcification. Skull: No fracture or focal lesion. Sinuses/Orbits: No acute finding. Other: None. IMPRESSION: No acute intracranial abnormality. Electronically Signed   By: Minerva Fester M.D.   On: 11/02/2023 17:06   PROCEDURES: Critical Care performed: No .1-3 Lead EKG Interpretation  Performed by: Merwyn Katos, MD  Authorized by: Merwyn Katos, MD     Interpretation: normal     ECG rate:  91   ECG rate assessment: normal     Rhythm: sinus rhythm     Ectopy: none     Conduction: normal    MEDICATIONS ORDERED IN ED: Medications  ketorolac (TORADOL) tablet 10 mg (has no administration in time range)   IMPRESSION / MDM / ASSESSMENT AND PLAN / ED COURSE  I reviewed the triage vital signs and the nursing notes.                             The patient is on the cardiac monitor to evaluate for evidence of arrhythmia and/or significant  heart rate changes. Patient's presentation is most consistent with acute presentation with potential threat to life or bodily function. Patient presents after recent seizure episode.  Patient had slow return to baseline mental and physical function per EMS and ED staff. No immunosuppresion hx and had no preceding fever. No history of alcohol abuse or suspicion for toxin ingestion. Unlikely stroke, syncope. Unlikely infectious etiology. No preceding trauma.  Workup: EKG, BMP, POCT glucose (pregnancy test if female) and CT Brain.  Field Interventions: None ED Interventions: None  Patient has had no further seizure activity despite no interventions in emergency department.  The patient has been reexamined and is ready to be discharged.  All diagnostic results have been reviewed and discussed with the patient/family.  Care plan has been outlined and the patient/family understands all current diagnoses, results, and treatment plans.  There are no new complaints, changes, or physical findings at this time.  All questions have been addressed and answered.  Patient was instructed to, and agrees to follow-up with their primary care physician as well as return to the emergency department if any new or worsening symptoms develop. Disposition: Discharge home with primary care follow up in next 24-48 hours.   FINAL CLINICAL IMPRESSION(S) / ED DIAGNOSES   Final diagnoses:  Seizure (HCC)   Rx / DC Orders   ED Discharge Orders     None      Note:  This document was prepared using Dragon voice recognition software and may include unintentional dictation errors.   Merwyn Katos, MD 11/02/23 [phone]

## 2023-11-02 NOTE — ED Provider Triage Note (Signed)
Emergency Medicine Provider Triage Evaluation Note  Kelly [name] , [name] 57 y.o. female  was evaluated in triage.  Pt complains of seizure at home.  Brought in by EMS.  Patient poor historian at this time. AAO x1.  Patient has history of seizures.   Review of Systems  Positive:  Negative:   Physical Exam  BP (!) 162/92   Pulse 91   Temp 98 F (36.7 C) (Oral)   Resp 20   LMP  (LMP Unknown)   SpO2 98%  Gen:   Awake, no distress   Resp:  Normal effort LCTAB MSK:   Moves extremities without difficulty  Other:  grip strength equal bilaterally. 4/5 UE strength.   Medical Decision Making  Medically screening exam initiated at 1:43 PM.  Appropriate orders placed.  Kelly [name] was informed that the remainder of the evaluation will be completed by another provider, this initial triage assessment does not replace that evaluation, and the importance of remaining in the ED until their evaluation is complete.    Kelly [name], Kelly [name] A, PA-C 11/02/23 1345

## 2023-11-02 NOTE — ED Triage Notes (Addendum)
Pt to ED via [facility] from home. Pt unsure of what happened. EMS report on back of stickers states pt with witnessed seizure and was laid down onto ground. Pt with hx of seizures and reports med compliant. Pt is alert to self but unsure of time and place. Pt denies auro before seizure.

## 2023-11-02 NOTE — Therapy (Signed)
OUTPATIENT PHYSICAL THERAPY TREATMENT AND PROGRESS NOTE   Dates of reporting period  10/05/23   to   11/02/23   Patient Name: Kelly [name] MRN: [id number] DOB:1966/05/28, 57 y.o., female Today's Date: 11/02/2023    PT End of Session - 11/02/23 1128     Visit Number 10    Number of Visits 17    Date for PT Re-Evaluation 09/23/23    Authorization Type [employer]HC Medicare/Medicaid [id number]    PT Start Time 1120    PT Stop Time 1207    PT Time Calculation (min) 47 min    Activity Tolerance Patient limited by pain    Behavior During Therapy Anxious   Distressed             Past Medical History:  Diagnosis Date   Acid reflux    Anxiety    Hypercholesteremia    Myocardial infarction (HCC)    Schizophrenia (HCC)    Seizures (HCC)    Past Surgical History:  Procedure Laterality Date   CARDIAC CATHETERIZATION     CESAREAN SECTION     DILATION AND CURETTAGE OF UTERUS     FOOT SURGERY     TONSILLECTOMY     Patient Active Problem List   Diagnosis Date Noted   Left-sided weakness 07/11/2018   Schizoaffective disorder (HCC) 04/08/2016   Acute anxiety 04/08/2016    PCP: Morene Crocker, MD  REFERRING PROVIDER: [facility]  REFERRING DIAGNOSIS:  R26.89 (ICD-10-CM) - Imbalance     THERAPY DIAG: Imbalance  Difficulty in walking, not elsewhere classified  Muscle weakness (generalized)  ONSET DATE: 05/26/23 (worsening imbalance in last 2 months)  FOLLOW UP APPT WITH PROVIDER: Yes ; 08/26/23   RATIONALE FOR EVALUATION AND TREATMENT: Rehabilitation  SUBJECTIVE:                                                                                                                                                                                          Pertinent History  Pt is a 57 year old female referred for imbalance with Hx of seizure disorder, schizophrenia, anxiety, MI. Pt has worsening imbalance; difficulty with going up stairs. Difficulty picking up feet when  walking. Pt reports episodes of her legs giving out. Pt reports L lower extremity and L upper extremity discomfort; pt has L-sided weakness. Patient reports no Hx of TIA/CVA. Patient reports episode of lower extremities buckling when standing on her porch. Patient reports some sensory change/numbness affecting her L side; she reports paresthesias affecting L>R foot.  Pt does have notable seizure disorder.    Pain: Yes; L knee, L shoulder Numbness/Tingling: Yes; L>R foot; L-sided sensory loss  in arm/leg  Focal Weakness: Yes; weakness L>RLE in thighs/legs Recent changes in overall health/medication: No recent changes in medication  Prior history of physical therapy for balance:  Yes; previous episode of PT for balance  Falls: Has patient fallen in last 6 months? Yes, Number of falls: 5; some back pain after having fall  Directional pattern for falls: Yes; falling forward with BLE buckling  Imaging: Yes ;  03/09/23 Head CT  1. No acute intracranial abnormality.    Prior level of function: Independent with community mobility without device  Occupational demands: Disabled  Hobbies: None stated  Red flags (bowel/bladder changes, saddle paresthesia, personal history of cancer, h/o spinal tumors, h/o compression fx, h/o abdominal aneurysm, abdominal pain, chills/fever, night sweats, nausea, vomiting, unrelenting pain): Negative  Precautions: None  Weight Bearing Restrictions: No  Living Environment Lives with: lives alone; pt has medical pull cords in house to get emergency care prn.  Lives in: Apartment/assisted living complex Has following equipment at home: None   Patient Goals: Improve dragging LLE and weakness/stiffness of her upper limb     OBJECTIVE:   Patient Surveys  FOTO: 44, predicted improvement to 9 ABC: 35.6%   Cognition Patient is oriented to person, place, and time.  Recent memory is intact.  Remote memory is intact.  Attention span and concentration are  intact.  Expressive speech is intact.  Patient's fund of knowledge is within normal limits for educational level.     Gross Musculoskeletal Assessment Tremor: None Bulk: Normal Tone: Normal   GAIT: Distance walked: 40 ft Assistive device utilized: None Level of assistance: SBA Comments: Abductor lurch with R>L stance phase, decreased step length, dec stance time on LLE  *10/05/23: Heavy circumduction to clear each LE and lateral trunk flexon with contralateral swing phase, decreased single-limb support time bilaterally, decreased step length and step cadence   Posture: Self-selected sacral sitting and thoracolumbar kyphosis in static sitting/standing position    AROM  AROM (Normal range in degrees) AROM  07/27/2023  Lumbar   Flexion (65)   Extension (30)   Right lateral flexion (25)   Left lateral flexion (25)   Right rotation (30)   Left rotation (30)       Hip Right Left  Flexion (125)    Extension (15)    Abduction (40)    Adduction     Internal Rotation (45)    External Rotation (45)        Knee    Flexion (135)    Extension (0)        Ankle    Dorsiflexion (20)    Plantarflexion (50)    Inversion (35)    Eversion (15    (* = pain; Blank rows = not tested)   LE MMT:  MMT (out of 5) Right 07/27/2023 Left 07/27/2023 Right 10/05/23 Left 10/05/23  Hip flexion 4 3+ 4 4-  Hip extension      Hip abduction (seated) 4 4- 4 4  Hip adduction      Hip internal rotation      Hip external rotation      Knee flexion 4 4- 4+ 4-  Knee extension 4 4- 4+ 4  Ankle dorsiflexion 4 4- 4- 4-  Ankle plantarflexion      Ankle inversion      Ankle eversion      (* = pain; Blank rows = not tested)   Sensation Sensory loss along fibular head and medial malleolus bilat. Proprioception, and hot/cold  testing deferred on this date.   Reflexes Deferred   Cranial Nerves Visual acuity and visual fields are intact  Convergence insuffiencey Extraocular muscles are  intact  Facial sensation is intact bilaterally  Facial strength is intact bilaterally  Hearing is normal as tested by gross conversation Palate elevates midline, normal phonation  Shoulder shrug strength is intact  Tongue protrudes midline   Coordination/Cerebellar Finger to Nose: WNL Heel to Shin: WNL Rapid alternating movements: WNL Finger Opposition: WNL Pronator Drift: Negative   FUNCTIONAL OUTCOME MEASURES   Results Comments  BERG 39/56   DGI 19/24   TUG 28.5 seconds Fall risk, in need of intervention  5TSTS Unable to obtain baseline, difficulty with sit to stand   (Blank rows = not tested)   Clinical Test of Sensory Interaction for Balance    (CTSIB):  CONDITION TIME STRATEGY SWAY  Eyes open, firm surface 30 seconds Ankle   Eyes closed, firm surface 30 seconds ankle   Eyes open, foam surface 30 seconds Ankle, hip Increased c foam  Eyes closed, foam surface 9 seconds Ankle, hip       Increased c foam        TODAY'S TREATMENT    SUBJECTIVE STATEMENT:   Pt feels that she is doing relatively well with getting around at this time. She states she is not using cane as often in her home. She reports mainly being limited with LLE weakness. She has some mild to moderate L anterior knee pain recently. Pt feels that she is doing generally well with PT at this time; she feels she needs continued work on ability to transfer and ambulate at home level.      Neuromuscular Re-education - for improved sensory integration, static and dynamic postural control, equilibrium and non-equilibrium coordination as needed for negotiating home and community environment and stepping over obstacles  Dynamic march on blue agility ladder; 3x D/B length of ladder  -PT CGA  Sidestepping along blue agility ladder; 4x D/B  Toe tapping; 6-inch step; 2x10 alternating  *next visit* Standing semitandem on Airex; multiple attempts with 10-20 sec intervals obtained on each side   *not  today* Standing FT on Airex; x 1 min without notable LOB  Single hurdle step over 10-lb cuff ankle weight; 1x15 with each LE Forward step-over 4 consecutive 1x4 boards, in // bars; 3x D/B  -heavy cueing for sequencing and foot placement prior to step-over  -repeated demo for technique    Therapeutic Exercise - improved strength as needed to improve performance of CKC activities/functional movements and as needed for power production to prevent fall during episode of large postural perturbation   NuStep; x 7 minutes, Level 2;  -using upper/lower limbs and resting back against backrest  Seated march; 4-lb ankle weights; 2x10 alternating R/L  Sit to Stand, standard chair; 2x10  FOTO update completed*   PATIENT EDUCATION: Discussed current progress in PT, recommendation for continued plan of care, prognosis.      *not today* Standing heel raise/toe raise; 2x10 alternating Standing march at table, UE support on edge of table; 2x10 alternating R/L with 5-lb ankle weights  Minisquat; x20  -heavy guarding and cueing for technique  -significant cueing to remain in small 30-45 deg ROM; pt wishes to squat lower       Emergent Event -Patient had seizure while waiting in lobby (seated in lobby chair) following completion of PT session; seizure activity for 2 minutes. Pt was brought to floor, dependent transfer x 3 (3 physical  therapists on staff in office). Patient was placed in R sidelying position to avoid choking on saliva or vomit. Patient had loss of bladder control during seizure activity. Pt verbally unresponsive immediately following completion of seizure with continued regular respirations and pulse present. Vitals noted below.  Vitals HR: 100 O2 saturation 85-90% BP: 140/102 mmHg  Staff called EMS following seizure with pt remaining verbally unresponsive. Pt is responsive to nociceptive stimuli. Vitals remained stable; ongoing tachycardia. No active seizure activity prior  to EMS arrival. Patient history was given to EMS and incident was described to emergency medical providers prior to pt being transported for emergency medical care. Pt beginning to awaken with minimal verbal response while being transported out with EMS.        PATIENT EDUCATION:  Education details: see above for patient education details Person educated: Patient Education method: Explanation, Demonstration, and Handouts Education comprehension: verbalized understanding and returned demonstration   HOME EXERCISE PROGRAM: Access Code: [email or web address] URL: [email or web address] Date: 08/17/2023 Prepared by: Consuela Mimes  Exercises - Seated March  - 2 x daily - 7 x weekly - 2 sets - 10 reps - Seated Long Arc Quad  - 2 x daily - 7 x weekly - 2 sets - 10 reps - Sit to Stand with Armchair  - 2 x daily - 7 x weekly - 2-3 sets - 5-6 reps - Standing March with Counter Support  - 2 x daily - 7 x weekly - 2 sets - 10 reps - Heel Toe Raises with Counter Support  - 2 x daily - 7 x weekly - 2 sets - 10 reps    ASSESSMENT:  CLINICAL IMPRESSION: Patient has been tolerating activity better and exhibits improved ability to perform sit to stand. Her FOTO score has moderately improved, but she has not yet met long-term goal for this. Pt had several weeks out of PT with comorbid pysch issues and pt reporting pain with being largely bed bound. Pt has fortunately improved since then, and she stated today that she is finding herself using cane less in her home. Patient does exhibit ongoing compensated Trendelenburg and hip circumduction to clear foot (worse on L side). After completion of session today and discussion of plan of care, patient has seizure while sitting in lobby waiting on transportation. Patient was placed on floor in sidelying to prevent choking and EMS was called. Pt had strong pulse rate and regular respirations throughout. Pt had no verbal response, but she was beginning to  awaken as EMS transported her from office to emergency care. Pt has remaining deficits in L>RLE strength, postural control, gait deficits, and movement control necessitating further PT intervention. Patient will benefit from skilled PT to address above impairments and improve overall function.  REHAB POTENTIAL: Fair due to significant psych component and L-sided sensory/motor changes  CLINICAL DECISION MAKING: Unstable/unpredictable  EVALUATION COMPLEXITY: High   GOALS: Goals reviewed with patient? Yes  SHORT TERM GOALS: Target date: 08/17/2023  Pt will be independent with HEP in order to improve strength and balance in order to decrease fall risk and improve function at home. Baseline: 07/27/23: Baseline HEP initiated.      10/05/23: Pt was bed bound several days and had to miss multiple days of HEP.   11/02/23: Pt reports completing HEP inconsistently.  Goal status: NOT MET   Pt will perform independent sit to stand with no upper extremity support as needed for home-level and community-level mobility and indicative of improved functional LE strength  Baseline:  07/27/23: Difficulty with sit to stand and heavy reliance on pushing from armrests.      10/05/23: Pt able to complete with multiple attempts, careful CGA/guarding.    11/02/23: Pt performs sit to stand with no UE assist on first attempt readily.  Goal status: ACHIEVED   LONG TERM GOALS: Target date: 09/21/2023  Pt will increase FOTO to at least 52 to demonstrate significant improvement in function at home related to balance  Baseline: 07/27/23: 44.     10/05/23: 45/52      11/02/23: 50/52 Goal status: IN PROGRESS   2.  Pt will improve BERG by at least 3 points in order to demonstrate clinically significant improvement in balance.   Baseline: 07/27/23: To be performed on visit # 2.    08/10/23: 39/56     10/05/23: Update next visit.       10/21/23: 38/56 Goal status: NOT MET  3.  Pt will improve ABC by at least 13% in order to  demonstrate clinically significant improvement in balance confidence.      Baseline: 07/27/23: To be completed on visit # 2.       10/05/23: Update next visit.      10/21/23: 35.6% Goal status: INITIAL  4. Pt will improve DGI by at least 3 points in order to demonstrate clinically significant improvement in balance and decreased risk for falls.     Baseline: 07/27/23: To be performed on visit # 2.    08/12/23: 19/24     10/05/23: Update next visit.    10/21/23: 15/24 Goal status: NOT MET   5. Pt will decrease TUG to below 14 seconds/decrease in order to demonstrate decreased fall risk.  Baseline: 07/27/23: 28.5 sec     10/05/23: Update next visit.    10/21/23: 21.5 sec  Goal status: IN PROGRESS    PLAN: PT FREQUENCY: 2x/week  PT DURATION: 8 weeks  PLANNED INTERVENTIONS: Therapeutic exercises, Therapeutic activity, Neuromuscular re-education, Balance training, Gait training, Patient/Family education, Electrical stimulation  PLAN FOR NEXT SESSION: Continue with low-level LE strengthening, postural control drills, progressive weight shifting and stepping drills as tolerated. Update HEP for LE strength and balance with successive visits.    Consuela Mimes, PT, DPT #[id number]  Gertie Exon 11/02/2023, 1:41 PM

## 2023-11-04 ENCOUNTER — Ambulatory Visit: Payer: 59 | Admitting: Physical Therapy

## 2023-11-08 ENCOUNTER — Ambulatory Visit: Payer: 59 | Admitting: Family Medicine

## 2023-11-18 ENCOUNTER — Encounter: Payer: 59 | Admitting: Physical Therapy

## 2023-11-23 ENCOUNTER — Encounter: Payer: 59 | Admitting: Physical Therapy

## 2023-11-25 ENCOUNTER — Encounter: Payer: 59 | Admitting: Physical Therapy

## 2023-11-25 DIAGNOSIS — Z1231 Encounter for screening mammogram for malignant neoplasm of breast: Secondary | ICD-10-CM | POA: Diagnosis not present

## 2023-11-25 LAB — HM MAMMOGRAPHY

## 2023-11-30 ENCOUNTER — Encounter: Payer: 59 | Admitting: Physical Therapy

## 2023-11-30 DIAGNOSIS — M1712 Unilateral primary osteoarthritis, left knee: Secondary | ICD-10-CM | POA: Diagnosis not present

## 2023-11-30 DIAGNOSIS — M25562 Pain in left knee: Secondary | ICD-10-CM | POA: Diagnosis not present

## 2023-12-02 ENCOUNTER — Encounter: Payer: 59 | Admitting: Physical Therapy

## 2023-12-13 DIAGNOSIS — G250 Essential tremor: Secondary | ICD-10-CM | POA: Diagnosis not present

## 2023-12-13 DIAGNOSIS — R519 Headache, unspecified: Secondary | ICD-10-CM | POA: Diagnosis not present

## 2023-12-13 DIAGNOSIS — R569 Unspecified convulsions: Secondary | ICD-10-CM | POA: Diagnosis not present

## 2023-12-13 DIAGNOSIS — Z87898 Personal history of other specified conditions: Secondary | ICD-10-CM | POA: Diagnosis not present

## 2023-12-13 DIAGNOSIS — R2689 Other abnormalities of gait and mobility: Secondary | ICD-10-CM | POA: Diagnosis not present

## 2023-12-13 DIAGNOSIS — R42 Dizziness and giddiness: Secondary | ICD-10-CM | POA: Diagnosis not present

## 2023-12-13 DIAGNOSIS — G479 Sleep disorder, unspecified: Secondary | ICD-10-CM | POA: Diagnosis not present

## 2023-12-13 DIAGNOSIS — M79605 Pain in left leg: Secondary | ICD-10-CM | POA: Diagnosis not present

## 2023-12-13 DIAGNOSIS — M25569 Pain in unspecified knee: Secondary | ICD-10-CM | POA: Diagnosis not present

## 2023-12-20 ENCOUNTER — Ambulatory Visit (INDEPENDENT_AMBULATORY_CARE_PROVIDER_SITE_OTHER): Payer: 59 | Admitting: Family Medicine

## 2023-12-20 ENCOUNTER — Encounter: Payer: Self-pay | Admitting: Family Medicine

## 2023-12-20 VITALS — BP 132/82 | HR 90 | Temp 94.3°F | Resp 16 | Ht 65.0 in | Wt 215.7 lb

## 2023-12-20 DIAGNOSIS — R569 Unspecified convulsions: Secondary | ICD-10-CM | POA: Diagnosis not present

## 2023-12-20 DIAGNOSIS — Z91148 Patient's other noncompliance with medication regimen for other reason: Secondary | ICD-10-CM | POA: Diagnosis not present

## 2023-12-20 DIAGNOSIS — E782 Mixed hyperlipidemia: Secondary | ICD-10-CM | POA: Insufficient documentation

## 2023-12-20 DIAGNOSIS — E7800 Pure hypercholesterolemia, unspecified: Secondary | ICD-10-CM | POA: Insufficient documentation

## 2023-12-20 DIAGNOSIS — Z1159 Encounter for screening for other viral diseases: Secondary | ICD-10-CM

## 2023-12-20 DIAGNOSIS — G4733 Obstructive sleep apnea (adult) (pediatric): Secondary | ICD-10-CM | POA: Diagnosis not present

## 2023-12-20 DIAGNOSIS — I10 Essential (primary) hypertension: Secondary | ICD-10-CM | POA: Diagnosis not present

## 2023-12-20 DIAGNOSIS — R296 Repeated falls: Secondary | ICD-10-CM | POA: Diagnosis not present

## 2023-12-20 DIAGNOSIS — E66812 Obesity, class 2: Secondary | ICD-10-CM | POA: Diagnosis not present

## 2023-12-20 DIAGNOSIS — Z130 Encounter for screening for diseases of the blood and blood-forming organs and certain disorders involving the immune mechanism: Secondary | ICD-10-CM

## 2023-12-20 DIAGNOSIS — Z6835 Body mass index (BMI) 35.0-35.9, adult: Secondary | ICD-10-CM

## 2023-12-20 DIAGNOSIS — G43909 Migraine, unspecified, not intractable, without status migrainosus: Secondary | ICD-10-CM | POA: Diagnosis not present

## 2023-12-20 DIAGNOSIS — R251 Tremor, unspecified: Secondary | ICD-10-CM

## 2023-12-20 DIAGNOSIS — F250 Schizoaffective disorder, bipolar type: Secondary | ICD-10-CM

## 2023-12-20 DIAGNOSIS — J4520 Mild intermittent asthma, uncomplicated: Secondary | ICD-10-CM | POA: Diagnosis not present

## 2023-12-20 DIAGNOSIS — G4730 Sleep apnea, unspecified: Secondary | ICD-10-CM | POA: Insufficient documentation

## 2023-12-20 DIAGNOSIS — Z7689 Persons encountering health services in other specified circumstances: Secondary | ICD-10-CM

## 2023-12-20 MED ORDER — VENTOLIN HFA 108 (90 BASE) MCG/ACT IN AERS: 2.0000 | INHALATION_SPRAY | Freq: Four times a day (QID) | RESPIRATORY_TRACT | 11 refills | Status: DC | PRN

## 2023-12-20 NOTE — Assessment & Plan Note (Signed)
Chronic, stable Has prn duonebs - no refill needed per pt Needed refill on prn albuterol inhaler Pt states does not like breztri aerosphere daily inhaler - she is not taking it too strong for her Clear breath sounds during visit, SP02 - 96% RA, no distress Tobacco cessation discussed, will to cut down Denies COPD hx, states asthma and says controlled during visit Will plan to reassess at next visit

## 2023-12-20 NOTE — Assessment & Plan Note (Signed)
Per review pt has hx of headaches Nortriptyline 40mg  nightly per Neuro note Neuro note also states gabapentin 300 mg during day and 900 mg at night for neck pain related to headaches.

## 2023-12-20 NOTE — Assessment & Plan Note (Signed)
Per pt related to AEDs and hx of psych meds Neurp have been working with her on managing tremors

## 2023-12-20 NOTE — Assessment & Plan Note (Addendum)
Hx of frequent falls due to imbalance Improved since working with PT Recently saw ortho for knee injection, pain and balance improved Will reassess at next visit Ambulates without assistance, gait intake during visit

## 2023-12-20 NOTE — Assessment & Plan Note (Addendum)
Chronic, elevated during vist Goal <130/80 Pt unsure of what BP medications she is actually taking Confusing medication list brought to visit Per most recent neuro note - pt is taking losartan 25 BID, not what was listed on list pt brought, not what she mentioned during rooming May also be on clonidine 0.1 daily and amlodipine 5mg  - planning to follow up Instructed pt to bring in all medication bottles to properly reconcile for pt safety Referral to [facility] for med mgmt assistance

## 2023-12-20 NOTE — Assessment & Plan Note (Addendum)
Managed by Neurology through [facility], Dr. Malvin Johns typically experiences breakthrough seizures 8-10 per year - last 11/02/23 per neuro note Denies"aura" will just feel off prior to seizure, will lose consciousness Increased word finding Currently managed with Depakote 500 mg BID, Lamictal 200 mg am and 300mg  pm, and Vimpat 200mg  BID. Reports tremors, possibly medication side effect. -Continue current seizure medications. -continue to wear safety arm band for emergencies

## 2023-12-20 NOTE — Assessment & Plan Note (Addendum)
Depression with psychosis Increased word finding issues, tangential conversation during visit Neurology has been managing, but recent referral to reestablish with Psych through [facility] Pt has planned appt on 12/23/23 via virtual - pt showed provider text message appt confirmation Per neuro note Risperdal 1.5 nightly Pt was on seroquel - she did not like how drowsy she got - stopped F/u 4 weeks for mood check and ensuring pt established with psych Concerns for pt not aware of actual medications she takes Referral to [facility] for assistance

## 2023-12-20 NOTE — Assessment & Plan Note (Signed)
Chronic Lipid panel checked Continue atorvastatin 40 mg daily - hx of MI

## 2023-12-20 NOTE — Progress Notes (Signed)
New Patient Office Visit  Introduced to nurse practitioner role and practice setting.  All questions answered.  Discussed provider/patient relationship and expectations.   Subjective    Patient ID: Kelly [name], female    DOB: [birth year]  Age: 58 y.o. MRN: [id number]  CC:  Chief Complaint  Patient presents with   Establish Care    Needs primary care just over all check up  Has had a lot of falls, legs give out a lot, some ED visits after the falls Patient has seizures    HPI Kelly [name],  58 year old female with anxiety, schizoaffective disorder, hypertension, and seizures who presents to establish care with PCP.  Mammogram results - showed no malignancy or changes - annual f/u for screening recommend..  She has a history of seizures, with the last episode occurring about three weeks ago at a physical therapy session. She experiences approximately 8 to 10 seizures annually and can often feel them coming on. Her seizures can last several hours, and she does not typically require hospitalization. She is currently on multiple medications for seizure management, including Depakote, Lamictal, and Vimpat. She experiences tremors, which she attributes to her seizure medications. Managed by Neuro at [facility], Dr. Malvin Johns.  She has a history of schizoaffective disorder and severe depression with psychotic tendencies. She was previously seeing a psychiatrist but stopped attending appointments. She received a text indicating a new appointment is scheduled through [facility]. She was previously on Seroquel, which she has discontinued, and is currently on risperidone 1.5mg  nightly  She has a history of hypertension and is she thinks she currently taking Norvasc, clonidine, and losartan for management - but unsure of actual dosing, confusion with reconciliation. She also has high cholesterol and has recently adopted a vegan diet to help manage it.  She uses a CPAP machine for sleep apnea,  which has improved her sleep quality. She also uses albuterol as needed for respiratory issues, primarily during allergy season.  She has a history of a heart attack, which she states occurred following a seizure. History of falls, for which she was attending physical therapy and orthopedics for - improving.   Has two sons, one lives in [place] and one in [place], who is a Naval architect. She is from [place] originally. She is single and lives alone. Two boys - [place] and [place].  Outpatient Encounter Medications as of 12/20/2023  Medication Sig   amLODipine (NORVASC) 5 MG tablet Take 1 tablet by mouth daily.   atorvastatin (LIPITOR) 40 MG tablet Take 40 mg by mouth at bedtime.   Cholecalciferol (VITAMIN D3) 50000 units CAPS TAKE ONE CAPSULE BY MOUTH ONE TIME PER WEEK   cloNIDine (CATAPRES) 0.1 MG tablet Take by mouth.   divalproex (DEPAKOTE) 500 MG DR tablet Take 500 mg by mouth 2 (two) times daily.   fluticasone (FLONASE) 50 MCG/ACT nasal spray Place 1 spray into both nostrils 2 (two) times daily.   gabapentin (NEURONTIN) 300 MG capsule PLEASE SEE ATTACHED FOR DETAILED DIRECTIONS (Patient not taking: Reported on 03/09/2023)   lamoTRIgine (LAMICTAL) 200 MG tablet Take 1 tablet (200 mg total) by mouth 2 (two) times daily.   losartan (COZAAR) 100 MG tablet Take 100 mg by mouth daily.   montelukast (SINGULAIR) 10 MG tablet Take 10 mg by mouth at bedtime.    nortriptyline (PAMELOR) 10 MG capsule Start Nortriptyline (Pamelor) 10 mg nightly for one week, then increase to 20 mg nightly   omeprazole (PRILOSEC) 40 MG capsule Take  40 mg by mouth daily.   predniSONE (DELTASONE) 50 MG tablet Take 1 pill daily for 5 days (Patient not taking: Reported on 03/09/2023)   predniSONE (STERAPRED UNI-PAK 21 TAB) 10 MG (21) TBPK tablet Per packaging instructions (Patient not taking: Reported on 03/09/2023)   VENTOLIN HFA 108 (90 Base) MCG/ACT inhaler Inhale 2 puffs into the lungs every 6 (six) hours as needed.   VIMPAT 200 MG TABS tablet  Take 200 mg by mouth 2 (two) times daily.    [DISCONTINUED] BREZTRI AEROSPHERE 160-9-4.8 MCG/ACT AERO Inhale 2 puffs into the lungs 2 (two) times daily.   [DISCONTINUED] QUEtiapine (SEROQUEL) 25 MG tablet Take 25 mg by mouth at bedtime.   [DISCONTINUED] VENTOLIN HFA 108 (90 Base) MCG/ACT inhaler Inhale 2 puffs into the lungs every 6 (six) hours as needed.   No facility-administered encounter medications on file as of 12/20/2023.    Past Medical History:  Diagnosis Date   Acid reflux    Anxiety    Depression    Hypercholesteremia    Hypertension    Myocardial infarction (HCC)    Schizophrenia (HCC)    Seizures (HCC)    Sleep apnea     Past Surgical History:  Procedure Laterality Date   CARDIAC CATHETERIZATION     CESAREAN SECTION     DILATION AND CURETTAGE OF UTERUS     FOOT SURGERY     TONSILLECTOMY      Family History  Problem Relation Age of Onset   Breast cancer Mother    Arthritis Mother    Diabetes Maternal Aunt    Ovarian cancer Neg Hx    Colon cancer Neg Hx     Social History   Socioeconomic History   Marital status: Single    Spouse name: Not on file   Number of children: Not on file   Years of education: Not on file   Highest education level: Not on file  Occupational History   Not on file  Tobacco Use   Smoking status: Some Days    Current packs/day: 0.00    Types: Cigarettes    Start date: 2002    Last attempt to quit: 2017    Years since quitting: 8.0   Smokeless tobacco: Former  Building services engineer status: Never Used  Substance and Sexual Activity   Alcohol use: Yes    Comment: rare   Drug use: Not Currently    Types: Marijuana    Comment: ocas   Sexual activity: Not Currently  Other Topics Concern   Not on file  Social History Narrative   Not on file   Social Drivers of Health   Financial Resource Strain: Not on file  Food Insecurity: Not on file  Transportation Needs: Not on file  Physical Activity: Not on file  Stress: Not on  file  Social Connections: Not on file  Intimate Partner Violence: Not on file    Review of Systems  All other systems reviewed and are negative.       Objective    BP 132/82 (BP Location: Left Arm, Patient Position: Sitting, Cuff Size: Large)   Pulse 90   Temp (!) 94.3 F (34.6 C)   Resp 16   Ht 5\' 5"  (1.651 m)   Wt 215 lb 11.2 oz (97.8 kg)   LMP  (LMP Unknown)   SpO2 96%   BMI 35.89 kg/m   Physical Exam Constitutional:      General: She is not in acute  distress.    Appearance: Normal appearance. She is obese. She is not toxic-appearing or diaphoretic.     Comments: Hair, unkempt  HENT:     Head: Normocephalic.     Nose: Nose normal.     Mouth/Throat:     Mouth: Mucous membranes are moist.     Pharynx: Oropharynx is clear.  Eyes:     General:        Right eye: No discharge.        Left eye: No discharge.     Extraocular Movements: Extraocular movements intact.     Pupils: Pupils are equal, round, and reactive to light.     Comments: Glasses present  Cardiovascular:     Rate and Rhythm: Normal rate and regular rhythm.     Pulses: Normal pulses.     Heart sounds: Normal heart sounds. No murmur heard.    No friction rub. No gallop.  Pulmonary:     Effort: No respiratory distress.     Breath sounds: No stridor. No wheezing, rhonchi or rales.  Chest:     Chest wall: No tenderness.  Musculoskeletal:     Right lower leg: No edema.     Left lower leg: No edema.  Skin:    General: Skin is warm and dry.     Capillary Refill: Capillary refill takes less than 2 seconds.  Neurological:     General: No focal deficit present.     Mental Status: She is alert and oriented to person, place, and time. Mental status is at baseline.     Sensory: No sensory deficit.     Motor: Tremor present.     Gait: Gait is intact.  Psychiatric:        Attention and Perception: Attention normal.        Mood and Affect: Mood and affect normal.        Speech: Speech is tangential.         Behavior: Behavior is slowed.        Thought Content: Thought content normal.        Cognition and Memory: Cognition normal. She exhibits impaired recent memory.        Judgment: Judgment normal.         Assessment & Plan:   Primary hypertension Assessment & Plan: Chronic, elevated during vist Goal <130/80 Pt unsure of what BP medications she is actually taking Confusing medication list brought to visit Per most recent neuro note - pt is taking losartan 25 BID, not what was listed on list pt brought, not what she mentioned during rooming Instructed pt to bring in all medication bottles to properly reconcile for pt safety Referral to [facility] for med mgmt assistance  Orders: -     Comprehensive metabolic panel  Mixed hyperlipidemia Assessment & Plan: Chronic Lipid panel checked Continue atorvastatin 40 mg daily - hx of MI  Orders: -     Lipid panel -     Hemoglobin A1c  Class 2 obesity without serious comorbidity with body mass index (BMI) of 35.0 to 35.9 in adult, unspecified obesity type Assessment & Plan: Encourage weight loss and as tolerated exercise  -check A1c, lipid, cmp, tsh  Orders: -     TSH -     Hemoglobin A1c  Occasional tremors Assessment & Plan: Per pt related to AEDs and hx of psych meds Neurp have been working with her on managing tremors   Encounter for hepatitis C screening test for low  risk patient -     Hepatitis C antibody  Screening for deficiency anemia -     CBC with Differential/Platelet  Mild intermittent asthma, unspecified whether complicated Assessment & Plan: Chronic, stable Has prn duonebs - no refill needed per pt Needed refill on prn albuterol inhaler Pt states does not like breztri aerosphere daily inhaler - she is not taking it too strong for her Clear breath sounds during visit, SP02 - 96% RA, no distress Tobacco cessation discussed, will to cut down Denies COPD hx, states asthma and says controlled during  visit Will plan to reassess at next visit   Orders: -     Ventolin HFA; Inhale 2 puffs into the lungs every 6 (six) hours as needed.  Dispense: 6.7 g; Refill: 11  Obstructive sleep apnea syndrome Assessment & Plan: Chronic, stable Nightly use of CPAP continue   Seizures (HCC) Assessment & Plan: Managed by Neurology through [facility], Dr. Malvin Johns typically experiences breakthrough seizures 8-10 per year - last 11/02/23 per neuro note Denies"aura" will just feel off prior to seizure, will lose consciousness Increased word finding Currently managed with Depakote 500 mg BID, Lamictal 200 mg am and 300mg  pm, and Vimpat 200mg  BID. Reports tremors, possibly medication side effect. -Continue current seizure medications.    Schizoaffective disorder, bipolar type [facility]) Assessment & Plan: Depression with psychosis Increased word finding issues, tangential conversation during visit Neurology has been managing, but recent referral to reestablish with Psych through [facility] Pt has planned appt on 12/23/23 via virtual - pt showed provider text message appt confirmation Per neuro note Risperdal 1.5 nightly Pt was on seroquel - she did not like how drowsy she got - stopped F/u 4 weeks for mood check and ensuring pt established with psych Concerns for pt not aware of actual medications she takes Referral to [facility] for assistance   Encounter to establish care with new doctor Assessment & Plan: Routine labs Pt not able to properly reconcile medication list with provider Instructed to f/u in 3-4 weeks for med rec.- bring all bottles presently taking Will refer to [facility] for assistance    Migraine without status migrainosus, not intractable, unspecified migraine type Assessment & Plan: Per review pt has hx of headaches Nortriptyline 40mg  nightly per Neuro note Neuro note also states gabapentin 300 mg during day and 900 mg at night for neck pain related to headaches.   Frequent  falls Assessment & Plan: Hx of frequent falls due to imbalance Improved since working with PT Recently saw ortho for knee injection, pain and balance improved Will reassess at next visit Ambulates without assistance, gait intake during visit   Medication regimen deficit Assessment & Plan: Pt unable to reconcile medications with provider today Concerning for lack of knowledge of what medications she is currently, especially pertaining to HTN medications and antipsychotic medications Referral to [facility] for assistance d/t complex hx Pt to f/u in 3-4 weeks and bring current medication bottles to visit for med rec   Will communicate lab results.  Return in about 4 weeks (around 01/17/2024) for medication check.   I, [name], FNP, have reviewed all documentation for this visit. The documentation on 12/20/23 for the exam, diagnosis, procedures, and orders are all accurate and complete.   [name], FNP

## 2023-12-20 NOTE — Assessment & Plan Note (Signed)
Chronic, stable Nightly use of CPAP continue

## 2023-12-20 NOTE — Assessment & Plan Note (Signed)
Pt unable to reconcile medications with provider today Concerning for lack of knowledge of what medications she is currently, especially pertaining to HTN medications and antipsychotic medications Referral to [facility] for assistance d/t complex hx Pt to f/u in 3-4 weeks and bring current medication bottles to visit for med rec

## 2023-12-20 NOTE — Assessment & Plan Note (Signed)
Encourage weight loss and as tolerated exercise  -check A1c, lipid, cmp, tsh

## 2023-12-20 NOTE — Assessment & Plan Note (Addendum)
Routine labs Pt not able to properly reconcile medication list with provider Instructed to f/u in 3-4 weeks for med rec.- bring all bottles presently taking UTD to PAP until 2027,  Mammo UTD - screening due 2026 Wears safety band for seizures during emergency response Will refer to [facility] for assistance

## 2023-12-21 LAB — COMPREHENSIVE METABOLIC PANEL
ALT: 13 [IU]/L (ref 0–32)
AST: 14 [IU]/L (ref 0–40)
Albumin: 4.1 g/dL (ref 3.8–4.9)
Alkaline Phosphatase: 104 [IU]/L (ref 44–121)
BUN/Creatinine Ratio: 21 (ref 9–23)
BUN: 17 mg/dL (ref 6–24)
Bilirubin Total: <0.2 mg/dL (ref 0.0–1.2)
CO2: 25 mmol/L (ref 20–29)
Calcium: 9.5 mg/dL (ref 8.7–10.2)
Chloride: 102 mmol/L (ref 96–106)
Creatinine, Ser: 0.81 mg/dL (ref 0.57–1.00)
Globulin, Total: 2.6 g/dL (ref 1.5–4.5)
Glucose: 74 mg/dL (ref 70–99)
Potassium: 4.1 mmol/L (ref 3.5–5.2)
Sodium: 140 mmol/L (ref 134–144)
Total Protein: 6.7 g/dL (ref 6.0–8.5)
eGFR: 86 mL/min/{1.73_m2} (ref >59)

## 2023-12-21 LAB — CBC WITH DIFFERENTIAL/PLATELET
Basophils Absolute: 0.1 10*3/uL (ref 0.0–0.2)
Basos: 1 %
EOS (ABSOLUTE): 0.2 10*3/uL (ref 0.0–0.4)
Eos: 2 %
Hematocrit: 35.8 % (ref 34.0–46.6)
Hemoglobin: 11.7 g/dL (ref 11.1–15.9)
Immature Grans (Abs): 0 10*3/uL (ref 0.0–0.1)
Immature Granulocytes: 0 %
Lymphocytes Absolute: 4.9 10*3/uL — ABNORMAL HIGH (ref 0.7–3.1)
Lymphs: 39 %
MCH: 27.3 pg (ref 26.6–33.0)
MCHC: 32.7 g/dL (ref 31.5–35.7)
MCV: 84 fL (ref 79–97)
Monocytes Absolute: 0.6 10*3/uL (ref 0.1–0.9)
Monocytes: 5 %
Neutrophils Absolute: 6.7 10*3/uL (ref 1.4–7.0)
Neutrophils: 53 %
Platelets: 261 10*3/uL (ref 150–450)
RBC: 4.28 x10E6/uL (ref 3.77–5.28)
RDW: 16.5 % — ABNORMAL HIGH (ref 11.7–15.4)
WBC: 12.5 10*3/uL — ABNORMAL HIGH (ref 3.4–10.8)

## 2023-12-21 LAB — LIPID PANEL
Chol/HDL Ratio: 3 {ratio} (ref 0.0–4.4)
Cholesterol, Total: 172 mg/dL (ref 100–199)
HDL: 57 mg/dL (ref >39)
LDL Chol Calc (NIH): 95 mg/dL (ref 0–99)
Triglycerides: 114 mg/dL (ref 0–149)
VLDL Cholesterol Cal: 20 mg/dL (ref 5–40)

## 2023-12-21 LAB — HEPATITIS C ANTIBODY: Hep C Virus Ab: NONREACTIVE

## 2023-12-21 LAB — HEMOGLOBIN A1C
Est. average glucose Bld gHb Est-mCnc: 108 mg/dL
Hgb A1c MFr Bld: 5.4 % (ref 4.8–5.6)

## 2023-12-21 LAB — TSH: TSH: 1.01 u[IU]/mL (ref 0.450–4.500)

## 2023-12-21 NOTE — Progress Notes (Signed)
Please call pt - no mychart access  Lipid panel - normal values, goal cholesterol<200, LDL<100 - continue taking Liptor 40mg  daily for decrease risk of CVD event - Given hx of MI pt should continue this medication to prevent any future risk for stroke or heart attack. TSH - normal values A1C - normal values, no prediabetes or diabetes level CBC - chronic elevation in WBC, RDW, and lymphocytes - otherwise normal values - will discuss at next visit. CMP - normal kidney, liver, and electrolyte function Hep C negative

## 2023-12-22 ENCOUNTER — Telehealth: Payer: Self-pay | Admitting: *Deleted

## 2023-12-22 NOTE — Progress Notes (Signed)
 Complex Care Management Note Care Guide Note  12/22/2023 Name: Kelly [name] MRN: [id number] DOB: [birth year]   Complex Care Management Outreach Attempts: An unsuccessful telephone outreach was attempted today to offer the patient information about available complex care management services.  Follow Up Plan:  Additional outreach attempts will be made to offer the patient complex care management information and services.   Encounter Outcome:  No Answer  Thedford Franks, CMA, Care Guide [facility]  [facility], [facility] Guide Direct Dial: [phone]  Fax: [phone] Website: [email or web address]

## 2023-12-23 ENCOUNTER — Other Ambulatory Visit: Payer: Self-pay

## 2023-12-23 ENCOUNTER — Emergency Department: Payer: 59

## 2023-12-23 ENCOUNTER — Telehealth: Payer: Self-pay

## 2023-12-23 ENCOUNTER — Encounter: Payer: Self-pay | Admitting: Emergency Medicine

## 2023-12-23 ENCOUNTER — Emergency Department
Admission: EM | Admit: 2023-12-23 | Discharge: 2023-12-23 | Disposition: A | Discharge status: Home / Self Care | Payer: 59 | Attending: Emergency Medicine | Admitting: Emergency Medicine

## 2023-12-23 DIAGNOSIS — I10 Essential (primary) hypertension: Secondary | ICD-10-CM | POA: Diagnosis not present

## 2023-12-23 DIAGNOSIS — R569 Unspecified convulsions: Secondary | ICD-10-CM | POA: Diagnosis not present

## 2023-12-23 LAB — VALPROIC ACID LEVEL: Valproic Acid Lvl: 71 ug/mL (ref 50.0–100.0)

## 2023-12-23 LAB — COMPREHENSIVE METABOLIC PANEL
ALT: 18 U/L (ref 0–44)
AST: 18 U/L (ref 15–41)
Albumin: 3.7 g/dL (ref 3.5–5.0)
Alkaline Phosphatase: 74 U/L (ref 38–126)
Anion gap: 13 (ref 5–15)
BUN: 22 mg/dL — ABNORMAL HIGH (ref 6–20)
CO2: 24 mmol/L (ref 22–32)
Calcium: 8.9 mg/dL (ref 8.9–10.3)
Chloride: 101 mmol/L (ref 98–111)
Creatinine, Ser: 0.92 mg/dL (ref 0.44–1.00)
GFR, Estimated: >60 mL/min (ref >60)
Glucose, Bld: 86 mg/dL (ref 70–99)
Potassium: 4.1 mmol/L (ref 3.5–5.1)
Sodium: 138 mmol/L (ref 135–145)
Total Bilirubin: 0.4 mg/dL (ref 0.0–1.2)
Total Protein: 7.4 g/dL (ref 6.5–8.1)

## 2023-12-23 LAB — ACETAMINOPHEN LEVEL: Acetaminophen (Tylenol), Serum: <10 ug/mL — ABNORMAL LOW (ref 10–30)

## 2023-12-23 LAB — CBC WITH DIFFERENTIAL/PLATELET
Abs Immature Granulocytes: 0.05 10*3/uL (ref 0.00–0.07)
Basophils Absolute: 0.1 10*3/uL (ref 0.0–0.1)
Basophils Relative: 0 %
Eosinophils Absolute: 0.3 10*3/uL (ref 0.0–0.5)
Eosinophils Relative: 2 %
HCT: 35.7 % — ABNORMAL LOW (ref 36.0–46.0)
Hemoglobin: 11.4 g/dL — ABNORMAL LOW (ref 12.0–15.0)
Immature Granulocytes: 0 %
Lymphocytes Relative: 37 %
Lymphs Abs: 4.2 10*3/uL — ABNORMAL HIGH (ref 0.7–4.0)
MCH: 26.8 pg (ref 26.0–34.0)
MCHC: 31.9 g/dL (ref 30.0–36.0)
MCV: 84 fL (ref 80.0–100.0)
Monocytes Absolute: 0.6 10*3/uL (ref 0.1–1.0)
Monocytes Relative: 5 %
Neutro Abs: 6.2 10*3/uL (ref 1.7–7.7)
Neutrophils Relative %: 56 %
Platelets: 292 10*3/uL (ref 150–400)
RBC: 4.25 MIL/uL (ref 3.87–5.11)
RDW: 17.2 % — ABNORMAL HIGH (ref 11.5–15.5)
WBC: 11.4 10*3/uL — ABNORMAL HIGH (ref 4.0–10.5)
nRBC: 0 % (ref 0.0–0.2)

## 2023-12-23 LAB — CBG MONITORING, ED: Glucose-Capillary: 88 mg/dL (ref 70–99)

## 2023-12-23 MED ORDER — ACETAMINOPHEN 325 MG PO TABS
650.0000 mg | ORAL_TABLET | Freq: Once | ORAL | Status: AC
  Administered 2023-12-23: 650 mg via ORAL
  Filled 2023-12-23: qty 2

## 2023-12-23 MED ORDER — ONDANSETRON 4 MG PO TBDP
4.0000 mg | ORAL_TABLET | Freq: Once | ORAL | Status: AC
  Administered 2023-12-23: 4 mg via ORAL
  Filled 2023-12-23: qty 1

## 2023-12-23 NOTE — Discharge Instructions (Signed)
 Your exam labs, EKG, and CT scan are normal reassuring at this time.  No signs of ongoing seizure activity.  You seem to be at baseline at this time, and stable for discharge home.  Continue to take your home seizure medicines as directed.  Follow-up primary provider for ongoing evaluation.  Return to the ED if needed.

## 2023-12-23 NOTE — ED Provider Notes (Signed)
 [facility] Emergency Department Provider Note     Event Date/Time   First MD Initiated Contact with Patient 12/23/23 1611     (approximate)   History   Seizures   HPI  Kelly [name] is a 58 y.o. female with a history of seizure disorder, schizophrenia, HTN, HLD, anxiety, L-sided weakness, who presents to the ED via EMS.  Patient was at her neighbor's home where she allegedly had some unwitnessed seizure activity.  The neighbor only found the patient on the floor for an unknown period of time.  According to EMS, the patient does appear postictal for the duration of her course.  She is alert at this time and able to answer questions     Physical Exam   Triage Vital Signs: ED Triage Vitals [12/23/23 1606]  Encounter Vitals Group     BP      Systolic BP Percentile      Diastolic BP Percentile      Pulse      Resp      Temp      Temp src      SpO2      Weight 216 lb 0.8 oz (98 kg)     Height 5' 5 (1.651 m)     Head Circumference      Peak Flow      Pain Score      Pain Loc      Pain Education      Exclude from Growth Chart     Most recent vital signs: Vitals:   12/23/23 2130 12/23/23 2200  BP: 117/73 132/69  Pulse: 90 91  Resp: 15 13  Temp:    SpO2: 96% 97%    General Awake, no distress. NAD HEENT NCAT. PERRL. EOMI. No rhinorrhea. Mucous membranes are moist.  CV:  Good peripheral perfusion. RRR RESP:  Normal effort. CTA ABD:  No distention.  NEURO: Cranial nerves II to XII grossly intact.   ED Results / Procedures / Treatments   Labs (all labs ordered are listed, but only abnormal results are displayed) Labs Reviewed  COMPREHENSIVE METABOLIC PANEL - Abnormal; Notable for the following components:      Result Value   BUN 22 (*)    All other components within normal limits  CBC WITH DIFFERENTIAL/PLATELET - Abnormal; Notable for the following components:   WBC 11.4 (*)    Hemoglobin 11.4 (*)    HCT 35.7 (*)    RDW 17.2  (*)    Lymphs Abs 4.2 (*)    All other components within normal limits  ACETAMINOPHEN  LEVEL - Abnormal; Notable for the following components:   Acetaminophen  (Tylenol ), Serum <10 (*)    All other components within normal limits  VALPROIC  ACID LEVEL  LAMOTRIGINE  LEVEL  CBG MONITORING, ED   EKG  Vent. rate 90 BPM PR interval 206 ms QRS duration 96 ms QT/QTcB 360/440 ms P-R-T axes 76 84 70 Normal sinus rhythm Biatrial enlargement Abnormal ECG  RADIOLOGY  I personally viewed and evaluated these images as part of my medical decision making, as well as reviewing the written report by the radiologist.  ED Provider Interpretation: No acute findings   CT Head w/o CM  IMPRESSION: No evidence of acute intracranial abnormality.   PROCEDURES:  Critical Care performed: No  Procedures   MEDICATIONS ORDERED IN ED: Medications  acetaminophen  (TYLENOL ) tablet 650 mg (650 mg Oral Given 12/23/23 2018)  ondansetron  (ZOFRAN -ODT) disintegrating tablet 4 mg (4 mg  Oral Given 12/23/23 2017)     IMPRESSION / MDM / ASSESSMENT AND PLAN / ED COURSE  I reviewed the triage vital signs and the nursing notes.                              Differential diagnosis includes, but is not limited to, alcohol, illicit or prescription medications, or other toxic ingestion; intracranial pathology such as stroke or intracerebral hemorrhage; fever or infectious causes including sepsis; hypoxemia and/or hypercarbia; uremia; trauma; endocrine related disorders such as diabetes, hypoglycemia, and thyroid -related diseases; hypertensive encephalopathy; etc.  Patient's presentation is most consistent with acute complicated illness / injury requiring diagnostic workup.  Patient's diagnosis is consistent with seizure-like activity.  Patient with history of epileptic seizure, presents in a postictal state after she was found on the floor of her neighbors home.  EMS reports that the neighbor did not actually witness the  patient have a seizure activity.  Patient has been stable throughout her course in the ED.  Labs overall reassuring without any acute abnormalities.  She appears to be therapeutic on her antiseizure therapy.  Patient without any seizure activity in the ED.  CT scan without evidence of an acute intracranial process.  At the time of this interval evaluation, she is able to communicate in complete fluid sentences and request discharge home.  She does not endorse any current pain.  She been given Tylenol  as well as nausea medicine and had normal p.o. intake at this time.  Patient will be discharged home with instructions to take her home meds as prescribed. Patient is to follow up with primary provider as discussed, as needed or otherwise directed. Patient is given ED precautions to return to the ED for any worsening or new symptoms.  FINAL CLINICAL IMPRESSION(S) / ED DIAGNOSES   Final diagnoses:  Seizure-like activity (HCC)     Rx / DC Orders   ED Discharge Orders     None        Note:  This document was prepared using Dragon voice recognition software and may include unintentional dictation errors.    Loyd Candida LULLA Aldona, PA-C 12/24/23 2318    Dicky Anes, MD 12/24/23 2330

## 2023-12-23 NOTE — Progress Notes (Signed)
 Care Guide Pharmacy Note  12/23/2023 Name: Kelly [name] MRN: [id number] DOB: 08-Sep-1966  Referred By: Wellington Curtis LABOR, FNP Reason for referral: Care Coordination (Outreach to schedule with Pharm d )   Kelly [name] is a 58 y.o. year old female who is a primary care patient of Wellington Curtis LABOR, FNP.  Kelly [name] was referred to the pharmacist for assistance related to: HTN  Successful contact was made with the patient to discuss pharmacy services including being ready for the pharmacist to call at least 5 minutes before the scheduled appointment time and to have medication bottles and any blood pressure readings ready for review. The patient agreed to meet with the pharmacist via telephone visit on (date/time).01/06/2024  Jeoffrey Buffalo , RMA     [place]  [facility], [place] Guide  Direct Dial: [phone]  Website: [email or web address]

## 2023-12-23 NOTE — Progress Notes (Signed)
 Complex Care Management Note  Care Guide Note 12/23/2023 Name: Kelly [name] MRN: [id number] DOB: [birth year]  Kelly [name] is a 58 y.o. year old female who sees Wellington Curtis LABOR, FNP for primary care. I reached out to Garrie Remington by phone today to offer complex care management services.  Kelly [name] was given information about Complex Care Management services today including:   The Complex Care Management services include support from the care team which includes your Nurse Care Manager, Clinical Social Worker, or Pharmacist.  The Complex Care Management team is here to help remove barriers to the health concerns and goals most important to you. Complex Care Management services are voluntary, and the patient may decline or stop services at any time by request to their care team member.   Complex Care Management Consent Status: Patient agreed to services and verbal consent obtained.   Follow up plan:  Telephone appointment with complex care management team member scheduled for:  12/31/2023 and 01/03/2024  Encounter Outcome:  Patient Scheduled  Thedford Franks, CMA, Care Guide [facility]  [facility], [facility] Guide Direct Dial: [phone]  Fax: [phone] Website: [email or web address]

## 2023-12-23 NOTE — ED Triage Notes (Addendum)
 Pt brought in by [facility] from friend's home for seizure like activity. Unwitnessed seizure, but was found in floor by neighbor. Has been post-ictal since EMS arrival. Denies pain at this time.   CBG 99

## 2023-12-23 NOTE — ED Notes (Signed)
 The pt's blood sugar was assessed. The pt was given apple juice to drink.

## 2023-12-28 LAB — LAMOTRIGINE LEVEL: Lamotrigine Lvl: 15.4 ug/mL (ref 2.0–20.0)

## 2023-12-31 ENCOUNTER — Ambulatory Visit: Payer: Self-pay | Admitting: *Deleted

## 2023-12-31 NOTE — Patient Instructions (Addendum)
Visit Information  Thank you for taking time to visit with me today. Please don't hesitate to contact me if I can be of assistance to you.   Following are the goals we discussed today:   Goals Addressed             This Visit's Progress    mental health follow up       Activities and task to complete in order to accomplish goals.   EMOTIONAL / MENTAL HEALTH SUPPORT Keep all upcoming appointment discussed today Self Support options  (CSW to contact [facility] to discuss re-establishing care for medication management and ongoing mental health counseling)         Our next appointment is by telephone on 01/04/24 at 1pm  Please call the care guide team at [phone] if you need to cancel or reschedule your appointment.   If you are experiencing a Mental Health or Behavioral Health Crisis or need someone to talk to, please call the Suicide and Crisis Lifeline: [phone]   Patient verbalizes understanding of instructions and care plan provided today and agrees to view in MyChart. Active MyChart status and patient understanding of how to access instructions and care plan via MyChart confirmed with patient.     Telephone follow up appointment with care management team member scheduled for: 01/04/24  Verna Czech, LCSW [place]  [facility], [facility] Licensed Clinical Social Worker Care Coordinator  Direct Dial: [phone]      \

## 2023-12-31 NOTE — Patient Outreach (Addendum)
  Care Coordination   Initial Visit Note   01/04/2024 Name: Kelly [name] MRN: [id number] DOB: 20-Mar-1966  Kelly [name] is a 58 y.o. year old female who sees [name], FNP for primary care. I spoke with  Nancy Nordmann by phone today.  What matters to the patients health and wellness today?  Ongoing mental health follow up    Goals Addressed             This Visit's Progress    mental health follow up       Activities and task to complete in order to accomplish goals.   EMOTIONAL / MENTAL HEALTH SUPPORT Keep all upcoming appointment discussed today Self Support options  (CSW to contact [facility] to discuss re-establishing care for medication management and ongoing mental health counseling)         SDOH assessments and interventions completed:  Yes  SDOH Interventions Today    Flowsheet Row Most Recent Value  SDOH Interventions   Food Insecurity Interventions Intervention Not Indicated  Housing Interventions Intervention Not Indicated  Transportation Interventions Intervention Not Indicated  [uses ACTA]  Depression Interventions/Treatment  Medication, Referral to Psychiatry  [patient states that her neurologist has been prescribing her medication]        Care Coordination Interventions:  Yes, provided  Interventions Today    Flowsheet Row Most Recent Value  Chronic Disease   Chronic disease during today's visit Other  [schizoaffective disorder, seizure disorder]  General Interventions   General Interventions Discussed/Reviewed General Interventions Discussed, 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 Patient states that she lives alone, is currently not active with a mental health provider, agreeable to referral for mental health follow up and medication management]  Mental Health Interventions   Mental Health Discussed/Reviewed Mental Health Discussed, Depression, Mental Health Reviewed  [Confirmed syptoms of  depression, feelings of hopelessness, reports long history of suicide ideation, confirms having no plan, agreeable to ongoing mental health folllow up through [facility]]  Safety Interventions   Safety Discussed/Reviewed Safety Discussed  [Encouraged patient to contact [phone] in the event of a mental health crisis]       Follow up plan: Follow up call scheduled for 01/04/24    Encounter Outcome:  Patient Visit Completed

## 2024-01-03 ENCOUNTER — Other Ambulatory Visit: Payer: Self-pay

## 2024-01-03 NOTE — Patient Instructions (Signed)
 Thank you for allowing the Care Management team to participate in your care.  It was great speaking with you today!

## 2024-01-03 NOTE — Patient Outreach (Unsigned)
  Care Management   Visit Note  01/03/2024 Name: Kelly [name] MRN: [id number] DOB: December 07, 1965  Subjective: Kelly [name] is a 58 y.o. year old female who is a primary care patient of Kelly Provencal, FNP. The Care Management team was consulted for assistance.      Engaged with [name] via telephone today.  Assessment:  Review of patient past medical history, allergies, medications, health status, including review of consultants reports, laboratory and other test data, was performed as part of  evaluation and provision of care management services.    Outpatient Encounter Medications as of 01/03/2024  Medication Sig   VENTOLIN HFA 108 (90 Base) MCG/ACT inhaler Inhale 2 puffs into the lungs every 6 (six) hours as needed.   amLODipine (NORVASC) 5 MG tablet Take 1 tablet by mouth daily.   atorvastatin (LIPITOR) 40 MG tablet Take 40 mg by mouth at bedtime.   Cholecalciferol (VITAMIN D3) 50000 units CAPS TAKE ONE CAPSULE BY MOUTH ONE TIME PER WEEK   cloNIDine (CATAPRES) 0.1 MG tablet Take by mouth.   divalproex (DEPAKOTE) 500 MG DR tablet Take 500 mg by mouth 2 (two) times daily.   fluticasone (FLONASE) 50 MCG/ACT nasal spray Place 1 spray into both nostrils 2 (two) times daily.   gabapentin (NEURONTIN) 300 MG capsule PLEASE SEE ATTACHED FOR DETAILED DIRECTIONS (Patient not taking: Reported on 03/09/2023)   lamoTRIgine (LAMICTAL) 200 MG tablet Take 1 tablet (200 mg total) by mouth 2 (two) times daily. (Patient taking differently: Take 200 mg by mouth 2 (two) times daily. Taking 300mg  HS)   losartan (COZAAR) 100 MG tablet Take 100 mg by mouth daily.   montelukast (SINGULAIR) 10 MG tablet Take 10 mg by mouth at bedtime.    nortriptyline (PAMELOR) 10 MG capsule Take 40 mg by mouth at bedtime.   omeprazole (PRILOSEC) 40 MG capsule Take 40 mg by mouth daily.   risperiDONE (RISPERDAL) 0.5 MG tablet Take 1.5 mg by mouth at bedtime.   VIMPAT 200 MG TABS tablet Take 200 mg by mouth 2 (two) times  daily.    No facility-administered encounter medications on file as of 01/03/2024.    Interventions:

## 2024-01-04 ENCOUNTER — Ambulatory Visit: Payer: Self-pay | Admitting: *Deleted

## 2024-01-04 NOTE — Patient Outreach (Signed)
  Care Coordination   Follow Up Visit Note   01/04/2024 Name: Kelly [name] MRN: [id number] DOB: [birth year]  Kelly [name] is a 58 y.o. year old female who sees [name], FNP for primary care. I spoke with  Nancy Nordmann by phone today.  What matters to the patients health and wellness today?  Mental health follow up for ongoing counseling and medication management.    Goals Addressed             This Visit's Progress    mental health follow up       Activities and task to complete in order to accomplish goals.   EMOTIONAL / MENTAL HEALTH SUPPORT Keep all upcoming appointment discussed today Self Support options  (Please present to [facility] for assessment to re-establish care for medication management and ongoing mental health counseling- M-F 8am-2pm  [address] [place] [address] [phone])         SDOH assessments and interventions completed:  No     Care Coordination Interventions:  Yes, provided  Interventions Today    Flowsheet Row Most Recent Value  Chronic Disease   Chronic disease during today's visit Other  [schizoaffective disorder, seizure disorder]  General Interventions   General Interventions Discussed/Reviewed General Interventions Discussed, [employer], Communication with  [Continued assessment of patients for community resource/.mental health needs, Patient agreeable to follow up with [facility], reports finding benefits in MH follow when she was active-pt requesting resources for transportation to appts outside of med appts]  Communication with --  [[facility] contacted-confirmed that patient has been discharged and will need to be re-assessed. Patient can walk-in for assessment M-F from 8am-2pm]  Mental Health Interventions   Mental Health Discussed/Reviewed Mental Health Reviewed, Coping Strategies, Depression, Other  [Pt continues to be assessed for MH needs, pt confirms increased agitation due to  frequency of calls from [facility] team but  understands need for close f/u. Agreeable to assessment at [facility] to resume MH treatment. Emotional support/Positive reinforcement provided]       Follow up plan: Follow up call scheduled for 01/18/24    Encounter Outcome:  Patient Visit Completed

## 2024-01-04 NOTE — Patient Instructions (Signed)
Visit Information  Thank you for taking time to visit with me today. Please don't hesitate to contact me if I can be of assistance to you.   Following are the goals we discussed today:   Goals Addressed             This Visit's Progress    mental health follow up       Activities and task to complete in order to accomplish goals.   EMOTIONAL / MENTAL HEALTH SUPPORT Keep all upcoming appointment discussed today Self Support options  (Please present to [facility] for assessment to re-establish care for medication management and ongoing mental health counseling- M-F 8am-2pm  [address] [place] [address] [phone])         Our next appointment is by telephone on 01/18/24 at 2pm  Please call the care guide team at [phone] if you need to cancel or reschedule your appointment.   If you are experiencing a Mental Health or Behavioral Health Crisis or need someone to talk to, please call the Suicide and Crisis Lifeline: 988   Patient verbalizes understanding of instructions and care plan provided today and agrees to view in MyChart. Active MyChart status and patient understanding of how to access instructions and care plan via MyChart confirmed with patient.     Telephone follow up appointment with care management team member scheduled for: 01/18/24  Verna Czech, LCSW [place]  [facility], [facility] Licensed Clinical Social Worker Care Coordinator  Direct Dial: [phone]

## 2024-01-06 ENCOUNTER — Telehealth: Payer: Self-pay | Admitting: Pharmacist

## 2024-01-06 ENCOUNTER — Other Ambulatory Visit: Payer: Self-pay | Admitting: Pharmacist

## 2024-01-06 NOTE — Progress Notes (Signed)
   01/06/2024  Patient ID: Kelly [name], female   DOB: 03-Dec-1965, 58 y.o.   MRN: [id number]  Called patient regarding medication review visit scheduled today. Unable to reach at this time after several attempts. Left voicemail requesting call back at earliest convenience.  Marlowe Aschoff, PharmD [facility] Phone Number: [phone]

## 2024-01-12 ENCOUNTER — Ambulatory Visit: Payer: 59

## 2024-01-12 VITALS — Ht 65.0 in | Wt 212.0 lb

## 2024-01-12 DIAGNOSIS — Z0000 Encounter for general adult medical examination without abnormal findings: Secondary | ICD-10-CM | POA: Diagnosis not present

## 2024-01-12 NOTE — Patient Instructions (Addendum)
 Ms. Tumlin , Thank you for taking time to come for your Medicare Wellness Visit. I appreciate your ongoing commitment to your health goals. Please review the following plan we discussed and let me know if I can assist you in the future.   Referrals/Orders/Follow-Ups/Clinician Recommendations: Get the shingles, covid and tetanus vaccines at your convenience.  This is a list of the screening recommended for you and due dates:  Health Maintenance  Topic Date Due   COVID-19 Vaccine (1) Never done   Pneumococcal Vaccination (1 of 2 - PCV) Never done   DTaP/Tdap/Td vaccine (1 - Tdap) Never done   Zoster (Shingles) Vaccine (1 of 2) Never done   Pap with HPV screening  Never done   Colon Cancer Screening  Never done   Flu Shot  06/16/2024*   Mammogram  11/24/2024   Medicare Annual Wellness Visit  01/11/2025   Hepatitis C Screening  Completed   HIV Screening  Completed   HPV Vaccine  Aged Out  *Topic was postponed. The date shown is not the original due date.    Advanced directives: (ACP Link)Information on Advanced Care Planning can be found at [facility] of [place] Advance Health Care Directives Advance Health Care Directives ([email or web address]/) You may also get these forms at your doctor's office.  Next Medicare Annual Wellness Visit scheduled for next year: Yes, 01/17/25 @ 3:50pm (phone visit)  Fall Prevention in the Home, Adult Falls can cause injuries and affect people of all ages. There are many simple things that you can do to make your home safe and to help prevent falls. If you need it, ask for help making these changes. What actions can I take to prevent falls? General information Use good lighting in all rooms. Make sure to: Replace any light bulbs that burn out. Turn on lights if it is dark and use night-lights. Keep items that you use often in easy-to-reach places. Lower the shelves around your home if needed. Move furniture so that there are clear paths around it. Do not  keep throw rugs or other things on the floor that can make you trip. If any of your floors are uneven, fix them. Add color or contrast paint or tape to clearly mark and help you see: Grab bars or handrails. First and last steps of staircases. Where the edge of each step is. If you use a ladder or stepladder: Make sure that it is fully opened. Do not climb a closed ladder. Make sure the sides of the ladder are locked in place. Have someone hold the ladder while you use it. Know where your pets are as you move through your home. What can I do in the bathroom?     Keep the floor dry. Clean up any water that is on the floor right away. Remove soap buildup in the bathtub or shower. Buildup makes bathtubs and showers slippery. Use non-skid mats or decals on the floor of the bathtub or shower. Attach bath mats securely with double-sided, non-slip rug tape. If you need to sit down while you are in the shower, use a non-slip stool. Install grab bars by the toilet and in the bathtub and shower. Do not use towel bars as grab bars. What can I do in the bedroom? Make sure that you have a light by your bed that is easy to reach. Do not use any sheets or blankets on your bed that hang to the floor. Have a firm bench or chair with side arms  that you can use for support when you get dressed. What can I do in the kitchen? Clean up any spills right away. If you need to reach something above you, use a sturdy step stool that has a grab bar. Keep electrical cables out of the way. Do not use floor polish or wax that makes floors slippery. What can I do with my stairs? Do not leave anything on the stairs. Make sure that you have a light switch at the top and the bottom of the stairs. Have them installed if you do not have them. Make sure that there are handrails on both sides of the stairs. Fix handrails that are broken or loose. Make sure that handrails are as long as the staircases. Install non-slip  stair treads on all stairs in your home if they do not have carpet. Avoid having throw rugs at the top or bottom of stairs, or secure the rugs with carpet tape to prevent them from moving. Choose a carpet design that does not hide the edge of steps on the stairs. Make sure that carpet is firmly attached to the stairs. Fix any carpet that is loose or worn. What can I do on the outside of my home? Use bright outdoor lighting. Repair the edges of walkways and driveways and fix any cracks. Clear paths of anything that can make you trip, such as tools or rocks. Add color or contrast paint or tape to clearly mark and help you see high doorway thresholds. Trim any bushes or trees on the main path into your home. Check that handrails are securely fastened and in good repair. Both sides of all steps should have handrails. Install guardrails along the edges of any raised decks or porches. Have leaves, snow, and ice cleared regularly. Use sand, salt, or ice melt on walkways during winter months if you live where there is ice and snow. In the garage, clean up any spills right away, including grease or oil spills. What other actions can I take? Review your medicines with your health care provider. Some medicines can make you confused or feel dizzy. This can increase your chance of falling. Wear closed-toe shoes that fit well and support your feet. Wear shoes that have rubber soles and low heels. Use a cane, walker, scooter, or crutches that help you move around if needed. Talk with your provider about other ways that you can decrease your risk of falls. This may include seeing a physical therapist to learn to do exercises to improve movement and strength. Where to find more information Centers for Disease Control and Prevention, [employer]AD[employer]: TonerPromos.no [employer] on Aging: BaseRingTones.pl National Institute on Aging: BaseRingTones.pl Contact a health care provider if: You are afraid of falling at home. You feel  weak, drowsy, or dizzy at home. You fall at home. Get help right away if you: Lose consciousness or have trouble moving after a fall. Have a fall that causes a head injury. These symptoms may be an emergency. Get help right away. Call 911. Do not wait to see if the symptoms will go away. Do not drive yourself to the hospital. This information is not intended to replace advice given to you by your health care provider. Make sure you discuss any questions you have with your health care provider. Document Revised: 07/06/2022 Document Reviewed: 07/06/2022 Elsevier Patient Education  2024 [employer].

## 2024-01-12 NOTE — Progress Notes (Signed)
 Subjective:   Kelly [name] is a 58 y.o. who presents for a Medicare Wellness preventive visit.  Visit Complete: Virtual I connected with  Kelly [name] on 01/12/24 by a audio enabled telemedicine application and verified that I am speaking with the correct person using two identifiers.  Patient Location: Home  Provider Location: Home Office  I discussed the limitations of evaluation and management by telemedicine. The patient expressed understanding and agreed to proceed.  Vital Signs: Because this visit was a virtual/telehealth visit, some criteria may be missing or patient reported. Any vitals not documented were not able to be obtained and vitals that have been documented are patient reported.  VideoDeclined- This patient declined Librarian, academic. Therefore the visit was completed with audio only.  AWV Questionnaire: No: Patient Medicare AWV questionnaire was not completed prior to this visit.  Cardiac Risk Factors include: hypertension;dyslipidemia;obesity (BMI >30kg/m2);smoking/ tobacco exposure;Other (see comment), Risk factor comments: OSA (cpap)     Objective:    Today's Vitals   01/12/24 1422 01/12/24 1423  Weight: 212 lb (96.2 kg)   Height: 5\' 5"  (1.651 m)   PainSc:  0-No pain   Body mass index is 35.28 kg/m.     01/12/2024    2:50 PM 11/02/2023    1:41 PM 03/09/2023    1:47 AM 02/12/2022   11:16 AM 12/20/2021    1:10 PM 11/26/2021    2:32 PM 04/29/2021    1:15 PM  Advanced Directives  Does Patient Have a Medical Advance Directive? Yes No No No Unable to assess, patient is non-responsive or altered mental status No No  Does patient want to make changes to medical advance directive? Yes (MAU/Ambulatory/Procedural Areas - Information given)        Would patient like information on creating a medical advance directive?  No - Patient declined No - Patient declined    No - Patient declined    Current Medications (verified) Outpatient  Encounter Medications as of 01/12/2024  Medication Sig   amLODipine (NORVASC) 5 MG tablet Take 1 tablet by mouth daily.   atorvastatin (LIPITOR) 40 MG tablet Take 40 mg by mouth at bedtime.   cloNIDine (CATAPRES) 0.1 MG tablet Take by mouth.   divalproex (DEPAKOTE) 500 MG DR tablet Take 500 mg by mouth 2 (two) times daily.   gabapentin (NEURONTIN) 300 MG capsule    lamoTRIgine (LAMICTAL) 200 MG tablet Take 1 tablet (200 mg total) by mouth 2 (two) times daily. (Patient taking differently: Take 200 mg by mouth 2 (two) times daily. Taking 300mg  HS)   losartan (COZAAR) 100 MG tablet Take 100 mg by mouth daily.   montelukast (SINGULAIR) 10 MG tablet Take 10 mg by mouth at bedtime.    nortriptyline (PAMELOR) 10 MG capsule Take 40 mg by mouth at bedtime.   omeprazole (PRILOSEC) 40 MG capsule Take 40 mg by mouth daily.   VENTOLIN HFA 108 (90 Base) MCG/ACT inhaler Inhale 2 puffs into the lungs every 6 (six) hours as needed.   VIMPAT 200 MG TABS tablet Take 200 mg by mouth 2 (two) times daily.    Cholecalciferol (VITAMIN D3) 50000 units CAPS TAKE ONE CAPSULE BY MOUTH ONE TIME PER WEEK (Patient not taking: Reported on 01/12/2024)   fluticasone (FLONASE) 50 MCG/ACT nasal spray Place 1 spray into both nostrils 2 (two) times daily. (Patient not taking: Reported on 01/12/2024)   risperiDONE (RISPERDAL) 0.5 MG tablet Take 1.5 mg by mouth at bedtime. (Patient not taking: Reported on  01/12/2024)   No facility-administered encounter medications on file as of 01/12/2024.    Allergies (verified) Percocet [oxycodone-acetaminophen]   History: Past Medical History:  Diagnosis Date   Acid reflux    Anxiety    Depression    Hypercholesteremia    Hypertension    Myocardial infarction (HCC)    Schizophrenia (HCC)    Seizures (HCC)    Sleep apnea    Past Surgical History:  Procedure Laterality Date   CARDIAC CATHETERIZATION     CESAREAN SECTION     DILATION AND CURETTAGE OF UTERUS     FOOT SURGERY      TONSILLECTOMY     Family History  Problem Relation Age of Onset   Breast cancer Mother    Arthritis Mother    Other Father        "old age"   Diabetes Maternal Aunt    Ovarian cancer Neg Hx    Colon cancer Neg Hx    Social History   Socioeconomic History   Marital status: Single    Spouse name: Not on file   Number of children: 2   Years of education: Not on file   Highest education level: Not on file  Occupational History   Not on file  Tobacco Use   Smoking status: Some Days    Current packs/day: 0.00    Types: Cigarettes    Start date: 2002    Last attempt to quit: 2017    Years since quitting: 8.1   Smokeless tobacco: Former   Tobacco comments:    01/12/24 Patient refused to answer smoking status  Vaping Use   Vaping status: Never Used  Substance and Sexual Activity   Alcohol use: Yes    Comment: rare, less than monthly   Drug use: Not Currently    Types: Marijuana    Comment: ocas   Sexual activity: Not Currently  Other Topics Concern   Not on file  Social History Narrative   Not on file   Social Drivers of Health   Financial Resource Strain: Low Risk  (01/12/2024)   Overall Financial Resource Strain (CARDIA)    Difficulty of Paying Living Expenses: Not hard at all  Food Insecurity: No Food Insecurity (01/12/2024)   Hunger Vital Sign    Worried About Running Out of Food in the Last Year: Never true    Ran Out of Food in the Last Year: Never true  Transportation Needs: No Transportation Needs (01/12/2024)   PRAPARE - Administrator, Civil Service (Medical): No    Lack of Transportation (Non-Medical): No  Physical Activity: Inactive (01/12/2024)   Exercise Vital Sign    Days of Exercise per Week: 0 days    Minutes of Exercise per Session: 0 min  Stress: No Stress Concern Present (01/12/2024)   [employer] of Occupational Health - Occupational Stress Questionnaire    Feeling of Stress : Only a little  Social Connections: Socially Isolated  (01/12/2024)   Social Connection and Isolation Panel [NHANES]    Frequency of Communication with Friends and Family: Once a week    Frequency of Social Gatherings with Friends and Family: Never    Attends Religious Services: Never    Database administrator or Organizations: No    Attends Banker Meetings: Never    Marital Status: Never married    Tobacco Counseling Ready to quit: Not Answered Counseling given: Not Answered Tobacco comments: 01/12/24 Patient refused to answer smoking status  Clinical Intake:  Pre-visit preparation completed: Yes  Pain Score: 0-No pain     BMI - recorded: 35.28 Nutritional Status: BMI > 30  Obese Nutritional Risks: None Diabetes: No  How often do you need to have someone help you when you read instructions, pamphlets, or other written materials from your doctor or pharmacy?: 1 - Never  Interpreter Needed?: No  Information entered by :: Tora Kindred, CMA   Activities of Daily Living     01/12/2024    2:25 PM  In your present state of health, do you have any difficulty performing the following activities:  Hearing? 0  Vision? 0  Difficulty concentrating or making decisions? 0  Walking or climbing stairs? 1  Comment uses cane prn  Dressing or bathing? 0  Doing errands, shopping? 1  Comment doesn't drive, uses medical Sales executive and eating ? N  Using the Toilet? N  In the past six months, have you accidently leaked urine? N  Do you have problems with loss of bowel control? N  Managing your Medications? N  Managing your Finances? N  Housekeeping or managing your Housekeeping? N    Patient Care Team: [name], FNP as PCP - General (Family Medicine) [place], [place], [place] as [facility] Care Management  Indicate any recent Medical Services you may have received from other than [facility] providers in the past year (date may be approximate).     Assessment:   This is a routine wellness examination for  Kelly [name].  Hearing/Vision screen Hearing Screening - Comments:: Denies hearing loss Vision Screening - Comments:: Gets routine eye exams, Dr. Julianne Rice    Goals Addressed             This Visit's Progress    Patient Stated       "Nothing"       Depression Screen     01/12/2024    2:47 PM 01/03/2024   12:49 PM 12/31/2023    2:44 PM 12/20/2023    2:00 PM  PHQ 2/9 Scores  PHQ - 2 Score  4 4 4   PHQ- 9 Score  11  19  Exception Documentation Patient refusal       Fall Risk     01/12/2024    2:51 PM 01/03/2024   12:30 PM 12/31/2023    2:37 PM 12/20/2023    2:10 PM  Fall Risk   Falls in the past year? 1 1 1 1   Comment  Update    Number falls in past yr: 1 1 0 1  Injury with Fall? 1 0 0 0  Risk for fall due to : History of fall(s);Impaired balance/gait;Orthopedic patient;Impaired mobility;Medication side effect Impaired balance/gait;History of fall(s);Medication side effect;Other (Comment)    Risk for fall due to: Comment  Hx of Seizure Disorder    Follow up Falls prevention discussed;Falls evaluation completed;Education provided Falls prevention discussed      MEDICARE RISK AT HOME:  Medicare Risk at Home Any stairs in or around the home?: No If so, are there any without handrails?: No Home free of loose throw rugs in walkways, pet beds, electrical cords, etc?: Yes Adequate lighting in your home to reduce risk of falls?: Yes Life alert?: Yes Use of a cane, walker or w/c?: Yes (cane prn) Grab bars in the bathroom?: Yes Shower chair or bench in shower?: No Elevated toilet seat or a handicapped toilet?: No  TIMED UP AND GO:  Was the test performed?  No  Cognitive  Function: 6CIT completed        01/12/2024    2:53 PM  6CIT Screen  What Year? 0 points  What month? 3 points  What time? 0 points  Count back from 20 0 points  Months in reverse 4 points  Repeat phrase 6 points  Total Score 13 points    Immunizations Immunization History  Administered  Date(s) Administered   Influenza-Unspecified 09/11/2019    Screening Tests Health Maintenance  Topic Date Due   COVID-19 Vaccine (1) Never done   Pneumococcal Vaccine 27-53 Years old (1 of 2 - PCV) Never done   DTaP/Tdap/Td (1 - Tdap) Never done   Zoster Vaccines- Shingrix (1 of 2) Never done   Cervical Cancer Screening (HPV/Pap Cotest)  Never done   Colonoscopy  Never done   MAMMOGRAM  Never done   INFLUENZA VACCINE  06/17/2023   Medicare Annual Wellness (AWV)  01/11/2025   Hepatitis C Screening  Completed   HIV Screening  Completed   HPV VACCINES  Aged Out    Health Maintenance  Health Maintenance Due  Topic Date Due   COVID-19 Vaccine (1) Never done   Pneumococcal Vaccine 38-32 Years old (1 of 2 - PCV) Never done   DTaP/Tdap/Td (1 - Tdap) Never done   Zoster Vaccines- Shingrix (1 of 2) Never done   Cervical Cancer Screening (HPV/Pap Cotest)  Never done   Colonoscopy  Never done   MAMMOGRAM  Never done   INFLUENZA VACCINE  06/17/2023   Health Maintenance Items Addressed: See Nurse Notes  Additional Screening:  Vision Screening: Recommended annual ophthalmology exams for early detection of glaucoma and other disorders of the eye.  Dental Screening: Recommended annual dental exams for proper oral hygiene  Community Resource Referral / Chronic Care Management: CRR required this visit?  No   CCM required this visit?  No     Plan:     I have personally reviewed and noted the following in the patient's chart:   Medical and social history Use of alcohol, tobacco or illicit drugs  Current medications and supplements including opioid prescriptions. Patient is not currently taking opioid prescriptions. Functional ability and status Nutritional status Physical activity Advanced directives List of other physicians Hospitalizations, surgeries, and ER visits in previous 12 months Vitals Screenings to include cognitive, depression, and falls Referrals and  appointments  In addition, I have reviewed and discussed with patient certain preventive protocols, quality metrics, and best practice recommendations. A written personalized care plan for preventive services as well as general preventive health recommendations were provided to patient.     Tora Kindred, CMA   01/12/2024   After Visit Summary: (Pick Up) Due to this being a telephonic visit, with patients personalized plan was offered to patient and patient has requested to Pick up at office.  Notes:  Patient was very agitated and sometimes hostile during today's visit. Visit was completed to the best of my ability. 6 CIT Score - 13 Needs Tdap, shingles and covid vaccines Patient states she had a cologuard, but I could not find documentation of such. Patient follow by [facility] for pap smears  Abstracted results of MMG from 11/25/23

## 2024-01-13 ENCOUNTER — Telehealth: Payer: Self-pay | Admitting: Pharmacist

## 2024-01-13 NOTE — Progress Notes (Signed)
   01/13/2024  Patient ID: Kelly [name], female   DOB: [birth year], 58 y.o.   MRN: [id number]  Called patient regarding medication review visit scheduled today. Unable to reach at this time after several attempts. Left voicemail requesting call back at earliest convenience.  Second attempt completed. Will try again in 1 week.   Marlowe Aschoff, PharmD [facility] Phone Number: [phone]

## 2024-01-18 ENCOUNTER — Ambulatory Visit: Payer: 59 | Admitting: Family Medicine

## 2024-01-18 ENCOUNTER — Encounter: Payer: Self-pay | Admitting: Family Medicine

## 2024-01-18 ENCOUNTER — Ambulatory Visit: Payer: Self-pay | Admitting: *Deleted

## 2024-01-18 VITALS — BP 132/79 | HR 89 | Ht 65.0 in | Wt 211.6 lb

## 2024-01-18 DIAGNOSIS — Z91148 Patient's other noncompliance with medication regimen for other reason: Secondary | ICD-10-CM

## 2024-01-18 DIAGNOSIS — J4520 Mild intermittent asthma, uncomplicated: Secondary | ICD-10-CM | POA: Diagnosis not present

## 2024-01-18 DIAGNOSIS — Z6835 Body mass index (BMI) 35.0-35.9, adult: Secondary | ICD-10-CM

## 2024-01-18 DIAGNOSIS — I10 Essential (primary) hypertension: Secondary | ICD-10-CM

## 2024-01-18 DIAGNOSIS — R569 Unspecified convulsions: Secondary | ICD-10-CM

## 2024-01-18 DIAGNOSIS — E782 Mixed hyperlipidemia: Secondary | ICD-10-CM | POA: Diagnosis not present

## 2024-01-18 DIAGNOSIS — E66812 Obesity, class 2: Secondary | ICD-10-CM

## 2024-01-18 DIAGNOSIS — F250 Schizoaffective disorder, bipolar type: Secondary | ICD-10-CM | POA: Diagnosis not present

## 2024-01-18 NOTE — Assessment & Plan Note (Signed)
 Continue to make conscious decisions for well balanced diet smaller portions with increase protein, fruits, veggies, water as drink of choice, decrease starches, processed foods, and saturated fats. Increase weekly exercise - 150 minutes per week.

## 2024-01-18 NOTE — Patient Outreach (Signed)
 Care Coordination   01/18/2024 Name: Kelly [name] MRN: [id number] DOB: 01-Jan-1966   Care Coordination Outreach Attempts:  An unsuccessful telephone outreach was attempted today to offer the patient information about available complex care management services.  Follow Up Plan:  Additional outreach attempts will be made to offer the patient complex care management information and services.   Encounter Outcome:  No Answer   Care Coordination Interventions:  No, not indicated    Noor Witte, LCSW [facility] Licensed Clinical Social Worker Care Coordinator  Direct Dial: [phone]

## 2024-01-18 NOTE — Assessment & Plan Note (Signed)
 Pt brought in meds today Poor historian on certain meds Had two losartan (25mg  and & 100mg ) - instructed to stop taking 25mg , only take 100mg  daily Discussed pt call neurologist for refill on seizure medications- Vimpat

## 2024-01-18 NOTE — Assessment & Plan Note (Signed)
Chronic, stable. Continue atorvastatin 40 mg daily.

## 2024-01-18 NOTE — Assessment & Plan Note (Signed)
 Chronic, waxing and waning Pt appears to be in depressive episode with some psychosis today Stopped taking Risperdal 1.5 mg - recommended to restart Pt poor historian with meds Concerns for relapse starting Urgent referral to [facility] per pt request - hx of going there.

## 2024-01-18 NOTE — Assessment & Plan Note (Signed)
 Chronic stable Continue amlodipine 5mg  daily Continue losartan 100mg  daily Continue Clonidine 0.1mg  daily STOP losartan 25mg  DASH diet, weight lose, exercise

## 2024-01-18 NOTE — Progress Notes (Signed)
 Established Patient Office Visit  Introduced to nurse practitioner role and practice setting.  All questions answered.  Discussed provider/patient relationship and expectations.   Subjective   Patient ID: Kelly [name], female    DOB: 06-30-1966  Age: 58 y.o. MRN: [id number]  Chief Complaint  Patient presents with   Medication Management    4 week follow up for medication check. Patient was to bring all medications to office to update and reconcile her records    Kelly [name] is a 58 year old female with seizures and hypertension who presents for medication management and behavioral health follow-up. She was referred by her neurologist to a behavioral health specialist - has not followed up.  She is experiencing confusion regarding her current medication regimen, particularly with losartan, as she is unsure whether she should be taking 25 mg or 100 mg. She has been taking both doses, leading to uncertainty about her correct dosage. She also brought in [place] medications (ten boxes) that she no longer uses and is unsure how to dispose of them. Her current medications include losartan, amlodipine, and clonidine for blood pressure, as well as albuterol as needed. She is low on her seizure medication, Vimpat, and has not contacted her neurologist for a refill. She has not been taking Risperdal, although she has it at home.  She has a history of seizures and recalls having a seizure a few days after her last visit. She is currently low on Vimpat, which is crucial for her seizure management.  She mentions having 'psychotic tendencies' and hx of psychosis with suicidal or homicidal ideations. She was referred to a behavioral health specialist by her neurologist but has not attended the appointment. She had a virtual meeting with them where they discussed her medications, but she does not recall much else from the conversation. She expresses a preference to follow up with [facility] for her behavioral  health needs.  She has experienced weight loss, noting a decrease from 225 pounds to 210 pounds.        01/18/2024   10:21 AM 01/03/2024   12:49 PM 12/31/2023    2:44 PM  Depression screen PHQ 2/9  Decreased Interest 3 2 2   Down, Depressed, Hopeless 3 2 2   PHQ - 2 Score 6 4 4   Altered sleeping 3 2   Tired, decreased energy 3 2 2   Change in appetite 3 0 1  Feeling bad or failure about yourself  1 0 0  Trouble concentrating 3 1 1   Moving slowly or fidgety/restless 3 0 0  Suicidal thoughts 3 2 2   PHQ-9 Score 25 11   Difficult doing work/chores Somewhat difficult Very difficult        01/18/2024   10:21 AM 12/20/2023    2:10 PM  GAD 7 : Generalized Anxiety Score  Nervous, Anxious, on Edge 3 3  Control/stop worrying 2 3  Worry too much - different things 1 3  Trouble relaxing 2 3  Restless 2 3  Easily annoyed or irritable 2 3  Afraid - awful might happen 2 3  Total GAD 7 Score 14 21  Anxiety Difficulty Somewhat difficult Somewhat difficult     Review of Systems  All other systems reviewed and are negative.   Negative unless indicated in HPI   Objective:     BP 132/79   Pulse 89   Ht 5\' 5"  (1.651 m)   Wt 211 lb 9.6 oz (96 kg)   LMP  (LMP Unknown)  SpO2 98%   BMI 35.21 kg/m    Physical Exam Constitutional:      General: She is not in acute distress.    Appearance: Normal appearance. She is obese. She is not toxic-appearing or diaphoretic.  HENT:     Head: Normocephalic.     Nose: Nose normal.     Mouth/Throat:     Mouth: Mucous membranes are moist.     Pharynx: Oropharynx is clear.  Eyes:     Extraocular Movements: Extraocular movements intact.     Pupils: Pupils are equal, round, and reactive to light.  Cardiovascular:     Rate and Rhythm: Normal rate and regular rhythm.     Pulses: Normal pulses.     Heart sounds: Normal heart sounds. No murmur heard.    No friction rub. No gallop.  Pulmonary:     Effort: No respiratory distress.     Breath  sounds: No stridor. No wheezing, rhonchi or rales.  Chest:     Chest wall: No tenderness.  Musculoskeletal:     Right lower leg: No edema.     Left lower leg: No edema.  Skin:    General: Skin is warm and dry.     Capillary Refill: Capillary refill takes less than 2 seconds.  Neurological:     General: No focal deficit present.     Mental Status: She is alert and oriented to person, place, and time. Mental status is at baseline.  Psychiatric:        Attention and Perception: She is inattentive.        Mood and Affect: Mood is depressed. Affect is flat.        Speech: Speech is delayed.        Behavior: Behavior is slowed and withdrawn. Behavior is not agitated, aggressive, hyperactive or combative.        Thought Content: Thought content is paranoid. Thought content is not delusional. Thought content does not include homicidal or suicidal ideation. Thought content does not include homicidal or suicidal plan.        Cognition and Memory: She exhibits impaired recent memory.        Judgment: Judgment normal.    No results found for any visits on 01/18/24.    The 10-year ASCVD risk score (Arnett DK, et al., 2019) is: 9%    Assessment & Plan:  Primary hypertension Assessment & Plan: Chronic stable Continue amlodipine 5mg  daily Continue losartan 100mg  daily Continue Clonidine 0.1mg  daily STOP losartan 25mg  DASH diet, weight lose, exercise   Schizoaffective disorder, bipolar type (HCC) Assessment & Plan: Chronic, waxing and waning Pt appears to be in depressive episode with some psychosis today Stopped taking Risperdal 1.5 mg - recommended to restart Pt poor historian with meds Concerns for relapse starting Urgent referral to [facility] per pt request - hx of going there.    Mixed hyperlipidemia Assessment & Plan: Chronic, stable Continue atorvastatin 40 mg daily   Seizures (HCC) Assessment & Plan: Managed by Neurology through [facility], Dr. Malvin Johns typically  experiences breakthrough seizures 8-10 per year - last 11/02/23 per neuro note Currently managed with Depakote 500 mg BID, Lamictal 200 mg am and 300mg  pm, and Vimpat 200mg  BID. Discussed with pt to contact Dr. Malvin Johns for refills -Continue current seizure medications. -continue to wear safety arm band for emergencies     Class 2 obesity due to excess calories without serious comorbidity with body mass index (BMI) of 35.0 to 35.9 in adult Assessment &  Plan: Continue to make conscious decisions for well balanced diet smaller portions with increase protein, fruits, veggies, water as drink of choice, decrease starches, processed foods, and saturated fats. Increase weekly exercise - 150 minutes per week.     Mild intermittent asthma without complication Assessment & Plan: Chronic, stable Continue prn albuterol   Medication regimen deficit Assessment & Plan: Pt brought in meds today Poor historian on certain meds Had two losartan (25mg  and & 100mg ) - instructed to stop taking 25mg , only take 100mg  daily Discussed pt call neurologist for refill on seizure medications- Vimpat      Return in about 2 months (around 03/19/2024), or BP check and mood check.   I, [name], FNP, have reviewed all documentation for this visit. The documentation on 01/18/24 for the exam, diagnosis, procedures, and orders are all accurate and complete.   [name], FNP

## 2024-01-18 NOTE — Assessment & Plan Note (Signed)
Chronic, stable. Continue prn albuterol.

## 2024-01-18 NOTE — Assessment & Plan Note (Signed)
 Managed by Neurology through [facility], Dr. Malvin Johns typically experiences breakthrough seizures 8-10 per year - last 11/02/23 per neuro note Currently managed with Depakote 500 mg BID, Lamictal 200 mg am and 300mg  pm, and Vimpat 200mg  BID. Discussed with pt to contact Dr. Malvin Johns for refills -Continue current seizure medications. -continue to wear safety arm band for emergencies

## 2024-01-20 ENCOUNTER — Other Ambulatory Visit: Payer: Self-pay

## 2024-01-20 ENCOUNTER — Telehealth: Payer: Self-pay | Admitting: Pharmacist

## 2024-01-20 ENCOUNTER — Telehealth: Payer: Self-pay

## 2024-01-20 NOTE — Patient Instructions (Addendum)
 Thank you for allowing the Care Management team to participate in your care. It was great speaking with you today!  A Care Guide will contact you to schedule follow up outreach for next month.    Juanell Fairly [facility] Population Health RN Care Manager Direct Dial: [phone]  Fax: [phone] Website: [email or web address]

## 2024-01-20 NOTE — Progress Notes (Signed)
   01/20/2024  Patient ID: Kelly [name], female   DOB: 09/03/1966, 58 y.o.   MRN: [id number]  Med review completed with Dr. Ephriam Knuckles recently. Was unable to complete referral for medication review prior to 01/18/24 visit with PCP. No further attempts needed at this time as already tried twice to contact previously.    [name], PharmD [facility] Phone Number: [phone]

## 2024-01-20 NOTE — Patient Outreach (Signed)
  Care Management   Visit Note  01/20/2024 Name: Kelly [name] MRN: [id number] DOB: [birth year]  Subjective: Kelly [name] is a 58 y.o. year old female who is a primary care patient of Kelly Provencal, FNP. Engaged with Kelly [name] via telephone today.   Assessment:   Review of patient past medical history, allergies, medications, health status, including review of consultants reports, laboratory and other test data, was performed as part of  evaluation and provision of care management services.    Outpatient Encounter Medications as of 01/20/2024  Medication Sig Note   amLODipine (NORVASC) 5 MG tablet Take 1 tablet by mouth daily.    atorvastatin (LIPITOR) 40 MG tablet Take 40 mg by mouth at bedtime.    cloNIDine (CATAPRES) 0.1 MG tablet Take by mouth. 01/12/2024: Takes 1 tablet twice a day   divalproex (DEPAKOTE) 500 MG DR tablet Take 500 mg by mouth 2 (two) times daily.    fluticasone (FLONASE) 50 MCG/ACT nasal spray Place 1 spray into both nostrils 2 (two) times daily. (Patient not taking: Reported on 01/12/2024)    gabapentin (NEURONTIN) 300 MG capsule  01/12/2024: 1 capsule twice daily   lamoTRIgine (LAMICTAL) 200 MG tablet Take 1 tablet (200 mg total) by mouth 2 (two) times daily. (Patient taking differently: Take 200 mg by mouth 2 (two) times daily. Taking 300mg  HS)    losartan (COZAAR) 100 MG tablet Take 100 mg by mouth daily.    montelukast (SINGULAIR) 10 MG tablet Take 10 mg by mouth at bedtime.     nortriptyline (PAMELOR) 10 MG capsule Take 40 mg by mouth at bedtime. 01/12/2024: Been taking 1 capsule at bedtime   omeprazole (PRILOSEC) 40 MG capsule Take 40 mg by mouth daily.    risperiDONE (RISPERDAL) 0.5 MG tablet Take 1.5 mg by mouth at bedtime. (Patient not taking: Reported on 01/18/2024)    VENTOLIN HFA 108 (90 Base) MCG/ACT inhaler Inhale 2 puffs into the lungs every 6 (six) hours as needed.    VIMPAT 200 MG TABS tablet Take 200 mg by mouth 2 (two) times daily.     No  facility-administered encounter medications on file as of 01/20/2024.    Interventions:  Interventions Today    Flowsheet Row Most Recent Value  Chronic Disease   Chronic disease during today's visit Other  [Seizure Disorder]  General Interventions   General Interventions Discussed/Reviewed General Interventions Discussed, Communication with, Doctor Visits  Doctor Visits Discussed/Reviewed Doctor Visits Reviewed, Specialist  PCP/Specialist Visits Compliance with follow-up visit  [Will follow up with Neurologist/Dr. Malvin [name] on 04/11/24.]  Communication with --  [Communicated with clinic staff]  Exercise Interventions   Exercise Discussed/Reviewed Assistive device use and maintanence  Education Interventions   Education Provided Provided Education  Provided Verbal Education On Medication  Pharmacy Interventions   Pharmacy Dicussed/Reviewed Medications and their functions  Safety Interventions   Safety Discussed/Reviewed Safety Reviewed, Fall Risk  [Denied falls since last outreach]         PLAN Ms. Myer was experiencing problems with her phone today. Call lost volume and dropped before she could confirm availability for a follow up call. Will message Care Guide to contact and schedule follow-up outreach for next month.    Kelly [name] [facility] Population Health RN Care Manager Direct Dial: [phone]  Fax: [phone] Website: [email or web address]

## 2024-01-20 NOTE — Telephone Encounter (Signed)
Spoke with patient and advised. Verbalized understanding. 

## 2024-01-25 ENCOUNTER — Telehealth: Payer: Self-pay

## 2024-01-25 NOTE — Telephone Encounter (Signed)
-----   Message from [name] sent at 01/21/2024 11:52 AM EST ----- Regarding: FW: Neurology F/U  ----- Message ----- From: [name], FNP Sent: 01/18/2024  10:43 AM EST To: Ephriam Knuckles Nurse Subject: Neurology F/U                                  Can we call pt's Neurology clinic and request they refill her vimpat? Pt sees Dr. Theora Master. Pt needing refill.   Alphonzo Lemmings, MD  [phone] [facility] [address]  [facility]  [place], [place] [address]  [phone] (Work)  [phone] (Fax)    Thanks! Caryl Asp

## 2024-02-11 ENCOUNTER — Other Ambulatory Visit: Payer: Self-pay | Admitting: Family Medicine

## 2024-02-11 NOTE — Telephone Encounter (Signed)
 Copied from CRM [id number]. Topic: Clinical - Medication Refill >> Feb 11, 2024  9:25 AM Antwanette L wrote: Most Recent Primary Care Visit:  Provider: Charlcie Cradle A  Department: [facility]  Visit Type: OFFICE VISIT  Date: 01/18/2024  Medication: cloNIDine (CATAPRES) 0.1 MG tablet atorvastatin (LIPITOR) 40 MG tablet  Has the patient contacted their pharmacy? Yes   Is this the correct pharmacy for this prescription? Yes   [facility] [place] - [address] [place] [address] Phone: [phone] Fax: [phone]   Has the prescription been filled recently? No. Atorvastation last filled on 10/22/19 and clonidine last filled on 10/11/18  Is the patient out of the medication? Yes  Has the patient been seen for an appointment in the last year OR does the patient have an upcoming appointment? Yes  Can we respond through MyChart? No. Contact patient by phone at [phone]  Agent: Please be advised that Rx refills may take up to 3 business days. We ask that you follow-up with your pharmacy.

## 2024-02-14 MED ORDER — CLONIDINE HCL 0.1 MG PO TABS: 0.1000 mg | ORAL_TABLET | Freq: Every day | ORAL | 3 refills | Status: DC

## 2024-02-14 MED ORDER — ATORVASTATIN CALCIUM 40 MG PO TABS: 40.0000 mg | ORAL_TABLET | Freq: Every day | ORAL | 3 refills | Status: DC

## 2024-02-14 NOTE — Telephone Encounter (Signed)
 Requested medication (s) are due for refill today: -  Requested medication (s) are on the active medication list: historical meds  Last refill:  atorvastatin:11/29/19             clonidine: 10/11/2018  Future visit scheduled: yes  Notes to clinic:  hx provider   Requested Prescriptions  Pending Prescriptions Disp Refills   atorvastatin (LIPITOR) 40 MG tablet      Sig: Take 1 tablet (40 mg total) by mouth at bedtime.     Cardiovascular:  Antilipid - Statins Failed - 02/14/2024 12:15 PM      Failed - Lipid Panel in normal range within the last 12 months    Cholesterol, Total  Date Value Ref Range Status  12/20/2023 172 100 - 199 mg/dL Final   LDL Chol Calc (NIH)  Date Value Ref Range Status  12/20/2023 95 0 - 99 mg/dL Final   HDL  Date Value Ref Range Status  12/20/2023 57 >39 mg/dL Final   Triglycerides  Date Value Ref Range Status  12/20/2023 114 0 - 149 mg/dL Final         Passed - Patient is not pregnant      Passed - Valid encounter within last 12 months    Recent Outpatient Visits           3 weeks ago Primary hypertension   [place] [facility] [place], Jennette Kettle, FNP   1 month ago Primary hypertension   [place] [facility] [place], Jennette Kettle, FNP       Future Appointments             In 1 month Simmons-Robinson, Tawanna Cooler, MD [facility]             cloNIDine (CATAPRES) 0.1 MG tablet 60 tablet     Sig: Take by mouth.     Cardiovascular:  Alpha-2 Agonists Passed - 02/14/2024 12:15 PM      Passed - Last BP in normal range    BP Readings from Last 1 Encounters:  01/18/24 132/79         Passed - Last Heart Rate in normal range    Pulse Readings from Last 1 Encounters:  01/18/24 89         Passed - Valid encounter within last 6 months    Recent Outpatient Visits           3 weeks ago Primary hypertension   [facility] [place], Jennette Kettle, FNP   1  month ago Primary hypertension    [facility] [place], Jennette Kettle, FNP       Future Appointments             In 1 month Simmons-Robinson, Tawanna Cooler, MD [facility]

## 2024-03-20 ENCOUNTER — Ambulatory Visit: Admitting: Family Medicine

## 2024-04-11 ENCOUNTER — Other Ambulatory Visit: Payer: Self-pay

## 2024-04-11 ENCOUNTER — Emergency Department
Admission: EM | Admit: 2024-04-11 | Discharge: 2024-04-12 | Disposition: A | Discharge status: Home / Self Care | Attending: Emergency Medicine | Admitting: Emergency Medicine

## 2024-04-11 DIAGNOSIS — R443 Hallucinations, unspecified: Secondary | ICD-10-CM | POA: Diagnosis present

## 2024-04-11 DIAGNOSIS — R519 Headache, unspecified: Secondary | ICD-10-CM | POA: Diagnosis not present

## 2024-04-11 DIAGNOSIS — R569 Unspecified convulsions: Secondary | ICD-10-CM | POA: Diagnosis not present

## 2024-04-11 DIAGNOSIS — Z87898 Personal history of other specified conditions: Secondary | ICD-10-CM | POA: Diagnosis not present

## 2024-04-11 DIAGNOSIS — F259 Schizoaffective disorder, unspecified: Secondary | ICD-10-CM | POA: Insufficient documentation

## 2024-04-11 DIAGNOSIS — F419 Anxiety disorder, unspecified: Secondary | ICD-10-CM | POA: Insufficient documentation

## 2024-04-11 DIAGNOSIS — I10 Essential (primary) hypertension: Secondary | ICD-10-CM | POA: Insufficient documentation

## 2024-04-11 DIAGNOSIS — F329 Major depressive disorder, single episode, unspecified: Secondary | ICD-10-CM | POA: Diagnosis not present

## 2024-04-11 DIAGNOSIS — G250 Essential tremor: Secondary | ICD-10-CM | POA: Diagnosis not present

## 2024-04-11 DIAGNOSIS — M25569 Pain in unspecified knee: Secondary | ICD-10-CM | POA: Diagnosis not present

## 2024-04-11 DIAGNOSIS — R45851 Suicidal ideations: Secondary | ICD-10-CM | POA: Diagnosis not present

## 2024-04-11 DIAGNOSIS — R2689 Other abnormalities of gait and mobility: Secondary | ICD-10-CM | POA: Diagnosis not present

## 2024-04-11 DIAGNOSIS — G479 Sleep disorder, unspecified: Secondary | ICD-10-CM | POA: Diagnosis not present

## 2024-04-11 DIAGNOSIS — R42 Dizziness and giddiness: Secondary | ICD-10-CM | POA: Diagnosis not present

## 2024-04-11 LAB — COMPREHENSIVE METABOLIC PANEL WITH GFR
ALT: 15 U/L (ref 0–44)
AST: 17 U/L (ref 15–41)
Albumin: 3.5 g/dL (ref 3.5–5.0)
Alkaline Phosphatase: 69 U/L (ref 38–126)
Anion gap: 5 (ref 5–15)
BUN: 14 mg/dL (ref 6–20)
CO2: 28 mmol/L (ref 22–32)
Calcium: 8.7 mg/dL — ABNORMAL LOW (ref 8.9–10.3)
Chloride: 108 mmol/L (ref 98–111)
Creatinine, Ser: 0.78 mg/dL (ref 0.44–1.00)
GFR, Estimated: >60 mL/min (ref >60)
Glucose, Bld: 83 mg/dL (ref 70–99)
Potassium: 3.9 mmol/L (ref 3.5–5.1)
Sodium: 141 mmol/L (ref 135–145)
Total Bilirubin: 0.5 mg/dL (ref 0.0–1.2)
Total Protein: 7 g/dL (ref 6.5–8.1)

## 2024-04-11 LAB — CBC
HCT: 35.6 % — ABNORMAL LOW (ref 36.0–46.0)
Hemoglobin: 11.5 g/dL — ABNORMAL LOW (ref 12.0–15.0)
MCH: 26.7 pg (ref 26.0–34.0)
MCHC: 32.3 g/dL (ref 30.0–36.0)
MCV: 82.6 fL (ref 80.0–100.0)
Platelets: 241 10*3/uL (ref 150–400)
RBC: 4.31 MIL/uL (ref 3.87–5.11)
RDW: 16.5 % — ABNORMAL HIGH (ref 11.5–15.5)
WBC: 11.7 10*3/uL — ABNORMAL HIGH (ref 4.0–10.5)
nRBC: 0 % (ref 0.0–0.2)

## 2024-04-11 LAB — ETHANOL: Alcohol, Ethyl (B): <15 mg/dL (ref <15)

## 2024-04-11 NOTE — ED Notes (Signed)
 Nurse handed Patient her food and she was pleasant, said "thank you " remains calm and cooperative.

## 2024-04-11 NOTE — ED Notes (Signed)
 Pt reporting to ED d/t a comment that she made to provider about being okay with going to sleep and not waking up again. Provider sent her here for self harming thoughts, although the pt denies having any SI/HI. Pt does not seem to be very happy about being here. Pt is currently calm and cooperative, laying down in bed and watching television. Pt ABCs intact. RR even and unlabored. Pt in NAD. Bed in lowest locked position. Call bell in reach. Denies needs at this time.   Past Medical History:  Diagnosis Date   Acid reflux    Anxiety    Depression    Hypercholesteremia    Hypertension    Myocardial infarction (HCC)    Schizophrenia (HCC)    Seizures (HCC)    Sleep apnea

## 2024-04-11 NOTE — Consult Note (Signed)
 [facility] Psychiatric Consult Initial  Patient Name: .Kelly [name]  MRN: [id number]  DOB: Jun 28, 1966  Consult Order details:  Orders (From admission, onward)     Start     Ordered   04/11/24 1149  CONSULT TO CALL ACT TEAM       Ordering Provider: Claria Crofts, MD  Provider:  (Not yet assigned)  Question:  Reason for Consult?  Answer:  Psych consult   04/11/24 1149   04/11/24 1149  IP CONSULT TO PSYCHIATRY       Ordering Provider: Claria Crofts, MD  Provider:  (Not yet assigned)  Question Answer Comment  Consult Timeframe ROUTINE - requires response within 24 hours   Reason for Consult? Consult for medication management   Contact phone number where the requesting provider can be reached [phone]      04/11/24 1149             Mode of Visit: In person    Psychiatry Consult Evaluation  Service Date: Apr 11, 2024 LOS:  LOS: 0 days  Chief Complaint  Kelly [name] is a 58 year old female with history of affective disorder, hypertension, seizure disorder presenting to the emergency department for evaluation of suicidal ideation.  Patient had a scheduled appointment with her neurologist today.  There, patient was noted to have suicidal ideation with hallucinations and delusions.  Recommended medication changes including increasing her Risperdal and nortriptyline  for hallucinations and headaches were made, the patient was directed to the ER for further evaluation in the setting of her suicidal ideation. Historically, pt has been diagnosed with Schizophrenia, anxiety. She has a medical hx of HTN, HLD, and Seizure disorder.    Upon face-to-face assessment:  patient in bed awake, restless, guarded, angry. Appears healthy and well nourished. Alert and oriented x 4. She is irritable. She has poor eye contact, is angry with the providers at the clinic. She admits that she did say that she wants to die "but its not that I am going home and shoot myself, or do something, its just a way to express  how I feel". Patient becomes more and more agitated, cursing, calling names.   When asked about mental health hx, patient reports she has had that kind of thoughts years ago "in my 30s, you know when you are going through life stressors.Kelly AasAaron [name]"She does reports a hx of hallucinations. Has not been taking any medications.  Patient  continues to state  "I said it but doesn't mean I will do it, you can say things, but it doesn't mean you are going to do it". Per chart review, medications include Clonidine , Depakote , Gabapentin, Lamictal , Nortriptyline , Risperidone. Patient not willing to discuss about these medications and states she has been healthy and not taking any medications. She appears guarded and fidgety. Denies HI. When asked about hallucinations, patient stays silent for a few seconds, then shakes her head.   Patient denies substance abuse but admits to drinking beer occasionally and smoking cigarettes.  Patient presents with passive suicidal ideations along with a hx of a mental illness. She reports medication non-adherence. She presents with  agitations and experiencing self-control difficulties as evidenced by her aggressive communications,  characterized by cursing, calling names etc. Involuntary commitment initiated. We will reevaluate in AM to determine disposition.    Primary Psychiatric Diagnoses  MDD 2.  Schizophrenia 3.  Anxiety  Assessment  Kelly [name] is a 58 y.o. female admitted: Presented to the EDfor 04/11/2024 11:30 AM. She carries the psychiatric diagnoses of Schizophrenia, MDD,  Anxiety and has a past medical history of  HTN, HLD, and Seizures.   Her current presentation of suicidal ideations is most consistent with her psychiatric hx, medication hx and current health status. She meets criteria for overnight observation  based on current symptoms.  Current outpatient psychotropic medications include Risperidone, Depakote , Gabapentin,  Lamictal  and Nortriptylin, and  she reports not  taking any medications "I have been fine".   On initial examination, patient presents with increased agitations, anxiety and passive SI. Please see plan below for detailed recommendations.   Diagnoses:  Active Hospital problems: Active Problems:   Suicidal ideations    Plan   ## Psychiatric Medication Recommendations:  To be determined  ## Medical Decision Making Capacity: Not specifically addressed in this encounter  ## Further Work-up:  -- Labs  -- most recent EKG: NA -- Pertinent labwork reviewed earlier this admission includes: In process   ## Disposition:-- We recommend overnight monitoring and Reevaluation in AM  ## Behavioral / Environmental: - No specific recommendations at this time.     ## Safety and Observation Level:  - Based on my clinical evaluation, I estimate the patient to be at high risk of self harm in the current setting. - At this time, we recommend  routine safety precautions. This decision is based on my review of the chart including patient's history and current presentation, interview of the patient, mental status examination, and consideration of suicide risk including evaluating suicidal ideation, plan, intent, suicidal or self-harm behaviors, risk factors, and protective factors. This judgment is based on our ability to directly address suicide risk, implement suicide prevention strategies, and develop a safety plan while the patient is in the clinical setting. Please contact our team if there is a concern that risk level has changed.  CSSR Risk Category:C-SSRS RISK CATEGORY: Low Risk  Suicide Risk Assessment: Patient has following modifiable risk factors for suicide: under treated depression  and medication noncompliance, which we are addressing by recommending overnight monitoring and reevaluation in AM to determine disposition. Patient has following non-modifiable or demographic risk factors for suicide: psychiatric hospitalization Patient has the  following protective factors against suicide: Frustration tolerance  Thank you for this consult request. Recommendations have been communicated to the primary team.  We will recommend overnight monitoring and reassessment in AM at this time.   Elston Halsted, NP       History of Present Illness  Relevant Aspects of Hospital ED Course:  Admitted on 04/11/2024 for suicidal ideations.   Patient Report:  "I don't mean it"  Psych ROS:  Depression: reported Anxiety:  reported Mania (lifetime and current): NA Psychosis: (lifetime and current): Reported  Collateral information:  Contacted : NA  Review of Systems  Constitutional: Negative.   HENT: Negative.    Eyes: Negative.   Respiratory: Negative.    Cardiovascular: Negative.   Gastrointestinal: Negative.   Genitourinary: Negative.   Musculoskeletal: Negative.   Skin: Negative.   Neurological: Negative.   Endo/Heme/Allergies: Negative.   Psychiatric/Behavioral:  Positive for depression and suicidal ideas. The patient is nervous/anxious.      Psychiatric and Social History  Psychiatric History:  Information collected from patient/chart review  Prev Dx/Sx: Schizophrenia, Depression, Anxiety Current Psych Provider: NA Home Meds (current): Depakote , Risperidone, Lamictal , Nortriptylin Previous Med Trials: NA Therapy: NA  Prior Psych Hospitalization: Reported  Prior Self Harm: Reported Prior Violence: NA  Family Psych History: NA Family Hx suicide: NA  Social History:  Developmental Hx: NA Educational Hx: NA Occupational  Hx: NA Legal Hx: NA Living Situation: Lives alone Spiritual Hx: NA Access to weapons/lethal means: NA   Substance History Alcohol: Occasional  Type of alcohol Beer Last Drink Not reported Number of drinks per day  1 or 2 History of alcohol withdrawal seizures NA History of DT's NA Tobacco: Smokes cigarettes Illicit drugs: NA Prescription drug abuse: NA Rehab hx: NA  Exam Findings   Physical Exam:  Vital Signs:  Weight:  [96.6 kg] 96.6 kg (05/27 1109) Height 5\' 5"  (1.651 m), weight 96.6 kg. Body mass index is 35.45 kg/m.  Physical Exam Vitals and nursing note reviewed.  HENT:     Head: Normocephalic and atraumatic.     Right Ear: Tympanic membrane normal.     Left Ear: Tympanic membrane normal.     Nose: Nose normal.     Mouth/Throat:     Mouth: Mucous membranes are moist.  Eyes:     Pupils: Pupils are equal, round, and reactive to light.  Pulmonary:     Effort: Pulmonary effort is normal.  Musculoskeletal:        General: Normal range of motion.     Cervical back: Normal range of motion.  Neurological:     General: No focal deficit present.     Mental Status: She is alert and oriented to person, place, and time.     Mental Status Exam: General Appearance: Casual  Orientation:  Full (Time, Place, and Person)  Memory:  Immediate;   Fair Recent;   Fair Remote;   Fair  Concentration:  Concentration: Fair and Attention Span: Fair  Recall:  Fair  Attention  Fair  Eye Contact:  Poor  Speech:  Clear and Coherent  Language:  Fair  Volume:  Increased  Mood: Angry, depressed, anxious  Affect:  Depressed  Thought Process:  Coherent  Thought Content:  WDL  Suicidal Thoughts:  Yes.  without intent/plan  Homicidal Thoughts:  No  Judgement:  Fair  Insight:  Fair  Psychomotor Activity:  Restlessness  Akathisia:  NA  Fund of Knowledge:  Fair      Assets:  Manufacturing systems engineer Desire for Improvement Housing  Cognition:  WNL  ADL's:  Intact  AIMS (if indicated):        Other History   These have been pulled in through the EMR, reviewed, and updated if appropriate.  Family History:  The patient's family history includes Arthritis in her mother; Breast cancer in her mother; Diabetes in her maternal aunt; Other in her father.  Medical History: Past Medical History:  Diagnosis Date   Acid reflux    Anxiety    Depression    Hypercholesteremia     Hypertension    Myocardial infarction (HCC)    Schizophrenia (HCC)    Seizures (HCC)    Sleep apnea     Surgical History: Past Surgical History:  Procedure Laterality Date   CARDIAC CATHETERIZATION     CESAREAN SECTION     DILATION AND CURETTAGE OF UTERUS     FOOT SURGERY     TONSILLECTOMY       Medications:  No current facility-administered medications for this encounter.  Current Outpatient Medications:    amLODipine  (NORVASC ) 5 MG tablet, Take 1 tablet by mouth daily., Disp: , Rfl:    atorvastatin  (LIPITOR) 40 MG tablet, Take 1 tablet (40 mg total) by mouth at bedtime., Disp: 90 tablet, Rfl: 3   cloNIDine  (CATAPRES ) 0.1 MG tablet, Take 1 tablet (0.1 mg total) by mouth daily.,  Disp: 60 tablet, Rfl: 3   divalproex  (DEPAKOTE ) 500 MG DR tablet, Take 500 mg by mouth 2 (two) times daily., Disp: , Rfl:    fluticasone  (FLONASE ) 50 MCG/ACT nasal spray, Place 1 spray into both nostrils 2 (two) times daily. (Patient not taking: Reported on 01/12/2024), Disp: , Rfl:    gabapentin (NEURONTIN) 300 MG capsule, , Disp: , Rfl:    lamoTRIgine  (LAMICTAL ) 200 MG tablet, Take 1 tablet (200 mg total) by mouth 2 (two) times daily. (Patient taking differently: Take 200 mg by mouth 2 (two) times daily. Taking 300mg  HS), Disp: 60 tablet, Rfl: 2   losartan  (COZAAR ) 100 MG tablet, Take 100 mg by mouth daily., Disp: , Rfl:    montelukast  (SINGULAIR ) 10 MG tablet, Take 10 mg by mouth at bedtime. , Disp: , Rfl: 11   nortriptyline  (PAMELOR ) 10 MG capsule, Take 40 mg by mouth at bedtime., Disp: , Rfl:    omeprazole (PRILOSEC) 40 MG capsule, Take 40 mg by mouth daily., Disp: , Rfl:    risperiDONE (RISPERDAL) 0.5 MG tablet, Take 1.5 mg by mouth at bedtime. (Patient not taking: Reported on 01/18/2024), Disp: , Rfl:    VENTOLIN  HFA 108 (90 Base) MCG/ACT inhaler, Inhale 2 puffs into the lungs every 6 (six) hours as needed., Disp: 6.7 g, Rfl: 11   VIMPAT  200 MG TABS tablet, Take 200 mg by mouth 2 (two) times daily. ,  Disp: , Rfl: 3  Allergies: Allergies  Allergen Reactions   Percocet [Oxycodone -Acetaminophen ] Nausea And Vomiting    Elston Halsted, NP

## 2024-04-11 NOTE — ED Triage Notes (Signed)
 Pt brought over from [facility] after seeing the neurologist. Pt is not speaking in coherent sentences. Pt denies SI, but states if they "went to sleep and didn't wake up that would be easier than....." but wouldn't finish the sentence or elaborate.

## 2024-04-11 NOTE — ED Notes (Signed)
 Pt voices concerns of being able to notify her transportation if she will need a ride home before 5pm.

## 2024-04-11 NOTE — ED Notes (Signed)
 Called lab to get blood work.

## 2024-04-11 NOTE — ED Notes (Signed)
 Nurse went in room to introduce self and to talk with Patient and she refused, she would not acknowledge nurse. Patient is watching tv and is safe, no signs of distress.

## 2024-04-11 NOTE — BH Assessment (Signed)
 Comprehensive Clinical Assessment (CCA) Screening, Triage and Referral Note  04/11/2024 Kelly [name] [id number]  Kelly [name], 58 year old female who presents to [facility] ED involuntarily for treatment. Per triage note, Pt brought over from [facility] after seeing the neurologist. Pt is not speaking in coherent sentences. Pt denies SI, but states if they "went to sleep and didn't wake up that would be easier than....." but wouldn't finish the sentence or elaborate.     During TTS assessment pt presents [name] and oriented x 4, restless but cooperative, and mood-congruent with affect. The pt does not appear to be responding to internal or external stimuli. Neither is the pt presenting with any delusional thinking. Pt verified the information provided to triage RN.   Pt identifies her main complaint to be that she does not know why she was brought to the ED. Patient reports she went to her regular scheduled neurologist appointment and was bombarded with a ton of questions. Patient states she was being honest in responding to the questions; however, things were taken out of context. "If I wanted to kill myself, I would just do it." Patient denies using any illicit substances or alcohol. Patient admits to smoking cigarettes when she feels like she is becoming depressed. "I may smoke 4 to relax and then that's it." Pt reports INPT hx in [place]  several years ago. Patient reports since moving to [place] 6 years ago, she is not currently being followed by a TEFL teacher. Patient states she used to attend groups at [facility] but they stopped taking her insurance so she is no longer connected to them. "I really enjoyed those groups." Patient reports taking medications for her cholesterol, high blood pressure, and seizures. Pt denies current SI/HI/AH/VH. Pt contracts for safety.    Per Alwin Joy, NP pt is recommended for overnight observation to be reassessed in the morning.  Chief Complaint:  Chief Complaint  Patient presents  with   Suicidal   Visit Diagnosis: Major depressive disorder  Patient Reported Information How did you hear about us ? Other (Comment) [facility])  What Is the Reason for Your Visit/Call Today? Patient reports she is not sure why she was brought to the ED. Patient made passive suicidal statement while answering questions at neurologist appointment.  How Long Has This Been Causing You Problems? <Week  What Do You Feel Would Help You the Most Today? -- (Assessment only)   Have You Recently Had Any Thoughts About Hurting Yourself? No  Are You Planning to Commit Suicide/Harm Yourself At This time? No   Have you Recently Had Thoughts About Hurting Someone Kelly [name]? No  Are You Planning to Harm Someone at This Time? No  Explanation: No data recorded  Have You Used Any Alcohol or Drugs in the Past 24 Hours? No  How Long Ago Did You Use Drugs or Alcohol? No data recorded What Did You Use and How Much? No data recorded  Do You Currently Have a Therapist/Psychiatrist? No  Name of Therapist/Psychiatrist: No data recorded  Have You Been Recently Discharged From Any Office Practice or Programs? No  Explanation of Discharge From Practice/Program: No data recorded   CCA Screening Triage Referral Assessment Type of Contact: Face-to-Face  Telemedicine Service Delivery:   Is this Initial or Reassessment?   Date Telepsych consult ordered in [facility]:    Time Telepsych consult ordered in [facility]:    Location of Assessment: [facility] ED  Provider Location: [facility] ED    Collateral Involvement: None provided   Does Patient Have  a Automotive engineer Guardian? No data recorded Name and Contact of Legal Guardian: No data recorded If Minor and Not Living with Parent(s), Who has Custody? No data recorded Is CPS involved or ever been involved? No data recorded Is APS involved or ever been involved? No data recorded  Patient Determined To Be At Risk for Harm To Self or Others Based on Review of  Patient Reported Information or Presenting Complaint? No  Method: No Plan  Availability of Means: No access or NA  Intent: Vague intent or NA  Notification Required: No need or identified person  Additional Information for Danger to Others Potential: No data recorded Additional Comments for Danger to Others Potential: No data recorded Are There Guns or Other Weapons in Your Home? No data recorded Types of Guns/Weapons: No data recorded Are These Weapons Safely Secured?                            No data recorded Who Could Verify You Are Able To Have These Secured: No data recorded Do You Have any Outstanding Charges, Pending Court Dates, Parole/Probation? No data recorded Contacted To Inform of Risk of Harm To Self or Others: No data recorded  Does Patient Present under Involuntary Commitment? Yes    [place] of Residence:    Patient Currently Receiving the Following Services: Medication Management   Determination of Need: Emergent (2 hours)   Options For Referral: ED Visit; Medication Management   Disposition Recommendation per psychiatric provider: Per Alwin Joy, NP pt is recommended for overnight observation to be reassessed in the morning.    Kelly [name], Counselor, LCAS-A

## 2024-04-11 NOTE — ED Notes (Signed)
Pt given a warm blanket 

## 2024-04-11 NOTE — ED Provider Notes (Signed)
 [facility] Provider Note    Event Date/Time   First MD Initiated Contact with Patient 04/11/24 1130     (approximate)   History   Suicidal   HPI  Kelly [name] is a 58 year old female with history of affective disorder, hypertension, seizure disorder presenting to the emergency department for evaluation of suicidal ideation.  Patient had a scheduled appointment with her neurologist today.  There, patient was noted to have suicidal ideation with hallucinations and delusions.  Recommended medication changes including increasing her Risperdal and nortriptyline  for hallucinations and headaches were made, the patient was directed to the ER for further evaluation in the setting of her suicidal ideation.  On my evaluation, patient tells me she is upset as she does not plan to act on her suicidal thoughts.  She reports that she was directly asked if she had these thoughts and is frustrated that she was sent to the ER after answering honestly.  She denies any specific plan to me, but did note that if she went to sleep and did not wake up that it would be easier in triage.     Physical Exam   Triage Vital Signs: ED Triage Vitals  Encounter Vitals Group     BP --      Systolic BP Percentile --      Diastolic BP Percentile --      Pulse --      Resp --      Temp --      Temp src --      SpO2 --      Weight 04/11/24 1109 213 lb (96.6 kg)     Height 04/11/24 1109 5\' 5"  (1.651 m)     Head Circumference --      Peak Flow --      Pain Score 04/11/24 1108 8     Pain Loc --      Pain Education --      Exclude from Growth Chart --     Most recent vital signs: There were no vitals filed for this visit.   General: Awake, interactive  CV:  Regular rate, good peripheral perfusion.  Resp:  Unlabored respirations.  Abd:  Nondistended.  Neuro:  Symmetric facial movement, fluid speech   ED Results / Procedures / Treatments   Labs (all labs ordered are listed,  but only abnormal results are displayed) Labs Reviewed  COMPREHENSIVE METABOLIC PANEL WITH GFR - Abnormal; Notable for the following components:      Result Value   Calcium  8.7 (*)    All other components within normal limits  CBC - Abnormal; Notable for the following components:   WBC 11.7 (*)    Hemoglobin 11.5 (*)    HCT 35.6 (*)    RDW 16.5 (*)    All other components within normal limits  ETHANOL  URINE DRUG SCREEN, QUALITATIVE ([facility] ONLY)     EKG EKG independently reviewed interpreted by myself (ER attending) demonstrates:    RADIOLOGY Imaging independently reviewed and interpreted by myself demonstrates:   Formal Radiology Read:  No results found.  PROCEDURES:  Critical Care performed: No  Procedures   MEDICATIONS ORDERED IN ED: Medications - No data to display   IMPRESSION / MDM / ASSESSMENT AND PLAN / ED COURSE  I reviewed the triage vital signs and the nursing notes.  Differential diagnosis includes, but is not limited to, primary psychiatric disorder, substance-induced mood disorder, acute stress response  Patient's presentation is  most consistent with acute presentation with potential threat to life or bodily function.  59 year old female presenting with passive SI.  History of schizoaffective disorder, but not obviously responding to internal stimuli and logical thought process on my evaluation.  Initially wishing to leave, but I did discuss concerns about her neurologist and recommendation to continue her evaluation here.  She is agreeable.  Consult to psychiatry and TTS placed.  In the absence of active SI, will hold off on IVC for now.  The patient has been placed in psychiatric observation due to the need to provide a safe environment for the patient while obtaining psychiatric consultation and evaluation, as well as ongoing medical and medication management to treat the patient's condition.  The patient has not been placed under full IVC at this  time.  1515:  Notified by psychiatry NP Lorrene Rosser that she evaluated the patient and requested that the patient be placed under IVC given her clinical history and expressed treatments here.  IVC form was completed.  Disposition pending further psychiatry recommendations.     FINAL CLINICAL IMPRESSION(S) / ED DIAGNOSES   Final diagnoses:  Suicidal ideation     Rx / DC Orders   ED Discharge Orders     None        Note:  This document was prepared using Dragon voice recognition software and may include unintentional dictation errors.   Claria Crofts, MD 04/11/24 [phone]

## 2024-04-11 NOTE — ED Notes (Signed)
 This RN called [employer] to inform them that the patient will not be needing a ride home today. Pt uses [employer] for regular transportation. [phone]

## 2024-04-11 NOTE — ED Notes (Signed)
 Snack refused at this time

## 2024-04-11 NOTE — ED Notes (Signed)
 IVC PENDING  CONSULT ?

## 2024-04-12 ENCOUNTER — Encounter: Payer: Self-pay | Admitting: Psychiatry

## 2024-04-12 DIAGNOSIS — R45851 Suicidal ideations: Secondary | ICD-10-CM | POA: Diagnosis not present

## 2024-04-12 NOTE — ED Notes (Signed)
 Pt adamantly refusing vital signs. Cursing at staff and telling staff to get out of pts room. Redirection and therapeutic communications attempted by this RN and Anyhia, EDT - unsuccessful.

## 2024-04-12 NOTE — ED Notes (Signed)
 Pt ambulating to bathroom. Refuses to give urine sample.

## 2024-04-12 NOTE — ED Provider Notes (Signed)
 Emergency Medicine Observation Re-evaluation Note  Kelly [name] is a 58 y.o. female, seen on rounds today.  Pt initially presented to the ED for complaints of Suicidal Currently, the patient is resting.  Physical Exam  BP (!) 153/83   Pulse 75   Temp 98.4 F (36.9 C) (Oral)   Resp 18   Ht 1.651 m (5\' 5" )   Wt 96.6 kg   LMP  (LMP Unknown)   SpO2 95%   BMI 35.45 kg/m  Physical Exam Gen:  No acute distress Resp:  Breathing easily and comfortably, no accessory muscle usage Neuro:  Moving all four extremities, no gross focal neuro deficits Psych:  Resting currently, calm when awake  ED Course / MDM  EKG:   I have reviewed the labs performed to date as well as medications administered while in observation.  Recent changes in the last 24 hours include evaluation by psychiatry.  Plan  Current plan is for overnight observation and reassessment later today.    Lynnda Sas, MD 04/12/24 [phone]

## 2024-04-12 NOTE — ED Provider Notes (Signed)
 Emergency Medicine Observation Re-evaluation Note  Kelly [name] is a 58 y.o. female, seen on rounds today.  Pt initially presented to the ED for complaints of Suicidal Currently, the patient is resting comfortably in bed.  Physical Exam  BP 137/82 (BP Location: Left Arm)   Pulse 78   Temp 98 F (36.7 C) (Oral)   Resp 18   Ht 5\' 5"  (1.651 m)   Wt 96.6 kg   LMP  (LMP Unknown)   SpO2 95%   BMI 35.45 kg/m  Physical Exam General: No acute distress, denies SI  ED Course / MDM  EKG:    Plan  Current plan --patient was evaluated by psychiatry today, recommend dropping IVC, no concern for her being threat to self or others.  Recommend discharge home and routine outpatient follow-up.  I personally reevaluated patient, very calm and cooperative here in the emergency department.  Denies any SI, HI, hallucinations.  Says she was talking about it traumatic event in her life 35 years ago yesterday and felt this was misunderstood.   I agree patient is stable for discharge home.  Will drop IVC in coordination with psychiatry recommendations and proceed with discharge.    Collis Deaner, MD 04/12/24 1452

## 2024-04-12 NOTE — ED Notes (Signed)
 Pt breakfast provided at bedside

## 2024-04-12 NOTE — Discharge Instructions (Signed)
 You were evaluated in the emergency department for suicidal thoughts, and you have been cleared by psychiatry provider.  Please do follow-up with your primary care provider and any mental health providers you may have, and return to the emergency department with any new or worsening symptoms.

## 2024-04-12 NOTE — ED Notes (Signed)
 Ivc /recommend overnight monitoring and re-evaluate this am

## 2024-04-12 NOTE — ED Notes (Signed)
 IVC released discharge inst to pt.  Taxi cab voucher given by hospital.  Brook Canterbury charge nurse gave voucher.

## 2024-04-12 NOTE — Consult Note (Signed)
 [facility] Consult Follow-up  Patient Name: .Kelly [name]  MRN: [id number]  DOB: 28-Sep-1966  Consult Order details:  Orders (From admission, onward)     Start     Ordered   04/11/24 1149  CONSULT TO CALL ACT TEAM       Ordering Provider: Claria Crofts, MD  Provider:  (Not yet assigned)  Question:  Reason for Consult?  Answer:  Psych consult   04/11/24 1149   04/11/24 1149  IP CONSULT TO PSYCHIATRY       Ordering Provider: Claria Crofts, MD  Provider:  (Not yet assigned)  Question Answer Comment  Consult Timeframe ROUTINE - requires response within 24 hours   Reason for Consult? Consult for medication management   Contact phone number where the requesting provider can be reached [phone]      04/11/24 1149             Mode of Visit: In person    Psychiatry Consult Evaluation  Service Date: Apr 12, 2024 LOS:  LOS: 0 days  Chief Complaint "I didn't mean it like that"   Kelly [name] is a 58 year old female with history of  hypertension, seizure disorder presenting to the emergency department for evaluation of suicidal ideations.  Patient had a scheduled appointment with her neurologist yesterday .  There, patient was noted to have suicidal ideation with hallucinations and delusions.  Recommended medication changes including increasing her Risperdal and nortriptyline  for hallucinations and headaches were made, the patient was directed to the ER for further evaluation in the setting of her suicidal ideation. Historically, pt has been diagnosed with Schizophrenia, anxiety. She has a medical hx of HTN, HLD, and Seizure disorder.    Patient was evaluated by Psychiatry  and it was reported that she was experiencing anxiety, was restless, guarded, angry but was  alert and oriented x 4. She was irritable with  poor eye contact, was angry with the providers at the clinic. She admitted  that she did say that she wanted to die "but its not that I am going home and shoot myself, or do  something, its just a way to express how I feel". Patient was  more and more agitated, cursing, calling names.   When asked about mental health hx, patient reported  she has had that kind of thoughts years ago "in my 30s, you know when you are going through life stressors.Aaron AasAaron Aas"She did   report a hx of hallucinations. Has not been taking any medications for her mental health.  Patient kept saying   "I said it but doesn't mean I will do it, you can say things, but it doesn't mean you are going to do it". Per chart review, medications include Clonidine , Depakote , Gabapentin, Lamictal , Nortriptyline , Risperidone. Patient was not willing to discuss about these medications and stated she has been healthy and not taking any medications. She appeared  guarded and fidgety. Denied HI. When asked about hallucinations, patient stayed  silent for a few seconds, then shaked her head.   Patient denied substance abuse but admitted to drinking beer occasionally and smoking cigarettes. Overnight monitoring was recommended. Per nursing report, patient has remained calm and cooperative and continues to report that she did not mean to harm herself.   Upon reevaluation today: Kelly [name] is in bed awake. Alert and oriented. Expressing concerns about staying here even if she said she was not going to harm herself. Reports she is ready to go home and take care of herself. Reports her  and her family are taking a one week trip to [place]. She reports no need for a therapist "I don't need no help with mental health, I got it". She reports she does not want to discuss about psychotropics and states "I know I have to take care of my seizures". Patient denies SI/HI/AVH and does not appear to be preoccupied. She appears less distressed, calmer and more cooperative.  Her thought process is coherent and goal-directed. She repeats that she did not mean to harm herself and will never do that again.  Her speech is clear and well articulated. Patient states "I  guess I have to watch what I say". Patient reports she has neighbors who are supportive in addition to her family members.  She denies respiratory distress. Denies chest/back/abdominal pain. Reports her appetite is fair and she was able to sleep a decent amount of time last night.   Patient does not appears to be in any acute distress. She is knowledgeable of her medical needs and reports she has been managing well. Currently denies need for mental health services. She is encouraged for outpatient services and states she will think about it. No acute psychiatric symptoms noted at this moment.   Primary Psychiatric Diagnoses  Schizoaffective disorder    Assessment  Kelly [name] is a 58 y.o. female admitted: Presented to the EDfor 04/11/2024 11:30 AM for an evaluation . She carries the psychiatric diagnoses of Schizoaffective disorder and has a past medical history of  seizure disorder.   She currently presents with normal mood and thought content and denying thoughts of suicide. Denies HI/AVH.She is psychiatrically stable  based on my assessment.   She currently does not take psychiatric medications  although it is indicated that medications were prescribed in the past. .  On initial examination, patient is calm and cooperative, and her thought process is coherent. She reports she did not mean to harm herself and requests to be discharge for self-care. Please see plan below for detailed recommendations.   Diagnoses:  Active Hospital problems: Active Problems:   Suicidal ideations    Plan   ## Psychiatric Medication Recommendations:  NA. Patient reports she does not need any recommendations  ## Medical Decision Making Capacity: Not specifically addressed in this encounter  ## Further Work-up:  -- NA  -- most recent EKG: NA -- Pertinent labwork reviewed earlier this admission includes: CBC, CMP   ## Disposition:-- There are no psychiatric contraindications to discharge at this  time  ## Behavioral / Environmental: - No specific recommendations at this time.     ## Safety and Observation Level:  - Based on my clinical evaluation, I estimate the patient to be at low risk of self harm in the current setting. - At this time, we recommend  routine. This decision is based on my review of the chart including patient's history and current presentation, interview of the patient, mental status examination, and consideration of suicide risk including evaluating suicidal ideation, plan, intent, suicidal or self-harm behaviors, risk factors, and protective factors. This judgment is based on our ability to directly address suicide risk, implement suicide prevention strategies, and develop a safety plan while the patient is in the clinical setting. Please contact our team if there is a concern that risk level has changed.  CSSR Risk Category:C-SSRS RISK CATEGORY: Error: Q3, 4, or 5 should not be populated when Q2 is No  Suicide Risk Assessment: Patient has following modifiable risk factors for suicide: medication noncompliance and lack of  access to outpatient mental health resources, which we are addressing by recommending outpatient services. Patient has following non-modifiable or demographic risk factors for suicide: psychiatric hospitalization Patient has the following protective factors against suicide: Supportive family and Supportive friends  Thank you for this consult request. Recommendations have been communicated to the primary team.  We will recommend discharge at this time.   Elston Halsted, NP       History of Present Illness  Relevant Aspects of Hospital ED Course:  Admitted on 04/11/2024 for suicidal ideations.   Patient Report:  "I didn't mean like that"  Psych ROS:  Depression: NA Anxiety:  reported Mania (lifetime and current): NA Psychosis: (lifetime and current): reported  Collateral information:  Contacted : NA  Review of Systems   Constitutional: Negative.   HENT: Negative.    Eyes: Negative.   Respiratory: Negative.    Cardiovascular: Negative.   Gastrointestinal: Negative.   Genitourinary: Negative.   Musculoskeletal: Negative.   Neurological: Negative.   Endo/Heme/Allergies: Negative.   Psychiatric/Behavioral:  Positive for depression.      Psychiatric and Social History  Psychiatric History:  Information collected from patient/chart/nursing  Prev Dx/Sx: Schizoaffective Disorder Current Psych Provider: NA Home Meds (current): Reports she is currently not taking any  psych medications though prescribed  Previous Med Trials: Risperidone, Depakote , lamictal  Therapy: NA  Prior Psych Hospitalization: Reported  Prior Self Harm: NA Prior Violence: NA  Family Psych History: NA Family Hx suicide: NA  Social History:  Developmental Hx: NA Educational Hx: NA Occupational Hx: NA Legal Hx: NA Living Situation: Lives alone Spiritual Hx: NA Access to weapons/lethal means: NA   Substance History Alcohol: occasional  Type of alcohol beer Last Drink not reported Number of drinks per day 1 or 2 History of alcohol withdrawal seizures : pt reports hx of seizures. Origine not specified History of DT's NA Tobacco: Reports hx Illicit drugs: NA Prescription drug abuse: NA Rehab hx: NA  Exam Findings  Physical Exam:  Vital Signs:  Temp:  [98 F (36.7 C)-98.4 F (36.9 C)] 98 F (36.7 C) (05/28 0751) Pulse Rate:  [75-78] 78 (05/28 0751) Resp:  [18-19] 18 (05/28 0751) BP: (137-153)/(82-83) 137/82 (05/28 0751) SpO2:  [95 %] 95 % (05/28 0751) Blood pressure 137/82, pulse 78, temperature 98 F (36.7 C), temperature source Oral, resp. rate 18, height 5\' 5"  (1.651 m), weight 96.6 kg, SpO2 95%. Body mass index is 35.45 kg/m.  Physical Exam Vitals and nursing note reviewed.  HENT:     Head: Normocephalic and atraumatic.     Right Ear: Tympanic membrane normal.     Left Ear: Tympanic membrane normal.      Nose: Nose normal.     Mouth/Throat:     Mouth: Mucous membranes are moist.  Eyes:     Pupils: Pupils are equal, round, and reactive to light.  Cardiovascular:     Rate and Rhythm: Normal rate.     Pulses: Normal pulses.  Pulmonary:     Effort: Pulmonary effort is normal.  Musculoskeletal:        General: Normal range of motion.     Cervical back: Normal range of motion and neck supple.  Neurological:     General: No focal deficit present.     Mental Status: She is alert and oriented to person, place, and time.     Mental Status Exam: General Appearance: Casual  Orientation:  Full (Time, Place, and Person)  Memory:  Immediate;   Fair Recent;  Fair Remote;   Fair  Concentration:  Concentration: Fair and Attention Span: Fair  Recall:  Fair  Attention  Fair  Eye Contact:  Fair  Speech:  Normal Rate  Language:  Fair  Volume:  Normal  Mood: anxious  Affect:  Appropriate  Thought Process:  Coherent  Thought Content:  WDL  Suicidal Thoughts:  No  Homicidal Thoughts:  No  Judgement:  Fair  Insight:  Fair  Psychomotor Activity:  Normal  Akathisia:  NA  Fund of Knowledge:  Fair      Assets:  Manufacturing systems engineer Desire for Improvement Housing Social Support  Cognition:  WNL  ADL's:  Intact  AIMS (if indicated):        Other History   These have been pulled in through the EMR, reviewed, and updated if appropriate.  Family History:  The patient's family history includes Arthritis in her mother; Breast cancer in her mother; Diabetes in her maternal aunt; Other in her father.  Medical History: Past Medical History:  Diagnosis Date   Acid reflux    Anxiety    Depression    Hypercholesteremia    Hypertension    Myocardial infarction (HCC)    Schizophrenia (HCC)    Seizures (HCC)    Sleep apnea     Surgical History: Past Surgical History:  Procedure Laterality Date   CARDIAC CATHETERIZATION     CESAREAN SECTION     DILATION AND CURETTAGE OF UTERUS      FOOT SURGERY     TONSILLECTOMY       Medications:  No current facility-administered medications for this encounter.  Current Outpatient Medications:    atorvastatin  (LIPITOR) 40 MG tablet, Take 1 tablet (40 mg total) by mouth at bedtime., Disp: 90 tablet, Rfl: 3   divalproex  (DEPAKOTE ) 500 MG DR tablet, Take 500 mg by mouth 2 (two) times daily., Disp: , Rfl:    fluticasone  (FLONASE ) 50 MCG/ACT nasal spray, Place 1 spray into both nostrils 2 (two) times daily., Disp: , Rfl:    gabapentin (NEURONTIN) 300 MG capsule, , Disp: , Rfl:    lamoTRIgine  (LAMICTAL ) 200 MG tablet, Take 1 tablet (200 mg total) by mouth 2 (two) times daily. (Patient taking differently: Take 200 mg by mouth 2 (two) times daily. Taking 300mg  HS), Disp: 60 tablet, Rfl: 2   losartan  (COZAAR ) 25 MG tablet, Take 25 mg by mouth daily., Disp: , Rfl:    montelukast  (SINGULAIR ) 10 MG tablet, Take 10 mg by mouth at bedtime. , Disp: , Rfl: 11   nortriptyline  (PAMELOR ) 10 MG capsule, Take 40 mg by mouth at bedtime., Disp: , Rfl:    omeprazole (PRILOSEC) 40 MG capsule, Take 40 mg by mouth daily., Disp: , Rfl:    VENTOLIN  HFA 108 (90 Base) MCG/ACT inhaler, Inhale 2 puffs into the lungs every 6 (six) hours as needed., Disp: 6.7 g, Rfl: 11   VIMPAT  200 MG TABS tablet, Take 200 mg by mouth 2 (two) times daily. , Disp: , Rfl: 3   amLODipine  (NORVASC ) 5 MG tablet, Take 1 tablet by mouth daily. (Patient not taking: Reported on 04/12/2024), Disp: , Rfl:    cloNIDine  (CATAPRES ) 0.1 MG tablet, Take 1 tablet (0.1 mg total) by mouth daily. (Patient not taking: Reported on 04/12/2024), Disp: 60 tablet, Rfl: 3   risperiDONE (RISPERDAL) 0.5 MG tablet, Take 1.5 mg by mouth at bedtime. (Patient not taking: Reported on 01/12/2024), Disp: , Rfl:   Allergies: Allergies  Allergen Reactions   Percocet [Oxycodone -Acetaminophen ]  Nausea And Vomiting    Elston Halsted, NP

## 2024-04-12 NOTE — ED Notes (Signed)
 IVC PAPERS  RESCINDED PER  DR  MIAN MD   INFORMED  AMY RN

## 2024-05-04 ENCOUNTER — Ambulatory Visit (INDEPENDENT_AMBULATORY_CARE_PROVIDER_SITE_OTHER): Admitting: Family Medicine

## 2024-05-04 ENCOUNTER — Encounter: Payer: Self-pay | Admitting: Family Medicine

## 2024-05-04 VITALS — BP 124/85 | HR 83 | Ht 60.0 in | Wt 212.0 lb

## 2024-05-04 DIAGNOSIS — R569 Unspecified convulsions: Secondary | ICD-10-CM | POA: Diagnosis not present

## 2024-05-04 DIAGNOSIS — J4520 Mild intermittent asthma, uncomplicated: Secondary | ICD-10-CM | POA: Diagnosis not present

## 2024-05-04 DIAGNOSIS — I10 Essential (primary) hypertension: Secondary | ICD-10-CM

## 2024-05-04 DIAGNOSIS — E782 Mixed hyperlipidemia: Secondary | ICD-10-CM

## 2024-05-04 DIAGNOSIS — F250 Schizoaffective disorder, bipolar type: Secondary | ICD-10-CM

## 2024-05-04 MED ORDER — LOSARTAN POTASSIUM 25 MG PO TABS: 25.0000 mg | ORAL_TABLET | Freq: Every day | ORAL | 11 refills | Status: AC

## 2024-05-04 MED ORDER — AMLODIPINE BESYLATE 5 MG PO TABS: 5.0000 mg | ORAL_TABLET | Freq: Every day | ORAL | 11 refills | Status: AC

## 2024-05-04 MED ORDER — MONTELUKAST SODIUM 10 MG PO TABS: 10.0000 mg | ORAL_TABLET | Freq: Every day | ORAL | 11 refills | Status: AC

## 2024-05-04 NOTE — Progress Notes (Signed)
 Established Patient Office Visit  Introduced to nurse practitioner role and practice setting.  All questions answered.  Discussed provider/patient relationship and expectations.   Subjective   Patient ID: Kelly [name], female    DOB: 16-Jun-1966  Age: 58 y.o. MRN: [id number]  Chief Complaint  Patient presents with   Hypertension    Discussed the use of AI scribe software for clinical note transcription with the patient, who gave verbal consent to proceed.  History of Present Illness Kelly [name] is a 58 year old female with hypertension and seizure disorder who presents for medication management and follow-up.  She requires a refill for montelukast  for asthma, which she prefers to receive through [facility] for long delivery.  She is currently taking losartan  and amlodipine  for hypertension. She confirms that she takes both medications, although there was some initial confusion about the number of blood pressure medications. Her blood pressure was previously in the 150s, and she attributes some of her improvement to dietary changes, including using an air fryer to reduce cholesterol intake.  She has a history of seizures, which have been mostly controlled. She mentions a recent unwitnessed seizure approximately two weeks ago, which she suspects occurred during sleep. She uses a CPAP machine to help manage her sleep-related seizures. Has an appt with neurology, Dr. Walden Guise on 05/10/24.  She has a history of asthma and allergies, for which she uses albuterol  as needed. She has not been attending mental health appointments since moving to [place]  and acknowledges the need to find a new provider. A referral for a psychologist was made on May 28th, but she has not yet received a call to set up an appointment.       05/04/2024   11:10 AM 01/18/2024   10:21 AM 01/03/2024   12:49 PM  Depression screen PHQ 2/9  Decreased Interest 3 3 2   Down, Depressed, Hopeless 3 3 2   PHQ - 2 Score 6 6 4    Altered sleeping  3 2  Tired, decreased energy 1 3 2   Change in appetite 1 3 0  Feeling bad or failure about yourself  2 1 0  Trouble concentrating 3 3 1   Moving slowly or fidgety/restless 1 3 0  Suicidal thoughts 2 3 2   PHQ-9 Score  25 11  Difficult doing work/chores Very difficult Somewhat difficult Very difficult       05/04/2024   11:11 AM 01/18/2024   10:21 AM 12/20/2023    2:10 PM  GAD 7 : Generalized Anxiety Score  Nervous, Anxious, on Edge 2 3 3   Control/stop worrying 2 2 3   Worry too much - different things 2 1 3   Trouble relaxing 3 2 3   Restless 0 2 3  Easily annoyed or irritable 0 2 3  Afraid - awful might happen 2 2 3   Total GAD 7 Score 11 14 21   Anxiety Difficulty Very difficult Somewhat difficult Somewhat difficult     Review of Systems  All other systems reviewed and are negative.   Negative unless indicated in HPI   Objective:     BP 124/85   Pulse 83   Ht 5' (1.524 m)   Wt 212 lb (96.2 kg)   LMP  (LMP Unknown)   SpO2 100%   BMI 41.40 kg/m    Physical Exam Constitutional:      General: She is not in acute distress.    Appearance: Normal appearance. She is obese. She is not toxic-appearing or diaphoretic.  HENT:  Head: Normocephalic.     Nose: Nose normal.     Mouth/Throat:     Mouth: Mucous membranes are moist.     Pharynx: Oropharynx is clear.   Eyes:     Extraocular Movements: Extraocular movements intact.     Pupils: Pupils are equal, round, and reactive to light.    Cardiovascular:     Rate and Rhythm: Normal rate and regular rhythm.     Pulses: Normal pulses.     Heart sounds: Normal heart sounds. No murmur heard.    No friction rub. No gallop.  Pulmonary:     Effort: No respiratory distress.     Breath sounds: No stridor. No wheezing, rhonchi or rales.  Chest:     Chest wall: No tenderness.   Musculoskeletal:     Right lower leg: No edema.     Left lower leg: No edema.   Skin:    General: Skin is warm and dry.      Capillary Refill: Capillary refill takes less than 2 seconds.   Neurological:     General: No focal deficit present.     Mental Status: She is alert and oriented to person, place, and time. Mental status is at baseline.   Psychiatric:        Attention and Perception: Attention normal.        Mood and Affect: Mood normal. Affect is flat.        Speech: Speech is delayed.        Behavior: Behavior is slowed. Behavior is cooperative.        Thought Content: Thought content normal.        Cognition and Memory: Memory is impaired.        Judgment: Judgment normal.     Comments: Difficulty remembering medications and what they are for. Very scattered thoughts, otherwise pleasant.      No results found for any visits on 05/04/24.    The ASCVD Risk score (Arnett DK, et al., 2019) failed to calculate for the following reasons:   Risk score cannot be calculated because patient has a medical history suggesting prior/existing ASCVD    Assessment & Plan:  Primary hypertension -     amLODIPine  Besylate; Take 1 tablet (5 mg total) by mouth daily.  Dispense: 30 tablet; Refill: 11 -     Losartan  Potassium; Take 1 tablet (25 mg total) by mouth daily.  Dispense: 30 tablet; Refill: 11 -     AMB Referral [facility] Care Management  Mild intermittent asthma without complication -     Montelukast  Sodium; Take 1 tablet (10 mg total) by mouth at bedtime.  Dispense: 30 tablet; Refill: 11  Seizures (HCC) -     AMB Referral [facility] Care Management  Schizoaffective disorder, bipolar type (HCC) -     AMB Referral [facility] Care Management  Mixed hyperlipidemia -     AMB Referral [facility] Care Management     Assessment and Plan Assessment & Plan Seizure Disorder and Headache disorder Seizures occur during sleep, controlled with medication. Possible unwitnessed seizure reported. Neurology manages medications. - Sees Dr. Walden [facility]ise at [facility] - next app on 05/10/24 - One lamictal , vimpat , and depakote  - Continue  seizure medication management through neurology. - Coordinate with neurologist for medication adjustments. - On Pamelor  and gabapentin for headache mgmt  Hypertension Blood pressure controlled with losartan  and amlodipine .  - Dietary changes may have improved control. - Refill losartan  and amlodipine  through [facility]. - GOAL <130/80 -  low sodium diet, exercise as tolerated, well balanced diet, weight loss  Hyperlipidemia Cholesterol well-managed with medication and dietary changes. - Continue atorvastatin   Asthma Managed with montelukast  and albuterol  as needed. - Refill montelukast  through [facility].  Schizoaffective Disorder No mental health services attended since moving.  Referred to psychologist by neurologist several times Pt inconsistent with medication (Risperdal) Recent ED visit due suicidal ideations, denies any today awaiting appointment setup - referral was placed on 5 /28/25.  Given pt's comorbidity and mental health concerns - inconsistent hx taking and knowledge of medications - concerns for needing assistance for medical care - [facility]I order placed.   Return in about 4 months (around 09/03/2024) for chronic disease mgmt.   I, Tasia Farr, FNP, have reviewed all documentation for this visit. The documentation on 05/04/24 for the exam, diagnosis, procedures, and orders are all accurate and complete.   Tasia Farr, FNP

## 2024-05-18 ENCOUNTER — Telehealth: Payer: Self-pay

## 2024-05-18 NOTE — Progress Notes (Signed)
 Complex Care Management Note Care Guide Note  05/18/2024 Name: Kelly [name] MRN: [id number] DOB: 02/03/1966   Complex Care Management Outreach Attempts: An unsuccessful telephone outreach was attempted today to offer the patient information about available complex care management services.  Follow Up Plan:  Additional outreach attempts will be made to offer the patient complex care management information and services.   Encounter Outcome:  No Answer  Dreama [facility]  [facility], [facility] Health Care Management Assistant Direct Dial: [phone]  Fax: [phone]

## 2024-05-24 DIAGNOSIS — R42 Dizziness and giddiness: Secondary | ICD-10-CM | POA: Diagnosis not present

## 2024-05-24 DIAGNOSIS — M542 Cervicalgia: Secondary | ICD-10-CM | POA: Diagnosis not present

## 2024-05-24 DIAGNOSIS — R569 Unspecified convulsions: Secondary | ICD-10-CM | POA: Diagnosis not present

## 2024-05-24 DIAGNOSIS — R251 Tremor, unspecified: Secondary | ICD-10-CM | POA: Diagnosis not present

## 2024-05-24 DIAGNOSIS — R2689 Other abnormalities of gait and mobility: Secondary | ICD-10-CM | POA: Diagnosis not present

## 2024-05-24 DIAGNOSIS — R519 Headache, unspecified: Secondary | ICD-10-CM | POA: Diagnosis not present

## 2024-05-24 DIAGNOSIS — G479 Sleep disorder, unspecified: Secondary | ICD-10-CM | POA: Diagnosis not present

## 2024-05-24 DIAGNOSIS — M79601 Pain in right arm: Secondary | ICD-10-CM | POA: Diagnosis not present

## 2024-05-24 DIAGNOSIS — Z79899 Other long term (current) drug therapy: Secondary | ICD-10-CM | POA: Diagnosis not present

## 2024-05-25 NOTE — Progress Notes (Addendum)
 Complex Care Management Note  Care Guide Note 05/25/2024 Name: Doni Bacha MRN: [id number] DOB: 1966/07/08  Anela Bensman is a 58 y.o. year old female who sees Wellington Curtis LABOR, FNP for primary care. I reached out to Garrie Remington by phone today to offer complex care management services.  Ms. Wittmeyer was given information about Complex Care Management services today including:   The Complex Care Management services include support from the care team which includes your Nurse Care Manager, Clinical Social Worker, or Pharmacist.  The Complex Care Management team is here to help remove barriers to the health concerns and goals most important to you. Complex Care Management services are voluntary, and the patient may decline or stop services at any time by request to their care team member.   Complex Care Management Consent Status: Patient agreed to services and verbal consent obtained.   Follow up plan:  Telephone appointment with complex care management team member scheduled for:  7/29/5 at 9:00 a.m.   Encounter Outcome:  Patient Scheduled  Dreama [facility]  [facility], [facility] Health Care Management Assistant Direct Dial: [phone]  Fax: [phone]

## 2024-05-30 ENCOUNTER — Other Ambulatory Visit: Payer: Self-pay

## 2024-05-30 ENCOUNTER — Encounter: Payer: Self-pay | Admitting: Psychiatry

## 2024-05-30 ENCOUNTER — Ambulatory Visit (INDEPENDENT_AMBULATORY_CARE_PROVIDER_SITE_OTHER): Admitting: Psychiatry

## 2024-05-30 VITALS — BP 130/85 | HR 91 | Temp 96.7°F | Ht 60.0 in | Wt 216.0 lb

## 2024-05-30 DIAGNOSIS — F251 Schizoaffective disorder, depressive type: Secondary | ICD-10-CM | POA: Diagnosis not present

## 2024-05-30 NOTE — Progress Notes (Signed)
 Psychiatric Initial Adult Assessment   Patient Identification: Kelly [name] MRN:  [id number] Date of Evaluation:  05/30/2024 Referral Source: Curtis Boom FNP Chief Complaint:   Chief Complaint  Patient presents with   Establish Care   Visit Diagnosis:    ICD-10-CM   1. Schizoaffective disorder, depressive type (HCC)  F25.1       History of Present Illness: 58 year old patient presenting to [facility] for establishing care.  Patient reports that she has been having issues regarding her medication management stating that she is having cognitive difficulties and is not sure how to manage her tremors as well as her cognitive thinking as well as talking.  Patient appears to be taking pauses to think about what she wants to say and also getting stuck while talking to this provider.  Patient also is observed with mild tremors throughout her upper extremities with witnessing of a mild tremor while she is writing on her notes for the intake paperwork.  Patient reports that she was recently seen on May 2025 that she was seen for suicidal ideation due to having stress as she is by herself here in this state with no family or friends around.  Patient states she resides in a retirement community and states that she gets lonely at times and states that the suicidal ideation will come on when she is overwhelmed with loneliness and is depressed.  At this time she does state that she is being evaluated by Dr. Lane as she has significant seizure history and is currently being managed by Dr. Lane with the current regimen of Lamictal  300 mg once before bed, Depakote  500 mg twice a day, risperidone 1.5 once a day, and nortriptyline  40 mg daily at bedtime.  Based on this assessment interview is recommended for the patient be referred to Dr. Vickey due to the patient's age as well as the complications with the relation to neurology.  Patient is recommended to be evaluated by Dr. Vickey or higher level of care.  Patient  has been recommended not to make any medications at this time and wait for a follow-up appointment.  Patient has been encouraged that if she has suicidal thoughts with or without a plan to please call 911 or go to the closest emergency department.  Patient verbalized understanding.  Patient states she has no questions or concerns at this time.  Patient is in agreement with treatment plan.  Patient states that she denies SI, HI, AVH at this time.  Associated Signs/Symptoms: Depression Symptoms:  fatigue, hopelessness, suicidal thoughts without plan, suicidal attempt, (Hypo) Manic Symptoms:  Negative Anxiety Symptoms:  Negative Psychotic Symptoms:  Denies PTSD Symptoms: Negative  Past Psychiatric History:  Previous Psych Hospitalizations: - ER visit, SI 04/11/2024 -Er Visit, Anxiety 04/08/2016 Outpatient treatment:  - Primary, Curtis Boom FNP - Neurology, Dr. Lane at [facility] Medications Current: - Depakote  500mg  BID - Lamictal  300mg  before bed - Nortiptyline 40mg  daily at bedtime - Risperidone 0.5mg , 1.5mg  at bedtime Next Steps: - Refer to [facility] Medication Trials: - Citalopram  2021 - Haldol  2019 - Latuda  2019 - Serqouel 12/20/2023  Suicide & Violence: - SI in 04/09/2024 Substance Use: -Denies Psychotherapy: -States she is participating in therapy, unable to recall Legal:  - Denies Previous Psychotropic Medications: Yes   Substance Abuse History in the last 12 months:  No.  Consequences of Substance Abuse: Negative  Past Medical History:  Past Medical History:  Diagnosis Date   Acid reflux    Anxiety    Depression  Hypercholesteremia    Hypertension    Myocardial infarction (HCC)    Schizophrenia (HCC)    Seizures (HCC)    Sleep apnea     Past Surgical History:  Procedure Laterality Date   CARDIAC CATHETERIZATION     CESAREAN SECTION     DILATION AND CURETTAGE OF UTERUS     FOOT SURGERY     TONSILLECTOMY      Family Psychiatric History: No  additional  Family History:  Family History  Problem Relation Age of Onset   Breast cancer Mother    Arthritis Mother    Other Father        old age   Diabetes Maternal Aunt    Ovarian cancer Neg Hx    Colon cancer Neg Hx     Social History:   Social History   Socioeconomic History   Marital status: Single    Spouse name: Not on file   Number of children: 2   Years of education: Not on file   Highest education level: High school graduate  Occupational History   Not on file  Tobacco Use   Smoking status: Some Days    Current packs/day: 0.00    Types: Cigarettes    Start date: 2002    Last attempt to quit: 2017    Years since quitting: 8.5   Smokeless tobacco: Former   Tobacco comments:    01/12/24 Patient refused to answer smoking status  Vaping Use   Vaping status: Never Used  Substance and Sexual Activity   Alcohol use: Yes    Comment: rare, less than monthly   Drug use: Not Currently    Types: Marijuana    Comment: ocas   Sexual activity: Not Currently  Other Topics Concern   Not on file  Social History Narrative   Not on file   Social Drivers of Health   Financial Resource Strain: Low Risk  (01/12/2024)   Overall Financial Resource Strain (CARDIA)    Difficulty of Paying Living Expenses: Not hard at all  Food Insecurity: No Food Insecurity (01/12/2024)   Hunger Vital Sign    Worried About Running Out of Food in the Last Year: Never true    Ran Out of Food in the Last Year: Never true  Transportation Needs: No Transportation Needs (01/12/2024)   PRAPARE - Administrator, Civil Service (Medical): No    Lack of Transportation (Non-Medical): No  Physical Activity: Inactive (01/12/2024)   Exercise Vital Sign    Days of Exercise per Week: 0 days    Minutes of Exercise per Session: 0 min  Stress: No Stress Concern Present (01/12/2024)   [employer] of Occupational Health - Occupational Stress Questionnaire    Feeling of Stress : Only a  little  Social Connections: Socially Isolated (01/12/2024)   Social Connection and Isolation Panel    Frequency of Communication with Friends and Family: Once a week    Frequency of Social Gatherings with Friends and Family: Never    Attends Religious Services: Never    Database administrator or Organizations: No    Attends Banker Meetings: Never    Marital Status: Never married    Additional Social History: No Additional  Allergies:   Allergies  Allergen Reactions   Percocet [Oxycodone -Acetaminophen ] Nausea And Vomiting    Metabolic Disorder Labs: Lab Results  Component Value Date   HGBA1C 5.4 12/20/2023   MPG 99.67 07/12/2018   No results found for:  PROLACTIN Lab Results  Component Value Date   CHOL 172 12/20/2023   TRIG 114 12/20/2023   HDL 57 12/20/2023   CHOLHDL 3.0 12/20/2023   VLDL 16 07/12/2018   LDLCALC 95 12/20/2023   LDLCALC 155 (H) 07/12/2018   Lab Results  Component Value Date   TSH 1.010 12/20/2023    Therapeutic Level Labs: No results found for: LITHIUM No results found for: CBMZ Lab Results  Component Value Date   VALPROATE 71 12/23/2023    Current Medications: Current Outpatient Medications  Medication Sig Dispense Refill   amLODipine  (NORVASC ) 5 MG tablet Take 1 tablet (5 mg total) by mouth daily. 30 tablet 11   atorvastatin  (LIPITOR) 40 MG tablet Take 1 tablet (40 mg total) by mouth at bedtime. 90 tablet 3   cloNIDine  (CATAPRES ) 0.1 MG tablet Take 1 tablet (0.1 mg total) by mouth daily. (Patient not taking: Reported on 05/04/2024) 60 tablet 3   divalproex  (DEPAKOTE ) 500 MG DR tablet Take 500 mg by mouth 2 (two) times daily.     fluticasone  (FLONASE ) 50 MCG/ACT nasal spray Place 1 spray into both nostrils 2 (two) times daily.     gabapentin (NEURONTIN) 300 MG capsule      lamoTRIgine  (LAMICTAL ) 200 MG tablet Take 1 tablet (200 mg total) by mouth 2 (two) times daily. (Patient taking differently: Take 200 mg by mouth 2 (two)  times daily. Taking 300mg  HS) 60 tablet 2   losartan  (COZAAR ) 25 MG tablet Take 1 tablet (25 mg total) by mouth daily. 30 tablet 11   montelukast  (SINGULAIR ) 10 MG tablet Take 1 tablet (10 mg total) by mouth at bedtime. 30 tablet 11   nortriptyline  (PAMELOR ) 10 MG capsule Take 40 mg by mouth at bedtime.     omeprazole (PRILOSEC) 40 MG capsule Take 40 mg by mouth daily.     risperiDONE (RISPERDAL) 0.5 MG tablet Take 1.5 mg by mouth at bedtime. (Patient not taking: Reported on 05/04/2024)     VENTOLIN  HFA 108 (90 Base) MCG/ACT inhaler Inhale 2 puffs into the lungs every 6 (six) hours as needed. 6.7 g 11   VIMPAT  200 MG TABS tablet Take 200 mg by mouth 2 (two) times daily.   3   No current facility-administered medications for this visit.    Musculoskeletal: Strength & Muscle Tone: abnormal and decreased Gait & Station: unsteady Patient leans: N/A  Psychiatric Specialty Exam: Review of Systems  Constitutional: Negative.   HENT: Negative.    Eyes: Negative.   Respiratory: Negative.    Cardiovascular: Negative.   Gastrointestinal: Negative.   Endocrine: Negative.   Genitourinary: Negative.   Musculoskeletal: Negative.   Skin: Negative.   Allergic/Immunologic: Negative.     Blood pressure 130/85, pulse 91, temperature (!) 96.7 F (35.9 C), temperature source Temporal, height 5' (1.524 m), weight 216 lb (98 kg).Body mass index is 42.18 kg/m.  General Appearance: Well Groomed  Eye Contact:  Good  Speech:  Slow and Slurred  Volume:  Normal  Mood:  Anxious, Depressed, and Hopeless  Affect:  Appropriate  Thought Process:  Coherent  Orientation:  Full (Time, Place, and Person)  Thought Content:  Logical  Suicidal Thoughts:  No  Homicidal Thoughts:  No  Memory:  Immediate;   Good Recent;   Good  Judgement:  Fair  Insight:  Fair  Psychomotor Activity:  Normal  Concentration:  Concentration: Good and Attention Span: Good  Recall:  Good  Fund of Knowledge:Good  Language: Good   Akathisia:  No  Handed:  Right  AIMS (if indicated):    Assets:  Desire for Improvement Financial Resources/Insurance Housing  ADL's:  Intact  Cognition: WNL and Impaired,  Mild  Sleep:  Good   Screenings: GAD-7    Flowsheet Row Office Visit from 05/04/2024 in [facility] Office Visit from 01/18/2024 in [facility] Office Visit from 12/20/2023 in [facility]  Total GAD-7 Score 11 14 21    PHQ2-9    Flowsheet Row Office Visit from 05/04/2024 in [facility] Office Visit from 01/18/2024 in [facility] Patient Outreach from 01/03/2024 in [place] [facility]PART[facility] Care Coordination from 12/31/2023 in [facility] Coordination Office Visit from 12/20/2023 in [facility]  PHQ-2 Total Score 6 6 4 4 4   PHQ-9 Total Score -- 25 11 -- 19   Flowsheet Row ED from 04/11/2024 in [facility] Emergency Department at [facility] Patient Outreach from 01/03/2024 in [place] [facility] DEPARTMENT ED from 12/23/2023 in [facility] Emergency Department at [facility]  C-SSRS RISK CATEGORY Error: Q3, 4, or 5 should not be populated when Q2 is No Error: Question 6 not populated No Risk    Assessment and Plan:  Assessment - Diagnosis: Schizoaffective disorder, depressive type (HCC) [F25.1]   - Progress: Baseline, Referred to [facility], recommended to see Dr. Vickey. - Risk Factors: Worsening symptoms, suicidal risk.   Plan - Medications:  No changes to medications, pt referred to Dr. Vickey - Psychotherapy: No recommendations - Education: Pt educated to wait for call back from clinic for follow-up after Dr. Vickey has reviewed her chart.  - Follow-Up: No follow-up - Referrals: No referrals - Safety Planning:  The patient has been educated, if they should have suicidal thoughts with or without a plan  to call 911, or go to the closest emergency department.  Pt verbalized understanding.  Pt denies firearms within the home.  Pt also agrees to call the clinic should they have worsening symptoms before the next appointment.    Patient/Guardian was advised Release of Information must be obtained prior to any record release in order to collaborate their care with an outside provider. Patient/Guardian was advised if they have not already done so to contact the registration department to sign all necessary forms in order for us  to release information regarding their care.   Consent: Patient/Guardian gives verbal consent for treatment and assignment of benefits for services provided during this visit. Patient/Guardian expressed understanding and agreed to proceed.   Dorn Jama Der, NP 7/15/202510:31 AM

## 2024-06-12 ENCOUNTER — Telehealth: Payer: Self-pay

## 2024-06-13 ENCOUNTER — Telehealth: Payer: Self-pay

## 2024-06-21 ENCOUNTER — Inpatient Hospital Stay
Admission: EM | Admit: 2024-06-21 | Discharge: 2024-06-28 | DRG: 101 | Disposition: A | Discharge status: Skilled Nursing Facility | Attending: Obstetrics and Gynecology | Admitting: Obstetrics and Gynecology

## 2024-06-21 ENCOUNTER — Other Ambulatory Visit: Payer: Self-pay

## 2024-06-21 DIAGNOSIS — K219 Gastro-esophageal reflux disease without esophagitis: Secondary | ICD-10-CM | POA: Diagnosis present

## 2024-06-21 DIAGNOSIS — G4730 Sleep apnea, unspecified: Secondary | ICD-10-CM | POA: Diagnosis present

## 2024-06-21 DIAGNOSIS — Z6834 Body mass index (BMI) 34.0-34.9, adult: Secondary | ICD-10-CM

## 2024-06-21 DIAGNOSIS — E669 Obesity, unspecified: Secondary | ICD-10-CM | POA: Diagnosis present

## 2024-06-21 DIAGNOSIS — I10 Essential (primary) hypertension: Secondary | ICD-10-CM | POA: Diagnosis present

## 2024-06-21 DIAGNOSIS — Z79899 Other long term (current) drug therapy: Secondary | ICD-10-CM | POA: Diagnosis not present

## 2024-06-21 DIAGNOSIS — W19XXXA Unspecified fall, initial encounter: Secondary | ICD-10-CM | POA: Diagnosis present

## 2024-06-21 DIAGNOSIS — E7800 Pure hypercholesterolemia, unspecified: Secondary | ICD-10-CM | POA: Diagnosis present

## 2024-06-21 DIAGNOSIS — F17210 Nicotine dependence, cigarettes, uncomplicated: Secondary | ICD-10-CM | POA: Diagnosis present

## 2024-06-21 DIAGNOSIS — R000 Tachycardia, unspecified: Secondary | ICD-10-CM | POA: Diagnosis present

## 2024-06-21 DIAGNOSIS — F32A Depression, unspecified: Secondary | ICD-10-CM | POA: Diagnosis present

## 2024-06-21 DIAGNOSIS — Z743 Need for continuous supervision: Secondary | ICD-10-CM | POA: Diagnosis not present

## 2024-06-21 DIAGNOSIS — Z885 Allergy status to narcotic agent status: Secondary | ICD-10-CM

## 2024-06-21 DIAGNOSIS — R519 Headache, unspecified: Secondary | ICD-10-CM | POA: Diagnosis present

## 2024-06-21 DIAGNOSIS — I499 Cardiac arrhythmia, unspecified: Secondary | ICD-10-CM | POA: Diagnosis not present

## 2024-06-21 DIAGNOSIS — F419 Anxiety disorder, unspecified: Secondary | ICD-10-CM | POA: Diagnosis present

## 2024-06-21 DIAGNOSIS — M79632 Pain in left forearm: Secondary | ICD-10-CM | POA: Diagnosis present

## 2024-06-21 DIAGNOSIS — R0600 Dyspnea, unspecified: Secondary | ICD-10-CM | POA: Diagnosis not present

## 2024-06-21 DIAGNOSIS — R7989 Other specified abnormal findings of blood chemistry: Secondary | ICD-10-CM | POA: Diagnosis present

## 2024-06-21 DIAGNOSIS — F259 Schizoaffective disorder, unspecified: Secondary | ICD-10-CM | POA: Diagnosis present

## 2024-06-21 DIAGNOSIS — N179 Acute kidney failure, unspecified: Secondary | ICD-10-CM | POA: Diagnosis present

## 2024-06-21 DIAGNOSIS — I2510 Atherosclerotic heart disease of native coronary artery without angina pectoris: Secondary | ICD-10-CM | POA: Diagnosis present

## 2024-06-21 DIAGNOSIS — D72829 Elevated white blood cell count, unspecified: Secondary | ICD-10-CM | POA: Diagnosis present

## 2024-06-21 DIAGNOSIS — G40909 Epilepsy, unspecified, not intractable, without status epilepticus: Principal | ICD-10-CM | POA: Diagnosis present

## 2024-06-21 DIAGNOSIS — R569 Unspecified convulsions: Principal | ICD-10-CM

## 2024-06-21 DIAGNOSIS — R531 Weakness: Secondary | ICD-10-CM | POA: Diagnosis present

## 2024-06-21 DIAGNOSIS — I700 Atherosclerosis of aorta: Secondary | ICD-10-CM | POA: Diagnosis not present

## 2024-06-21 DIAGNOSIS — Z833 Family history of diabetes mellitus: Secondary | ICD-10-CM | POA: Diagnosis not present

## 2024-06-21 DIAGNOSIS — R4182 Altered mental status, unspecified: Secondary | ICD-10-CM

## 2024-06-21 DIAGNOSIS — I252 Old myocardial infarction: Secondary | ICD-10-CM | POA: Diagnosis not present

## 2024-06-21 DIAGNOSIS — R404 Transient alteration of awareness: Secondary | ICD-10-CM | POA: Diagnosis not present

## 2024-06-21 DIAGNOSIS — R918 Other nonspecific abnormal finding of lung field: Secondary | ICD-10-CM | POA: Diagnosis not present

## 2024-06-21 DIAGNOSIS — Z043 Encounter for examination and observation following other accident: Secondary | ICD-10-CM | POA: Diagnosis not present

## 2024-06-21 DIAGNOSIS — M19072 Primary osteoarthritis, left ankle and foot: Secondary | ICD-10-CM | POA: Diagnosis not present

## 2024-06-21 DIAGNOSIS — M47812 Spondylosis without myelopathy or radiculopathy, cervical region: Secondary | ICD-10-CM | POA: Diagnosis not present

## 2024-06-21 LAB — CBC WITH DIFFERENTIAL/PLATELET
Abs Immature Granulocytes: 0.06 K/uL (ref 0.00–0.07)
Basophils Absolute: 0 K/uL (ref 0.0–0.1)
Basophils Relative: 0 %
Eosinophils Absolute: 0 K/uL (ref 0.0–0.5)
Eosinophils Relative: 0 %
HCT: 38.5 % (ref 36.0–46.0)
Hemoglobin: 12.6 g/dL (ref 12.0–15.0)
Immature Granulocytes: 0 %
Lymphocytes Relative: 18 %
Lymphs Abs: 2.6 K/uL (ref 0.7–4.0)
MCH: 27.4 pg (ref 26.0–34.0)
MCHC: 32.7 g/dL (ref 30.0–36.0)
MCV: 83.7 fL (ref 80.0–100.0)
Monocytes Absolute: 0.9 K/uL (ref 0.1–1.0)
Monocytes Relative: 6 %
Neutro Abs: 11 K/uL — ABNORMAL HIGH (ref 1.7–7.7)
Neutrophils Relative %: 76 %
Platelets: 255 K/uL (ref 150–400)
RBC: 4.6 MIL/uL (ref 3.87–5.11)
RDW: 17 % — ABNORMAL HIGH (ref 11.5–15.5)
WBC: 14.6 K/uL — ABNORMAL HIGH (ref 4.0–10.5)
nRBC: 0 % (ref 0.0–0.2)

## 2024-06-21 MED ORDER — SODIUM CHLORIDE 0.9 % IV BOLUS
500.0000 mL | Freq: Once | INTRAVENOUS | Status: AC
  Administered 2024-06-21: 500 mL via INTRAVENOUS

## 2024-06-21 NOTE — ED Triage Notes (Addendum)
 Patient to ED via [facility] from home. Patient states the past 2 days she has had weakness, headache, and SOB. Patient was found on floor by neighbor with notable injury to the L arm, L leg and L foot.  Patient also has a bite mark on her lip. Patient's neighbor placed patient in bed and th patient told the neighbor not to call the ambulance. Patient later on decided to tell the neighbor to call EMS because she did not feel better. Patient is unable to give much history at this time.   EMS vitals: 198/105 97% room air  116 HR

## 2024-06-21 NOTE — ED Provider Notes (Signed)
 [facility] Provider Note    Event Date/Time   First MD Initiated Contact with Patient 06/21/24 2338     (approximate)   History   Seizures   HPI  Kelly [name] is a 58 y.o. female past medical history significant for seizure disorder, schizophrenia, hypertension, hyperlipidemia, anxiety, reported chronic left-sided weakness, presents to the emergency department with questionable seizure.  EMS stated that they were initially called out because the patient was found on the floor by neighbors and they were going to call EMS.  Later the neighbor stated that the patient walked over to their house and asked them to call the ambulance.  Unclear of the series of events.  Patient is unable to state exactly what happened or give a clear history.  States that she has not been feeling well for the past 2 or 3 days.  States that she has been having a headache, had a fall on the left side.  She is uncertain if she had a seizure but does state that she has a history of seizures.  When EMS arrived patient came from home.  Had a bite mark on her lip and some abrasions on the left side.  Able to find 1 neurology note that stated that she has 8-10 breakthrough seizures a year.  This scheduled Depakote  500 mg twice daily, lamotrigine  200 mg in the a.m., 300 mg p.m., Vimpat  200 mg twice daily.  I do find another note that later stated that she was not taking at least Depakote  or lamotrigine .  Patient states that she is compliant with all of her home medications but unable to state what seizure medications she takes.     Physical Exam   Triage Vital Signs: ED Triage Vitals [06/21/24 2342]  Encounter Vitals Group     BP      Girls Systolic BP Percentile      Girls Diastolic BP Percentile      Boys Systolic BP Percentile      Boys Diastolic BP Percentile      Pulse      Resp      Temp      Temp src      SpO2 97 %     Weight      Height      Head Circumference      Peak  Flow      Pain Score      Pain Loc      Pain Education      Exclude from Growth Chart     Most recent vital signs: Vitals:   06/21/24 2349 06/22/24 0130  BP: (!) 159/103 (!) 171/89  Pulse: (!) 103 97  Resp: 18 18  Temp: 98.4 F (36.9 C)   SpO2: 99% 100%    Physical Exam Constitutional:      Appearance: She is well-developed. She is ill-appearing.  HENT:     Head:     Comments: Dried blood to the lip Eyes:     Extraocular Movements: Extraocular movements intact.     Conjunctiva/sclera: Conjunctivae normal.     Pupils: Pupils are equal, round, and reactive to light.  Cardiovascular:     Rate and Rhythm: Regular rhythm.  Pulmonary:     Effort: No respiratory distress.  Abdominal:     General: There is no distension.     Tenderness: There is no abdominal tenderness.  Musculoskeletal:        General: Normal range of motion.  Cervical back: Normal range of motion. No tenderness.     Comments: No pain with midline cervical spine tenderness.  No pain with bilateral range of motion to bilateral arms or lower extremities.  No pain to bilateral hips.  Skin:    General: Skin is warm.     Capillary Refill: Capillary refill takes less than 2 seconds.     Comments: Abrasion to the left forearm on the left foot.  Neurological:     Mental Status: She is alert. Mental status is at baseline. She is disoriented.     Comments: Able to state her name but is uncertain of the year.  Weakness to the left hemibody.  Following simple commands.     IMPRESSION / MDM / ASSESSMENT AND PLAN / ED COURSE  I reviewed the triage vital signs and the nursing notes.  Broad differential diagnosis including breakthrough seizure, intracranial hemorrhage, CVA, electrolyte abnormality, infectious process, ACS EKG  I, Clotilda Punter, the attending physician, personally viewed and interpreted this ECG.   Rate: Normal  Rhythm: Normal sinus  Axis: Normal  Intervals: Normal  ST&T Change: None  No  tachycardic or bradycardic dysrhythmias while on cardiac telemetry.  RADIOLOGY CT scan of the head with no signs of intracranial hemorrhage.  CT scan of the cervical spine with no acute fracture.  Chest x-ray with no obvious pneumonia or rib fractures  X-ray of the left forearm was negative.  Left foot with questionable deformity to the base of the third metatarsal.   LABS (all labs ordered are listed, but only abnormal results are displayed) Labs interpreted as -    Labs Reviewed  CBC WITH DIFFERENTIAL/PLATELET - Abnormal; Notable for the following components:      Result Value   WBC 14.6 (*)    RDW 17.0 (*)    Neutro Abs 11.0 (*)    All other components within normal limits  COMPREHENSIVE METABOLIC PANEL WITH GFR - Abnormal; Notable for the following components:   Total Protein 8.4 (*)    All other components within normal limits  CK - Abnormal; Notable for the following components:   Total CK 402 (*)    All other components within normal limits  VALPROIC  ACID LEVEL - Abnormal; Notable for the following components:   Valproic  Acid Lvl 25 (*)    All other components within normal limits  TROPONIN I (HIGH SENSITIVITY) - Abnormal; Notable for the following components:   Troponin I (High Sensitivity) 34 (*)    All other components within normal limits  TROPONIN I (HIGH SENSITIVITY) - Abnormal; Notable for the following components:   Troponin I (High Sensitivity) 32 (*)    All other components within normal limits  MAGNESIUM  ETHANOL  URINALYSIS, W/ REFLEX TO CULTURE (INFECTION SUSPECTED)  URINE DRUG SCREEN, QUALITATIVE ([facility] ONLY)  LAMOTRIGINE  LEVEL     MDM  Unclear of the etiology of which bringing the patient in today, was found down by neighbors, unknown downtime and no witnessed seizure.  Uncertain of what the patient's baseline is.  Attempted to remove few prior notes but unclear what her baseline is.  I do not have any emergency contacts in the chart to get further  collateral information.  CT scan of the head and cervical spine with no acute fracture no signs of intracranial hemorrhage.  Valproic  acid level is low at 25.  Lamotrigine  level obtained but I believe is a send out lab.  Unable to get a level on her Vimpat .  Alcohol level is negative.  Leukocytosis of 14.6 but no obvious source of an infectious process.  Creatinine at baseline with no significant electrolyte abnormalities.  Troponin is elevated at 34 and 32 which is new when compared to prior, prior troponin was 6 and 5.  On reevaluation patient continues to be confused and altered from her baseline but is able to state her name and has an improving neurologic exam.  Still does not know the year and is unable to give details surrounding what happened tonight.  No signs of rhabdomyolysis.  Discussed with pharmacy and will give IV Depakote  750 mg given her low therapeutic levels.  Given that the patient was found down with elevated troponin, possible dysrhythmia versus a seizure.  Will consult hospitalist for observation for breakthrough seizure and has not returned to baseline.  Have very low suspicion for subclinical seizure.  Consulted hospitalist for admission.     PROCEDURES:  Critical Care performed: No  Procedures  Patient's presentation is most consistent with acute presentation with potential threat to life or bodily function.   MEDICATIONS ORDERED IN ED: Medications  acetaminophen  (TYLENOL ) tablet 1,000 mg (has no administration in time range)  valproate (DEPACON ) 750 mg in dextrose 5 % 50 mL IVPB (has no administration in time range)  sodium chloride  0.9 % bolus 500 mL (500 mLs Intravenous New Bag/Given 06/21/24 2356)    FINAL CLINICAL IMPRESSION(S) / ED DIAGNOSES   Final diagnoses:  Seizure (HCC)  Altered mental status, unspecified altered mental status type     Rx / DC Orders   ED Discharge Orders     None        Note:  This document was prepared using Dragon  voice recognition software and may include unintentional dictation errors.   Suzanne Kirsch, MD 06/22/24 [phone]

## 2024-06-22 ENCOUNTER — Emergency Department

## 2024-06-22 ENCOUNTER — Observation Stay

## 2024-06-22 ENCOUNTER — Encounter: Payer: Self-pay | Admitting: Internal Medicine

## 2024-06-22 ENCOUNTER — Other Ambulatory Visit: Payer: Self-pay

## 2024-06-22 DIAGNOSIS — F32A Depression, unspecified: Secondary | ICD-10-CM | POA: Diagnosis present

## 2024-06-22 DIAGNOSIS — I252 Old myocardial infarction: Secondary | ICD-10-CM | POA: Diagnosis not present

## 2024-06-22 DIAGNOSIS — Z833 Family history of diabetes mellitus: Secondary | ICD-10-CM | POA: Diagnosis not present

## 2024-06-22 DIAGNOSIS — G43909 Migraine, unspecified, not intractable, without status migrainosus: Secondary | ICD-10-CM | POA: Diagnosis not present

## 2024-06-22 DIAGNOSIS — G40909 Epilepsy, unspecified, not intractable, without status epilepticus: Secondary | ICD-10-CM | POA: Diagnosis present

## 2024-06-22 DIAGNOSIS — R519 Headache, unspecified: Secondary | ICD-10-CM | POA: Diagnosis present

## 2024-06-22 DIAGNOSIS — Z79899 Other long term (current) drug therapy: Secondary | ICD-10-CM | POA: Diagnosis not present

## 2024-06-22 DIAGNOSIS — E785 Hyperlipidemia, unspecified: Secondary | ICD-10-CM | POA: Diagnosis not present

## 2024-06-22 DIAGNOSIS — F17210 Nicotine dependence, cigarettes, uncomplicated: Secondary | ICD-10-CM | POA: Diagnosis present

## 2024-06-22 DIAGNOSIS — R000 Tachycardia, unspecified: Secondary | ICD-10-CM | POA: Diagnosis present

## 2024-06-22 DIAGNOSIS — R251 Tremor, unspecified: Secondary | ICD-10-CM | POA: Diagnosis not present

## 2024-06-22 DIAGNOSIS — W19XXXA Unspecified fall, initial encounter: Secondary | ICD-10-CM | POA: Diagnosis present

## 2024-06-22 DIAGNOSIS — K219 Gastro-esophageal reflux disease without esophagitis: Secondary | ICD-10-CM | POA: Diagnosis present

## 2024-06-22 DIAGNOSIS — N179 Acute kidney failure, unspecified: Secondary | ICD-10-CM | POA: Diagnosis present

## 2024-06-22 DIAGNOSIS — R569 Unspecified convulsions: Secondary | ICD-10-CM | POA: Diagnosis present

## 2024-06-22 DIAGNOSIS — M79632 Pain in left forearm: Secondary | ICD-10-CM | POA: Diagnosis present

## 2024-06-22 DIAGNOSIS — I10 Essential (primary) hypertension: Secondary | ICD-10-CM | POA: Diagnosis present

## 2024-06-22 DIAGNOSIS — R296 Repeated falls: Secondary | ICD-10-CM | POA: Diagnosis not present

## 2024-06-22 DIAGNOSIS — F419 Anxiety disorder, unspecified: Secondary | ICD-10-CM | POA: Diagnosis present

## 2024-06-22 DIAGNOSIS — E7800 Pure hypercholesterolemia, unspecified: Secondary | ICD-10-CM | POA: Diagnosis present

## 2024-06-22 DIAGNOSIS — R7989 Other specified abnormal findings of blood chemistry: Secondary | ICD-10-CM | POA: Diagnosis present

## 2024-06-22 DIAGNOSIS — M6281 Muscle weakness (generalized): Secondary | ICD-10-CM | POA: Diagnosis not present

## 2024-06-22 DIAGNOSIS — I2510 Atherosclerotic heart disease of native coronary artery without angina pectoris: Secondary | ICD-10-CM | POA: Diagnosis present

## 2024-06-22 DIAGNOSIS — J4520 Mild intermittent asthma, uncomplicated: Secondary | ICD-10-CM | POA: Diagnosis not present

## 2024-06-22 DIAGNOSIS — G4730 Sleep apnea, unspecified: Secondary | ICD-10-CM | POA: Diagnosis present

## 2024-06-22 DIAGNOSIS — R531 Weakness: Secondary | ICD-10-CM | POA: Diagnosis present

## 2024-06-22 DIAGNOSIS — Z6834 Body mass index (BMI) 34.0-34.9, adult: Secondary | ICD-10-CM | POA: Diagnosis not present

## 2024-06-22 DIAGNOSIS — D72829 Elevated white blood cell count, unspecified: Secondary | ICD-10-CM | POA: Diagnosis present

## 2024-06-22 DIAGNOSIS — F259 Schizoaffective disorder, unspecified: Secondary | ICD-10-CM | POA: Diagnosis present

## 2024-06-22 DIAGNOSIS — Z885 Allergy status to narcotic agent status: Secondary | ICD-10-CM | POA: Diagnosis not present

## 2024-06-22 DIAGNOSIS — E669 Obesity, unspecified: Secondary | ICD-10-CM | POA: Diagnosis present

## 2024-06-22 LAB — COMPREHENSIVE METABOLIC PANEL WITH GFR
ALT: 19 U/L (ref 0–44)
AST: 33 U/L (ref 15–41)
Albumin: 4.1 g/dL (ref 3.5–5.0)
Alkaline Phosphatase: 86 U/L (ref 38–126)
Anion gap: 14 (ref 5–15)
BUN: 20 mg/dL (ref 6–20)
CO2: 25 mmol/L (ref 22–32)
Calcium: 9.6 mg/dL (ref 8.9–10.3)
Chloride: 101 mmol/L (ref 98–111)
Creatinine, Ser: 0.89 mg/dL (ref 0.44–1.00)
GFR, Estimated: >60 mL/min (ref >60)
Glucose, Bld: 95 mg/dL (ref 70–99)
Potassium: 4 mmol/L (ref 3.5–5.1)
Sodium: 140 mmol/L (ref 135–145)
Total Bilirubin: 1.1 mg/dL (ref 0.0–1.2)
Total Protein: 8.4 g/dL — ABNORMAL HIGH (ref 6.5–8.1)

## 2024-06-22 LAB — TROPONIN I (HIGH SENSITIVITY)
Troponin I (High Sensitivity): 32 ng/L — ABNORMAL HIGH (ref <18)
Troponin I (High Sensitivity): 34 ng/L — ABNORMAL HIGH (ref <18)

## 2024-06-22 LAB — ETHANOL: Alcohol, Ethyl (B): <15 mg/dL (ref <15)

## 2024-06-22 LAB — VALPROIC ACID LEVEL: Valproic Acid Lvl: 25 ug/mL — ABNORMAL LOW (ref 50–100)

## 2024-06-22 LAB — CK: Total CK: 402 U/L — ABNORMAL HIGH (ref 38–234)

## 2024-06-22 LAB — MAGNESIUM: Magnesium: 2.2 mg/dL (ref 1.7–2.4)

## 2024-06-22 MED ORDER — ONDANSETRON HCL 4 MG/2ML IJ SOLN
4.0000 mg | Freq: Four times a day (QID) | INTRAMUSCULAR | Status: DC | PRN
  Administered 2024-06-24: 4 mg via INTRAVENOUS
  Filled 2024-06-22: qty 2

## 2024-06-22 MED ORDER — CLONIDINE HCL 0.1 MG PO TABS
0.1000 mg | ORAL_TABLET | Freq: Every day | ORAL | Status: DC
  Administered 2024-06-22 – 2024-06-28 (×10): 0.1 mg via ORAL
  Filled 2024-06-22 (×7): qty 1

## 2024-06-22 MED ORDER — SODIUM CHLORIDE 0.9 % IV SOLN
75.0000 mL/h | INTRAVENOUS | Status: AC
  Administered 2024-06-22: 75 mL/h via INTRAVENOUS

## 2024-06-22 MED ORDER — MONTELUKAST SODIUM 10 MG PO TABS
10.0000 mg | ORAL_TABLET | Freq: Every day | ORAL | Status: DC
  Administered 2024-06-22 – 2024-06-27 (×8): 10 mg via ORAL
  Filled 2024-06-22 (×6): qty 1

## 2024-06-22 MED ORDER — GABAPENTIN 300 MG PO CAPS
900.0000 mg | ORAL_CAPSULE | Freq: Every day | ORAL | Status: DC
  Administered 2024-06-22 – 2024-06-27 (×8): 900 mg via ORAL
  Filled 2024-06-22 (×6): qty 3

## 2024-06-22 MED ORDER — ORAL CARE MOUTH RINSE: 15.0000 mL | OROMUCOSAL | Status: DC | PRN

## 2024-06-22 MED ORDER — RISPERIDONE 0.5 MG PO TABS
2.0000 mg | ORAL_TABLET | Freq: Every day | ORAL | Status: DC
  Administered 2024-06-22 – 2024-06-27 (×8): 2 mg via ORAL
  Filled 2024-06-22 (×6): qty 4

## 2024-06-22 MED ORDER — DIVALPROEX SODIUM 500 MG PO DR TAB
500.0000 mg | DELAYED_RELEASE_TABLET | Freq: Two times a day (BID) | ORAL | Status: DC
  Administered 2024-06-22 – 2024-06-28 (×18): 500 mg via ORAL
  Filled 2024-06-22 (×13): qty 1

## 2024-06-22 MED ORDER — ACETAMINOPHEN 325 MG PO TABS
650.0000 mg | ORAL_TABLET | Freq: Four times a day (QID) | ORAL | Status: DC | PRN
  Administered 2024-06-22 – 2024-06-24 (×3): 650 mg via ORAL
  Filled 2024-06-22 (×3): qty 2

## 2024-06-22 MED ORDER — ATORVASTATIN CALCIUM 20 MG PO TABS
40.0000 mg | ORAL_TABLET | Freq: Every day | ORAL | Status: DC
  Administered 2024-06-22 – 2024-06-27 (×8): 40 mg via ORAL
  Filled 2024-06-22 (×6): qty 2

## 2024-06-22 MED ORDER — LOSARTAN POTASSIUM 25 MG PO TABS
25.0000 mg | ORAL_TABLET | Freq: Every day | ORAL | Status: DC
  Administered 2024-06-22 – 2024-06-23 (×2): 25 mg via ORAL
  Filled 2024-06-22 (×2): qty 1

## 2024-06-22 MED ORDER — VALPROATE SODIUM 100 MG/ML IV SOLN
750.0000 mg | Freq: Once | INTRAVENOUS | Status: AC
  Administered 2024-06-22: 750 mg via INTRAVENOUS
  Filled 2024-06-22: qty 7.5

## 2024-06-22 MED ORDER — QUETIAPINE FUMARATE 25 MG PO TABS
25.0000 mg | ORAL_TABLET | Freq: Two times a day (BID) | ORAL | Status: DC
  Administered 2024-06-22 – 2024-06-24 (×5): 25 mg via ORAL
  Filled 2024-06-22 (×7): qty 1

## 2024-06-22 MED ORDER — LAMOTRIGINE 100 MG PO TABS
200.0000 mg | ORAL_TABLET | Freq: Two times a day (BID) | ORAL | Status: DC
  Administered 2024-06-22 – 2024-06-28 (×18): 200 mg via ORAL
  Filled 2024-06-22 (×13): qty 2

## 2024-06-22 MED ORDER — ORAL CARE MOUTH RINSE
15.0000 mL | OROMUCOSAL | Status: DC
  Administered 2024-06-22 – 2024-06-25 (×22): 15 mL via OROMUCOSAL
  Filled 2024-06-22 (×6): qty 15

## 2024-06-22 MED ORDER — GABAPENTIN 300 MG PO CAPS: 300.0000 mg | ORAL_CAPSULE | Freq: Two times a day (BID) | ORAL | Status: DC

## 2024-06-22 MED ORDER — LACOSAMIDE 50 MG PO TABS
200.0000 mg | ORAL_TABLET | Freq: Two times a day (BID) | ORAL | Status: DC
  Administered 2024-06-22 – 2024-06-28 (×18): 200 mg via ORAL
  Filled 2024-06-22 (×13): qty 4

## 2024-06-22 MED ORDER — ACETAMINOPHEN 500 MG PO TABS
1000.0000 mg | ORAL_TABLET | Freq: Once | ORAL | Status: AC
  Administered 2024-06-22: 1000 mg via ORAL
  Filled 2024-06-22: qty 2

## 2024-06-22 MED ORDER — ALBUTEROL SULFATE (2.5 MG/3ML) 0.083% IN NEBU: 2.5000 mg | INHALATION_SOLUTION | Freq: Four times a day (QID) | RESPIRATORY_TRACT | Status: DC | PRN

## 2024-06-22 MED ORDER — ENOXAPARIN SODIUM 60 MG/0.6ML IJ SOSY
0.5000 mg/kg | PREFILLED_SYRINGE | Freq: Every day | INTRAMUSCULAR | Status: DC
  Administered 2024-06-22 – 2024-06-27 (×8): 50 mg via SUBCUTANEOUS
  Filled 2024-06-22 (×6): qty 0.6

## 2024-06-22 MED ORDER — AMLODIPINE BESYLATE 5 MG PO TABS
5.0000 mg | ORAL_TABLET | Freq: Every day | ORAL | Status: DC
  Administered 2024-06-22 – 2024-06-28 (×10): 5 mg via ORAL
  Filled 2024-06-22 (×7): qty 1

## 2024-06-22 MED ORDER — GABAPENTIN 300 MG PO CAPS
300.0000 mg | ORAL_CAPSULE | Freq: Every morning | ORAL | Status: DC
  Administered 2024-06-22 – 2024-06-28 (×10): 300 mg via ORAL
  Filled 2024-06-22 (×7): qty 1

## 2024-06-22 MED ORDER — NORTRIPTYLINE HCL 25 MG PO CAPS
50.0000 mg | ORAL_CAPSULE | Freq: Every day | ORAL | Status: DC
  Administered 2024-06-22 – 2024-06-27 (×8): 50 mg via ORAL
  Filled 2024-06-22 (×6): qty 2

## 2024-06-22 MED ORDER — PANTOPRAZOLE SODIUM 40 MG PO TBEC
40.0000 mg | DELAYED_RELEASE_TABLET | Freq: Every day | ORAL | Status: DC
  Administered 2024-06-22 – 2024-06-28 (×10): 40 mg via ORAL
  Filled 2024-06-22 (×7): qty 1

## 2024-06-22 NOTE — Procedures (Signed)
 Patient Name: Kelly [name]  MRN: [id number]  Epilepsy Attending: Arlin MALVA Krebs  Referring Physician/Provider: Laurita Cort DASEN, MD  Date: 06/22/2024 Duration: 28.34 mins  Patient history:  58 y.o. female with medical history significant of seizure, HTN, CAD MI, anxiety/depression, presented with seizure. EEG to evaluate for seizure  Level of alertness: Awake  AEDs during EEG study: GBP, VPA  Technical aspects: This EEG study was done with scalp electrodes positioned according to the 10-20 International system of electrode placement. Electrical activity was reviewed with band pass filter of 1-70Hz , sensitivity of 7 uV/mm, display speed of 44mm/sec with a 60Hz  notched filter applied as appropriate. EEG data were recorded continuously and digitally stored.  Video monitoring was available and reviewed as appropriate.  Description: The posterior dominant rhythm consists of 8 Hz activity of moderate voltage (25-35 uV) seen predominantly in posterior head regions, symmetric and reactive to eye opening and eye closing. Hyperventilation and photic stimulation were not performed.     IMPRESSION: This study is within normal limits. No seizures or epileptiform discharges were seen throughout the recording.  A normal interictal EEG does not exclude the diagnosis of epilepsy.   Layton Naves O Caylynn Minchew

## 2024-06-22 NOTE — ED Notes (Signed)
Patient placed on a purewick.  

## 2024-06-22 NOTE — Progress Notes (Signed)
 Eeg done

## 2024-06-22 NOTE — H&P (Signed)
 History and Physical    Kelly [name] FMW:[id number] DOB: 03/09/1966 DOA: 06/21/2024  PCP: Wellington Curtis LABOR, FNP (Confirm with patient/family/NH records and if not entered, this has to be entered at [facility] point of entry) Patient coming from: Home  I have personally briefly reviewed patient's old medical records in [facility]  Chief Complaint: Seizure  HPI: Kelly [name] is a 58 y.o. female with medical history significant of seizure, HTN, CAD MI, anxiety/depression, presented with seizure.  Patient lives by herself, with neighbors visit her on daily basis.  She has a seizure disorder but appears to be poorly controlled, as outpatient record showed that she had two episode of seizure in July of this year.  Patient insisted that she has been compliant with her seizure medications.  This time, patient started to have malaise, nausea 2 days ago, as a result, she has had decreased oral intake but she insists that she has been taking all her medications.  Yesterday evening, her neighbor went to see her and found the patient on the floor unresponsive, patient however did not remember what had happened before her neighbor arrived.  But she did have a lip bite mark but denied any urine or bowel movement incontinence.  Patient complained about left forearm pain, which she attributed to possible fall yesterday evening.  Denied any chest pain shortness of breath abdominal pain diarrhea fever or chills. ED Course: Afebrile, blood and tachycardia blood pressure 150/80 O2 saturation 99% on room air.  CT head and neck negative for intracranial abnormalities or fracture or traumatic findings on cervical spine CT.  Blood work showed lower than normal valproic  acid level, BUN 20 creatinine 0.8 WBC 14.6 hemoglobin 12.8.  Patient was given IV loading 750 mg valproate  Review of Systems: As per HPI otherwise 14 point review of systems negative.    Past Medical History:  Diagnosis Date   Acid reflux    Anxiety     Depression    Hypercholesteremia    Hypertension    Myocardial infarction (HCC)    Schizophrenia (HCC)    Seizures (HCC)    Sleep apnea     Past Surgical History:  Procedure Laterality Date   CARDIAC CATHETERIZATION     CESAREAN SECTION     DILATION AND CURETTAGE OF UTERUS     FOOT SURGERY     TONSILLECTOMY       reports that she has been smoking cigarettes. She started smoking about 23 years ago. She has quit using smokeless tobacco. She reports current alcohol use. She reports that she does not currently use drugs after having used the following drugs: Marijuana.  Allergies  Allergen Reactions   Percocet [Oxycodone -Acetaminophen ] Nausea And Vomiting    Family History  Problem Relation Age of Onset   Breast cancer Mother    Arthritis Mother    Other Father        old age   Diabetes Maternal Aunt    Ovarian cancer Neg Hx    Colon cancer Neg Hx      Prior to Admission medications   Medication Sig Start Date End Date Taking? Authorizing Provider  amLODipine  (NORVASC ) 5 MG tablet Take 1 tablet (5 mg total) by mouth daily. 05/04/24  Yes Wellington Curtis A, FNP  atorvastatin  (LIPITOR) 40 MG tablet Take 1 tablet (40 mg total) by mouth at bedtime. 02/14/24  Yes Clifton, Kellie A, FNP  cloNIDine  (CATAPRES ) 0.1 MG tablet Take 1 tablet (0.1 mg total) by mouth daily. 02/14/24  Yes Wellington Beams A, FNP  divalproex  (DEPAKOTE ) 500 MG DR tablet Take 500 mg by mouth 2 (two) times daily. 11/30/22  Yes [provider]  fluticasone  (FLONASE ) 50 MCG/ACT nasal spray Place 1 spray into both nostrils 2 (two) times daily. 02/05/23  Yes [provider]  gabapentin  (NEURONTIN ) 300 MG capsule Take 300-900 mg by mouth 2 (two) times daily. Take one capsule (300mg ) in the morning and three capsules (900mg ) at night   Yes [provider]  lamoTRIgine  (LAMICTAL ) 200 MG tablet Take 1 tablet (200 mg total) by mouth 2 (two) times daily. 07/13/18  Yes Sherial Bail, MD   losartan  (COZAAR ) 25 MG tablet Take 1 tablet (25 mg total) by mouth daily. 05/04/24  Yes Clifton, Kellie A, FNP  montelukast  (SINGULAIR ) 10 MG tablet Take 1 tablet (10 mg total) by mouth at bedtime. 05/04/24  Yes Wellington Beams A, FNP  nortriptyline  (PAMELOR ) 50 MG capsule Take 50 mg by mouth at bedtime. 11/27/19  Yes [provider]  omeprazole (PRILOSEC) 40 MG capsule Take 40 mg by mouth daily. 11/10/19  Yes [provider]  QUEtiapine  (SEROQUEL ) 25 MG tablet Take 25 mg by mouth 2 (two) times daily. 03/03/24  Yes [provider]  risperiDONE  (RISPERDAL ) 2 MG tablet Take 2 mg by mouth at bedtime. 09/01/23  Yes [provider]  VENTOLIN  HFA 108 (90 Base) MCG/ACT inhaler Inhale 2 puffs into the lungs every 6 (six) hours as needed. 12/20/23  Yes Wellington Beams A, FNP  VIMPAT  200 MG TABS tablet Take 200 mg by mouth 2 (two) times daily.  02/12/16  Yes [provider]    Physical Exam: Vitals:   06/22/24 0430 06/22/24 0435 06/22/24 0730 06/22/24 0800  BP: 139/85  135/74 (!) 152/86  Pulse: 97  89 100  Resp: 18  14 16   Temp:  98.5 F (36.9 C)    TempSrc:  Oral    SpO2: 99%  99% 99%  Weight:      Height:        Constitutional: NAD, calm, comfortable Vitals:   06/22/24 0430 06/22/24 0435 06/22/24 0730 06/22/24 0800  BP: 139/85  135/74 (!) 152/86  Pulse: 97  89 100  Resp: 18  14 16   Temp:  98.5 F (36.9 C)    TempSrc:  Oral    SpO2: 99%  99% 99%  Weight:      Height:       Eyes: PERRL, lids and conjunctivae normal ENMT: Mucous membranes are moist. Posterior pharynx clear of any exudate or lesions.Normal dentition.  Neck: normal, supple, no masses, no thyromegaly Respiratory: clear to auscultation bilaterally, no wheezing, no crackles. Normal respiratory effort. No accessory muscle use.  Cardiovascular: Regular rate and rhythm, no murmurs / rubs / gallops. No extremity edema. 2+ pedal pulses. No carotid bruits.  Abdomen: no tenderness, no  masses palpated. No hepatosplenomegaly. Bowel sounds positive.  Musculoskeletal: no clubbing / cyanosis. No joint deformity upper and lower extremities. Good ROM, no contractures. Normal muscle tone.  Skin: no rashes, lesions, ulcers. No induration Neurologic: CN 2-12 grossly intact. Sensation intact, DTR normal. Strength 5/5 in all 4.  Psychiatric: Normal judgment and insight. Alert and oriented x 3. Normal mood.     Labs on Admission: I have personally reviewed following labs and imaging studies  CBC: Recent Labs  Lab 06/21/24 2346  WBC 14.6*  NEUTROABS 11.0*  HGB 12.6  HCT 38.5  MCV 83.7  PLT 255   Basic Metabolic Panel:  Recent Labs  Lab 06/21/24 2346  NA 140  K 4.0  CL 101  CO2 25  GLUCOSE 95  BUN 20  CREATININE 0.89  CALCIUM  9.6  MG 2.2   GFR: Estimated Creatinine Clearance: 84.1 mL/min (by C-G formula based on SCr of 0.89 mg/dL). Liver Function Tests: Recent Labs  Lab 06/21/24 2346  AST 33  ALT 19  ALKPHOS 86  BILITOT 1.1  PROT 8.4*  ALBUMIN 4.1   No results for input(s): LIPASE, AMYLASE in the last 168 hours. No results for input(s): AMMONIA in the last 168 hours. Coagulation Profile: No results for input(s): INR, PROTIME in the last 168 hours. Cardiac Enzymes: Recent Labs  Lab 06/21/24 2346  CKTOTAL 402*   BNP (last 3 results) No results for input(s): PROBNP in the last 8760 hours. HbA1C: No results for input(s): HGBA1C in the last 72 hours. CBG: No results for input(s): GLUCAP in the last 168 hours. Lipid Profile: No results for input(s): CHOL, HDL, LDLCALC, TRIG, CHOLHDL, LDLDIRECT in the last 72 hours. Thyroid  Function Tests: No results for input(s): TSH, T4TOTAL, FREET4, T3FREE, THYROIDAB in the last 72 hours. Anemia Panel: No results for input(s): VITAMINB12, FOLATE, FERRITIN, TIBC, IRON, RETICCTPCT in the last 72 hours. Urine analysis:    Component Value Date/Time   COLORURINE  STRAW (A) 03/09/2023 0150   APPEARANCEUR CLEAR (A) 03/09/2023 0150   LABSPEC 1.006 03/09/2023 0150   PHURINE 7.0 03/09/2023 0150   GLUCOSEU NEGATIVE 03/09/2023 0150   HGBUR NEGATIVE 03/09/2023 0150   BILIRUBINUR NEGATIVE 03/09/2023 0150   KETONESUR NEGATIVE 03/09/2023 0150   PROTEINUR NEGATIVE 03/09/2023 0150   NITRITE NEGATIVE 03/09/2023 0150   LEUKOCYTESUR NEGATIVE 03/09/2023 0150    Radiological Exams on Admission: DG Foot 2 Views Left Result Date: 06/22/2024 CLINICAL DATA:  Fall EXAM: LEFT FOOT - 2 VIEW COMPARISON:  None Available. FINDINGS: No malalignment. Questionable lucency at the base of the third metatarsal. Degenerative changes at the first MTP joint. IMPRESSION: Questionable minimal lucency and deformity at the base of the third metatarsal, correlate for point tenderness Electronically Signed   By: Luke Bun M.D.   On: 06/22/2024 01:47   DG Forearm Left Result Date: 06/22/2024 CLINICAL DATA:  Fall EXAM: LEFT FOREARM - 2 VIEW COMPARISON:  05/05/2012 FINDINGS: There is no evidence of fracture or other focal bone lesions. Soft tissues are unremarkable. IMPRESSION: Negative. Electronically Signed   By: Luke Bun M.D.   On: 06/22/2024 01:45   DG Chest Portable 1 View Result Date: 06/22/2024 CLINICAL DATA:  Fall, dyspnea EXAM: PORTABLE CHEST 1 VIEW COMPARISON:  03/09/2023 FINDINGS: Mild hypoventilatory changes. Atelectasis or scarring in the left lower lung. Cardiomediastinal silhouette within normal limits. Aortic atherosclerosis IMPRESSION: No active disease. Atelectasis or scarring in the left lower lung. Electronically Signed   By: Luke Bun M.D.   On: 06/22/2024 01:44   CT Head Wo Contrast Result Date: 06/22/2024 CLINICAL DATA:  Mental status change, unknown cause Neuro deficit, acute, stroke suspected seizure, L sided weakness, ams, found down, ? trauma; Neck trauma, intoxicated or obtunded (Age >= 16y) EXAM: CT HEAD WITHOUT CONTRAST CT CERVICAL SPINE WITHOUT CONTRAST  TECHNIQUE: Multidetector CT imaging of the head and cervical spine was performed following the standard protocol without intravenous contrast. Multiplanar CT image reconstructions of the cervical spine were also generated. RADIATION DOSE REDUCTION: This exam was performed according to the departmental dose-optimization program which includes automated exposure control, adjustment of the mA and/or kV according to patient size and/or use  of iterative reconstruction technique. COMPARISON:  CT head and CT cervical spine 11/26/2021. FINDINGS: Mildly motion limited study.  Within this limitation: CT HEAD FINDINGS Brain: No evidence of acute infarction, hemorrhage, hydrocephalus, extra-axial collection or mass lesion/mass effect. Vascular: No hyperdense vessel. Skull: No acute fracture. Sinuses/Orbits: No acute finding. CT CERVICAL SPINE FINDINGS Alignment: Unchanged alignment. No substantial sagittal subluxation. Skull base and vertebrae: No evidence of acute fracture. Vertebral body heights are maintained. Soft tissues and spinal canal: No prevertebral fluid or swelling. No visible canal hematoma. Disc levels: Similar multilevel degenerative change. Upper chest: Lung apices are clear. IMPRESSION: 1. No evidence of acute intracranial abnormality. 2. No evidence of acute fracture or traumatic malalignment in the cervical spine. Electronically Signed   By: Gilmore GORMAN Molt M.D.   On: 06/22/2024 00:25   CT Cervical Spine Wo Contrast Result Date: 06/22/2024 CLINICAL DATA:  Mental status change, unknown cause Neuro deficit, acute, stroke suspected seizure, L sided weakness, ams, found down, ? trauma; Neck trauma, intoxicated or obtunded (Age >= 16y) EXAM: CT HEAD WITHOUT CONTRAST CT CERVICAL SPINE WITHOUT CONTRAST TECHNIQUE: Multidetector CT imaging of the head and cervical spine was performed following the standard protocol without intravenous contrast. Multiplanar CT image reconstructions of the cervical spine were also  generated. RADIATION DOSE REDUCTION: This exam was performed according to the departmental dose-optimization program which includes automated exposure control, adjustment of the mA and/or kV according to patient size and/or use of iterative reconstruction technique. COMPARISON:  CT head and CT cervical spine 11/26/2021. FINDINGS: Mildly motion limited study.  Within this limitation: CT HEAD FINDINGS Brain: No evidence of acute infarction, hemorrhage, hydrocephalus, extra-axial collection or mass lesion/mass effect. Vascular: No hyperdense vessel. Skull: No acute fracture. Sinuses/Orbits: No acute finding. CT CERVICAL SPINE FINDINGS Alignment: Unchanged alignment. No substantial sagittal subluxation. Skull base and vertebrae: No evidence of acute fracture. Vertebral body heights are maintained. Soft tissues and spinal canal: No prevertebral fluid or swelling. No visible canal hematoma. Disc levels: Similar multilevel degenerative change. Upper chest: Lung apices are clear. IMPRESSION: 1. No evidence of acute intracranial abnormality. 2. No evidence of acute fracture or traumatic malalignment in the cervical spine. Electronically Signed   By: Gilmore GORMAN Molt M.D.   On: 06/22/2024 00:25    EKG: Independently reviewed.  Sinus rhythm, no acute ST-T changes.  Assessment/Plan Principal Problem:   Seizure [facility]) Active Problems:   Seizures (HCC)  (please populate well all problems here in Problem List. (For example, if patient is on BP meds at home and you resume or decide to hold them, it is a problem that needs to be her. Same for CAD, COPD, HLD and so on)  Seizure likely secondary to noncoherent with seizure medications - Evidenced by lower than normal valproic  acid level - Resume home antiseizure medication including Depakote , Vimpat , Lamictal  and gabapentin  - EEG, UDS - Telemonitoring x 24 hours - Other DDx, will check orthostatic vital signs and keep patient on telemonitoring, overall low suspicion  for syncope.  Leukocytosis - Probably secondary to seizure, patient has no cough and no acute infiltrates on x-ray and UA showed no signs of UTI, plan to hold off antibiotics.  Anxiety/depression - Mentation at baseline -Continue Risperdal  and Seroquel   HTN -Stable, continue home BP regimen including clonidine , amlodipine , losartan   Obesity - BMI= 34, calorie control recommended  Total time spent on patient care 75 minutes  DVT prophylaxis: Lovenox  Code Status: Full code Family Communication: None at bedside Disposition Plan: Expect less than 2 midnight  hospital Consults called: None Admission status: Telemetry observation   Cort ONEIDA Mana MD [facility] Pager [phone]  06/22/2024, 8:59 AM

## 2024-06-22 NOTE — ED Notes (Signed)
 CCMD contacted to place pt on cardiac monitoring

## 2024-06-23 DIAGNOSIS — R569 Unspecified convulsions: Secondary | ICD-10-CM | POA: Diagnosis not present

## 2024-06-23 LAB — CBC
HCT: 33.8 % — ABNORMAL LOW (ref 36.0–46.0)
Hemoglobin: 10.8 g/dL — ABNORMAL LOW (ref 12.0–15.0)
MCH: 27.1 pg (ref 26.0–34.0)
MCHC: 32 g/dL (ref 30.0–36.0)
MCV: 84.7 fL (ref 80.0–100.0)
Platelets: 221 K/uL (ref 150–400)
RBC: 3.99 MIL/uL (ref 3.87–5.11)
RDW: 17.2 % — ABNORMAL HIGH (ref 11.5–15.5)
WBC: 11.8 K/uL — ABNORMAL HIGH (ref 4.0–10.5)
nRBC: 0 % (ref 0.0–0.2)

## 2024-06-23 LAB — HIV ANTIBODY (ROUTINE TESTING W REFLEX): HIV Screen 4th Generation wRfx: NONREACTIVE

## 2024-06-23 LAB — LAMOTRIGINE LEVEL: Lamotrigine Lvl: 25.7 ug/mL — ABNORMAL HIGH (ref 2.0–20.0)

## 2024-06-23 NOTE — Evaluation (Signed)
 Occupational Therapy Evaluation Patient Details Name: Kelly [name] MRN: [id number] DOB: April 30, 1966 Today's Date: 06/23/2024   History of Present Illness   Kelly [name] is a 58 y.o. female with medical history significant of seizure, HTN, CAD MI, anxiety/depression, presented with seizure.     Clinical Impressions Pt was seen for OT evaluation this date. Prior to hospital admission, pt was living by herself in an apartment and notes she has a neighbor who comes to stop by and check on her. She denies issues with ADL/IADL and uses SPC. No family present to verify. Pt questionable historian given level of alertness. Pt presents with deficits in strength, coordination, balance, and cognition, affecting safe and optimal ADL completion. Pt currently requires supv for bed mobility, MOD A for seated LB dressing as pt demo's difficulty maintaining balance and unable to keep foot over opposite knee in figure 4 technique. Pt noted with trouble with expressing herself, new as of admission but not new as of session per RN and MD. Unable to progress to standing 2/2 pt's alertness and lightheadedness. RN notified. BP 130/70. Pt would benefit from skilled OT services to address noted impairments and functional limitations (see below for any additional details) in order to maximize safety and independence while minimizing falls risk and caregiver burden. Anticipate the need for follow up OT services upon acute hospital DC.    If plan is discharge home, recommend the following:   A lot of help with walking and/or transfers;A lot of help with bathing/dressing/bathroom;Direct supervision/assist for medications management;Supervision due to cognitive status;Direct supervision/assist for financial management;Assist for transportation;Assistance with cooking/housework;Help with stairs or ramp for entrance     Functional Status Assessment   Patient has had a recent decline in their functional status and  demonstrates the ability to make significant improvements in function in a reasonable and predictable amount of time.     Equipment Recommendations   Other (comment) (defer)     Recommendations for Other Services         Precautions/Restrictions   Precautions Precautions: Fall Precaution/Restrictions Comments: hx of seizure disorder Restrictions Weight Bearing Restrictions Per Provider Order: No     Mobility Bed Mobility Overal bed mobility: Modified Independent     Transfers      General transfer comment: unable due to cognition, less alert with sitting thus returned to supine      Balance Overall balance assessment: Needs assistance Sitting-balance support: Bilateral upper extremity supported, Feet supported Sitting balance-Leahy Scale: Poor Sitting balance - Comments: posterior leaning Postural control: Posterior lean       ADL either performed or assessed with clinical judgement   ADL Overall ADL's : Needs assistance/impaired Eating/Feeding: NPO    Upper Body Dressing : Minimal assistance;Moderate assistance;Bed level;Sitting   Lower Body Dressing: Moderate assistance;Sitting/lateral leans Lower Body Dressing Details (indicate cue type and reason): Pt unable to keep ankle crossed over opposite knee to assist in reaching her feet to don socks requiring MOD A to complete         Pertinent Vitals/Pain Pain Assessment Pain Assessment: No/denies pain     Extremity/Trunk Assessment Upper Extremity Assessment Upper Extremity Assessment: Generalized weakness;Difficult to assess due to impaired cognition   Lower Extremity Assessment Lower Extremity Assessment: Generalized weakness;Difficult to assess due to impaired cognition       Communication Communication Communication: Impaired Factors Affecting Communication: Difficulty expressing self;Reduced clarity of speech   Cognition Arousal: Lethargic Behavior During Therapy: Impulsive Cognition:  No family/caregiver present to determine baseline  OT - Cognition Comments: Pt appears to demo improved alertness with constant verbal input, worsens with sitting and pt reporting lightheadedness (BP 130/70, HR 77-90's), impaired processing, safety      Following commands: Impaired Following commands impaired: Follows one step commands inconsistently     Cueing  General Comments   Cueing Techniques: Verbal cues;Gestural cues;Tactile cues  BP once returned to supine: 130/70 mm Hg           Home Living Family/patient expects to be discharged to:: Private residence Living Arrangements: Alone Available Help at Discharge: Friend(s);Available PRN/intermittently Type of Home: Apartment Home Access: Level entry     Home Layout: One level               Home Equipment: Cane - single point          Prior Functioning/Environment Prior Level of Function : Independent/Modified Independent;Patient poor historian/Family not available             Mobility Comments: has SPC she uses at baseline ADLs Comments: relies on public transportation, reports neighbor/friend comes to check on her but she reports being independent with ADL and mobility. No one present to verify.    OT Problem List: Decreased strength;Decreased coordination;Cardiopulmonary status limiting activity;Decreased cognition;Decreased safety awareness;Decreased activity tolerance;Impaired balance (sitting and/or standing);Decreased knowledge of use of DME or AE;Impaired UE functional use   OT Treatment/Interventions: Self-care/ADL training;Therapeutic exercise;Therapeutic activities;Energy conservation;DME and/or AE instruction;Patient/family education;Balance training;Neuromuscular education      OT Goals(Current goals can be found in the care plan section)   Acute Rehab OT Goals Patient Stated Goal: feel better OT Goal Formulation: With patient Time For Goal Achievement: 07/07/24 Potential  to Achieve Goals: Good ADL Goals Pt Will Perform Lower Body Dressing: with modified independence;with adaptive equipment;sit to/from stand;sitting/lateral leans Pt Will Transfer to Toilet: with supervision;ambulating;regular height toilet;bedside commode (LRAD) Pt Will Perform Toileting - Clothing Manipulation and hygiene: with modified independence;sitting/lateral leans;sit to/from stand Additional ADL Goal #1: Pt will complete all aspects of bathing, primarily from seated position, with supv for safety, 1/1 opportunity.   OT Frequency:  Min 2X/week    Co-evaluation PT/OT/SLP Co-Evaluation/Treatment: Yes Reason for Co-Treatment: Complexity of the patient's impairments (multi-system involvement);Necessary to address cognition/behavior during functional activity;For patient/therapist safety PT goals addressed during session: Mobility/safety with mobility;Balance OT goals addressed during session: ADL's and self-care      AM-PAC OT 6 Clicks Daily Activity     Outcome Measure Help from another person eating meals?: A Little Help from another person taking care of personal grooming?: A Little Help from another person toileting, which includes using toliet, bedpan, or urinal?: A Lot Help from another person bathing (including washing, rinsing, drying)?: A Lot Help from another person to put on and taking off regular upper body clothing?: A Little Help from another person to put on and taking off regular lower body clothing?: A Lot 6 Click Score: 15   End of Session Nurse Communication: Mobility status;Other (comment) (cognition)  Activity Tolerance: Patient limited by lethargy Patient left: in bed;with call bell/phone within reach;with bed alarm set  OT Visit Diagnosis: Other abnormalities of gait and mobility (R26.89);History of falling (Z91.81);Muscle weakness (generalized) (M62.81);Other symptoms and signs involving cognitive function                Time: 8591-8570 OT Time  Calculation (min): 21 min Charges:  OT General Charges $OT Visit: 1 Visit OT Evaluation $OT Eval Low Complexity: 1 Low  Warren SAUNDERS., MPH, MS, OTR/L ascom  [phone] 06/23/24, 3:46 PM

## 2024-06-23 NOTE — Plan of Care (Signed)
  Problem: Education: Goal: Knowledge of General Education information will improve Description: Including pain rating scale, medication(s)/side effects and non-pharmacologic comfort measures Outcome: Progressing   Problem: Health Behavior/Discharge Planning: Goal: Ability to manage health-related needs will improve Outcome: Progressing   Problem: Clinical Measurements: Goal: Ability to maintain clinical measurements within normal limits will improve Outcome: Progressing   Problem: Activity: Goal: Risk for activity intolerance will decrease Outcome: Progressing   Problem: Nutrition: Goal: Adequate nutrition will be maintained Outcome: Progressing   Problem: Coping: Goal: Level of anxiety will decrease Outcome: Progressing   Problem: Elimination: Goal: Will not experience complications related to bowel motility Outcome: Progressing   Problem: Safety: Goal: Ability to remain free from injury will improve Outcome: Progressing   Problem: Skin Integrity: Goal: Risk for impaired skin integrity will decrease Outcome: Progressing   Problem: Education: Goal: Expressions of having a comfortable level of knowledge regarding the disease process will increase Outcome: Progressing   Problem: Health Behavior/Discharge Planning: Goal: Compliance with prescribed medication regimen will improve Outcome: Progressing   Problem: Medication: Goal: Risk for medication side effects will decrease Outcome: Progressing   Problem: Clinical Measurements: Goal: Complications related to the disease process, condition or treatment will be avoided or minimized Outcome: Progressing   Problem: Safety: Goal: Verbalization of understanding the information provided will improve Outcome: Progressing

## 2024-06-23 NOTE — Progress Notes (Signed)
 PROGRESS NOTE    Kelly [name]  FMW:[id number] DOB: 1966-05-16 DOA: 06/21/2024 PCP: Wellington Curtis LABOR, FNP   Assessment & Plan:   Principal Problem:   Seizure (HCC) Active Problems:   Seizures (HCC)   AKI (acute kidney injury) (HCC)  Assessment and Plan: Seizure: likely secondary to noncoherent with seizure medications. Continue on home doses of vimpat , depakote , lamictal  & gabapentin . CT head shows no acute intracranial abnormalities    Leukocytosis: likely reactive. Will continue to monitor    Depression: severity unknown. Continue on home dose of risperdal , seroquel    HTN: continue on home dose of losartan , amlodipine , clonidine    Obesity: BMI 34.7. Would benefit from weight loss       DVT prophylaxis: lovenox  Code Status: full  Family Communication:  Disposition Plan: depends on PT/OT recs .  Level of care: Telemetry Medical  Status is: Inpatient Remains inpatient appropriate because: severity of illness    Consultants:    Procedures:   Antimicrobials:    Subjective: Pt c/o intermittent difficulty talking.  Objective: Vitals:   06/22/24 1715 06/22/24 1950 06/22/24 2338 06/23/24 0525  BP: (!) 110/58 119/86 (!) 142/79 (!) 103/59  Pulse: 86 97 87 88  Resp: 16 17 17 16   Temp: 98 F (36.7 C) 98 F (36.7 C) 98.2 F (36.8 C) 98.7 F (37.1 C)  TempSrc: Oral  Oral Oral  SpO2: 99% 94% 97% 91%  Weight:      Height:        Intake/Output Summary (Last 24 hours) at 06/23/2024 9178 Last data filed at 06/22/2024 1900 Gross per 24 hour  Intake 331.2 ml  Output --  Net 331.2 ml   Filed Weights   06/21/24 2351  Weight: 97.5 kg    Examination:  General exam: Appears calm and comfortable  Respiratory system: Clear to auscultation. Respiratory effort normal. Cardiovascular system: S1 & S2+. No rubs, gallops or clicks.  Gastrointestinal system: Abdomen is obese, soft and nontender. Normal bowel sounds heard. Central nervous system: Alert and awake.  Moves all extremities  Psychiatry: Judgement and insight appears not at baseline.     Data Reviewed: I have personally reviewed following labs and imaging studies  CBC: Recent Labs  Lab 06/21/24 2346 06/23/24 0517  WBC 14.6* 11.8*  NEUTROABS 11.0*  --   HGB 12.6 10.8*  HCT 38.5 33.8*  MCV 83.7 84.7  PLT 255 221   Basic Metabolic Panel: Recent Labs  Lab 06/21/24 2346  NA 140  K 4.0  CL 101  CO2 25  GLUCOSE 95  BUN 20  CREATININE 0.89  CALCIUM  9.6  MG 2.2   GFR: Estimated Creatinine Clearance: 84.1 mL/min (by C-G formula based on SCr of 0.89 mg/dL). Liver Function Tests: Recent Labs  Lab 06/21/24 2346  AST 33  ALT 19  ALKPHOS 86  BILITOT 1.1  PROT 8.4*  ALBUMIN 4.1   No results for input(s): LIPASE, AMYLASE in the last 168 hours. No results for input(s): AMMONIA in the last 168 hours. Coagulation Profile: No results for input(s): INR, PROTIME in the last 168 hours. Cardiac Enzymes: Recent Labs  Lab 06/21/24 2346  CKTOTAL 402*   BNP (last 3 results) No results for input(s): PROBNP in the last 8760 hours. HbA1C: No results for input(s): HGBA1C in the last 72 hours. CBG: No results for input(s): GLUCAP in the last 168 hours. Lipid Profile: No results for input(s): CHOL, HDL, LDLCALC, TRIG, CHOLHDL, LDLDIRECT in the last 72 hours. Thyroid  Function Tests: No results for  input(s): TSH, T4TOTAL, FREET4, T3FREE, THYROIDAB in the last 72 hours. Anemia Panel: No results for input(s): VITAMINB12, FOLATE, FERRITIN, TIBC, IRON, RETICCTPCT in the last 72 hours. Sepsis Labs: No results for input(s): PROCALCITON, LATICACIDVEN in the last 168 hours.  No results found for this or any previous visit (from the past 240 hours).       Radiology Studies: EEG adult Result Date: 06/22/2024 Shelton Arlin KIDD, MD     06/22/2024 12:28 PM Patient Name: Kelly [name] MRN: [id number] Epilepsy Attending: Arlin KIDD Shelton  Referring Physician/Provider: Laurita Cort DASEN, MD Date: 06/22/2024 Duration: 28.34 mins Patient history:  58 y.o. female with medical history significant of seizure, HTN, CAD MI, anxiety/depression, presented with seizure. EEG to evaluate for seizure Level of alertness: Awake AEDs during EEG study: GBP, VPA Technical aspects: This EEG study was done with scalp electrodes positioned according to the 10-20 International system of electrode placement. Electrical activity was reviewed with band pass filter of 1-70Hz , sensitivity of 7 uV/mm, display speed of 60mm/sec with a 60Hz  notched filter applied as appropriate. EEG data were recorded continuously and digitally stored.  Video monitoring was available and reviewed as appropriate. Description: The posterior dominant rhythm consists of 8 Hz activity of moderate voltage (25-35 uV) seen predominantly in posterior head regions, symmetric and reactive to eye opening and eye closing. Hyperventilation and photic stimulation were not performed.   IMPRESSION: This study is within normal limits. No seizures or epileptiform discharges were seen throughout the recording. A normal interictal EEG does not exclude the diagnosis of epilepsy. Arlin KIDD Shelton   DG Foot 2 Views Left Result Date: 06/22/2024 CLINICAL DATA:  Fall EXAM: LEFT FOOT - 2 VIEW COMPARISON:  None Available. FINDINGS: No malalignment. Questionable lucency at the base of the third metatarsal. Degenerative changes at the first MTP joint. IMPRESSION: Questionable minimal lucency and deformity at the base of the third metatarsal, correlate for point tenderness Electronically Signed   By: Luke Bun M.D.   On: 06/22/2024 01:47   DG Forearm Left Result Date: 06/22/2024 CLINICAL DATA:  Fall EXAM: LEFT FOREARM - 2 VIEW COMPARISON:  05/05/2012 FINDINGS: There is no evidence of fracture or other focal bone lesions. Soft tissues are unremarkable. IMPRESSION: Negative. Electronically Signed   By: Luke Bun M.D.   On:  06/22/2024 01:45   DG Chest Portable 1 View Result Date: 06/22/2024 CLINICAL DATA:  Fall, dyspnea EXAM: PORTABLE CHEST 1 VIEW COMPARISON:  03/09/2023 FINDINGS: Mild hypoventilatory changes. Atelectasis or scarring in the left lower lung. Cardiomediastinal silhouette within normal limits. Aortic atherosclerosis IMPRESSION: No active disease. Atelectasis or scarring in the left lower lung. Electronically Signed   By: Luke Bun M.D.   On: 06/22/2024 01:44   CT Head Wo Contrast Result Date: 06/22/2024 CLINICAL DATA:  Mental status change, unknown cause Neuro deficit, acute, stroke suspected seizure, L sided weakness, ams, found down, ? trauma; Neck trauma, intoxicated or obtunded (Age >= 16y) EXAM: CT HEAD WITHOUT CONTRAST CT CERVICAL SPINE WITHOUT CONTRAST TECHNIQUE: Multidetector CT imaging of the head and cervical spine was performed following the standard protocol without intravenous contrast. Multiplanar CT image reconstructions of the cervical spine were also generated. RADIATION DOSE REDUCTION: This exam was performed according to the departmental dose-optimization program which includes automated exposure control, adjustment of the mA and/or kV according to patient size and/or use of iterative reconstruction technique. COMPARISON:  CT head and CT cervical spine 11/26/2021. FINDINGS: Mildly motion limited study.  Within this limitation: CT HEAD FINDINGS  Brain: No evidence of acute infarction, hemorrhage, hydrocephalus, extra-axial collection or mass lesion/mass effect. Vascular: No hyperdense vessel. Skull: No acute fracture. Sinuses/Orbits: No acute finding. CT CERVICAL SPINE FINDINGS Alignment: Unchanged alignment. No substantial sagittal subluxation. Skull base and vertebrae: No evidence of acute fracture. Vertebral body heights are maintained. Soft tissues and spinal canal: No prevertebral fluid or swelling. No visible canal hematoma. Disc levels: Similar multilevel degenerative change. Upper chest:  Lung apices are clear. IMPRESSION: 1. No evidence of acute intracranial abnormality. 2. No evidence of acute fracture or traumatic malalignment in the cervical spine. Electronically Signed   By: Gilmore GORMAN Molt M.D.   On: 06/22/2024 00:25   CT Cervical Spine Wo Contrast Result Date: 06/22/2024 CLINICAL DATA:  Mental status change, unknown cause Neuro deficit, acute, stroke suspected seizure, L sided weakness, ams, found down, ? trauma; Neck trauma, intoxicated or obtunded (Age >= 16y) EXAM: CT HEAD WITHOUT CONTRAST CT CERVICAL SPINE WITHOUT CONTRAST TECHNIQUE: Multidetector CT imaging of the head and cervical spine was performed following the standard protocol without intravenous contrast. Multiplanar CT image reconstructions of the cervical spine were also generated. RADIATION DOSE REDUCTION: This exam was performed according to the departmental dose-optimization program which includes automated exposure control, adjustment of the mA and/or kV according to patient size and/or use of iterative reconstruction technique. COMPARISON:  CT head and CT cervical spine 11/26/2021. FINDINGS: Mildly motion limited study.  Within this limitation: CT HEAD FINDINGS Brain: No evidence of acute infarction, hemorrhage, hydrocephalus, extra-axial collection or mass lesion/mass effect. Vascular: No hyperdense vessel. Skull: No acute fracture. Sinuses/Orbits: No acute finding. CT CERVICAL SPINE FINDINGS Alignment: Unchanged alignment. No substantial sagittal subluxation. Skull base and vertebrae: No evidence of acute fracture. Vertebral body heights are maintained. Soft tissues and spinal canal: No prevertebral fluid or swelling. No visible canal hematoma. Disc levels: Similar multilevel degenerative change. Upper chest: Lung apices are clear. IMPRESSION: 1. No evidence of acute intracranial abnormality. 2. No evidence of acute fracture or traumatic malalignment in the cervical spine. Electronically Signed   By: Gilmore GORMAN Molt  M.D.   On: 06/22/2024 00:25        Scheduled Meds:  amLODipine   5 mg Oral Daily   atorvastatin   40 mg Oral QHS   cloNIDine   0.1 mg Oral Daily   divalproex   500 mg Oral BID   enoxaparin  (LOVENOX ) injection  0.5 mg/kg Subcutaneous QHS   gabapentin   300 mg Oral q AM   gabapentin   900 mg Oral QHS   lacosamide   200 mg Oral BID   lamoTRIgine   200 mg Oral BID   losartan   25 mg Oral Daily   montelukast   10 mg Oral QHS   nortriptyline   50 mg Oral QHS   mouth rinse  15 mL Mouth Rinse Q2H   pantoprazole   40 mg Oral Daily   QUEtiapine   25 mg Oral BID   risperiDONE   2 mg Oral QHS   Continuous Infusions:  sodium chloride  Stopped (06/22/24 1403)     LOS: 1 day        Anthony CHRISTELLA Pouch, MD [facility] Pager [phone]  If 7PM-7AM, please contact night-coverage [email or web address] 06/23/2024, 8:21 AM

## 2024-06-23 NOTE — Plan of Care (Signed)
  Problem: Health Behavior/Discharge Planning: Goal: Ability to manage health-related needs will improve Outcome: Progressing   Problem: Clinical Measurements: Goal: Will remain free from infection Outcome: Progressing   Problem: Activity: Goal: Risk for activity intolerance will decrease Outcome: Progressing   Problem: Coping: Goal: Level of anxiety will decrease Outcome: Progressing   

## 2024-06-23 NOTE — Evaluation (Signed)
 Physical Therapy Evaluation Patient Details Name: Kelly [name] MRN: [id number] DOB: [birth year] Today's Date: 06/23/2024  History of Present Illness  Kelly [name] is a 58 y.o. female with medical history significant of seizure, HTN, CAD MI, anxiety/depression, presented with seizure.  Clinical Impression  Pt admitted with above diagnosis. Pt currently with functional limitations due to the deficits listed below (see PT Problem List). Pt received upright in bed agreeable to PT/OT co-eval due to pt history, chronic L sided weakness. Pt with difficulty expressing speech, appears drowsy/lethargic. Reports living alone, has neighbors that checks on her. Mod-I with SPC with gait and indep with her ADL's. Relies on public transportation.   To date, pt very impulsive sitting EOB. Overall not aware of her surrounds with PT/OT trying to set up room, telemetry unit for mobility, hard to redirect. Upon sitting pt attempts to don socks in sitting, has intermittent posterior lean needing PRN modA at torso to correct. Pt becomes less responsive sitting, reduced sitting balance needing hands on assist to prevent slumping over, begins closing eyes. Pt reports feeling woozy. Pt returned to supine. HR in 90 BPM, BP in RUE assessed at 130/70 mm Hg. Unsure of possible orthostatic episode. Seems to become more alert upon return to supine. MD updated along with RN. Anticipate pt will need 2 person assist for further mobility next session due to baseline L sided weakness, impulsivity, and lack of safety awareness and current cognition. Pt will benefit from skilled PT services < 3 hours/day to address these deficits.      If plan is discharge home, recommend the following: Two people to help with walking and/or transfers;A lot of help with bathing/dressing/bathroom;Assistance with cooking/housework;Help with stairs or ramp for entrance   Can travel by private vehicle   No    Equipment Recommendations Other (comment)  (TBD by next venue of care)  Recommendations for Other Services       Functional Status Assessment Patient has had a recent decline in their functional status and demonstrates the ability to make significant improvements in function in a reasonable and predictable amount of time.     Precautions / Restrictions Precautions Precautions: Fall Precaution/Restrictions Comments: hx of seizure disorder Restrictions Weight Bearing Restrictions Per Provider Order: No      Mobility  Bed Mobility Overal bed mobility: Modified Independent               Patient Response: Cooperative, Impulsive  Transfers                   General transfer comment: unable due to cognition, less alert with sitting thus returned to supine    Ambulation/Gait               General Gait Details: deferred  Stairs            Wheelchair Mobility     Tilt Bed Tilt Bed Patient Response: Cooperative, Impulsive  Modified Rankin (Stroke Patients Only)       Balance Overall balance assessment: Needs assistance Sitting-balance support: Bilateral upper extremity supported, Feet supported Sitting balance-Leahy Scale: Poor Sitting balance - Comments: posterior leaning Postural control: Posterior lean                                   Pertinent Vitals/Pain Pain Assessment Pain Assessment: No/denies pain    Home Living Family/patient expects to be discharged to:: Private residence Living Arrangements: Alone Available Help  at Discharge: Friend(s);Available PRN/intermittently Type of Home: Apartment Home Access: Level entry       Home Layout: One level Home Equipment: Cane - single point      Prior Function Prior Level of Function : Independent/Modified Independent             Mobility Comments: has SPC she uses at baseline ADLs Comments: relies on public transportation     Extremity/Trunk Assessment   Upper Extremity Assessment Upper Extremity  Assessment: Defer to OT evaluation    Lower Extremity Assessment Lower Extremity Assessment: Generalized weakness       Communication   Communication Communication: No apparent difficulties Factors Affecting Communication: Difficulty expressing self;Reduced clarity of speech    Cognition Arousal: Lethargic Behavior During Therapy: Impulsive   PT - Cognitive impairments: Difficult to assess Difficult to assess due to: Level of arousal, Impaired communication                     PT - Cognition Comments: appears very groggy. Impulsive, moves quickly and unsteady. Does not appear aware of safety awareness. Difficulty with articulating speech. Following commands: Impaired Following commands impaired: Follows one step commands inconsistently     Cueing Cueing Techniques: Verbal cues, Gestural cues, Tactile cues     General Comments General comments (skin integrity, edema, etc.): BP once returned to supine: 130/70 mm Hg    Exercises     Assessment/Plan    PT Assessment Patient needs continued PT services  PT Problem List Decreased strength;Decreased safety awareness;Decreased cognition;Decreased activity tolerance;Decreased balance       PT Treatment Interventions DME instruction;Therapeutic exercise;Gait training;Balance training;Stair training;Neuromuscular re-education;Therapeutic activities;Patient/family education;Functional mobility training;Cognitive remediation    PT Goals (Current goals can be found in the Care Plan section)  Acute Rehab PT Goals PT Goal Formulation: Patient unable to participate in goal setting Time For Goal Achievement: 07/07/24    Frequency Min 3X/week     Co-evaluation PT/OT/SLP Co-Evaluation/Treatment: Yes Reason for Co-Treatment: Complexity of the patient's impairments (multi-system involvement);Necessary to address cognition/behavior during functional activity;For patient/therapist safety PT goals addressed during session:  Mobility/safety with mobility;Balance OT goals addressed during session: ADL's and self-care       AM-PAC PT 6 Clicks Mobility  Outcome Measure Help needed turning from your back to your side while in a flat bed without using bedrails?: A Little Help needed moving from lying on your back to sitting on the side of a flat bed without using bedrails?: A Little Help needed moving to and from a bed to a chair (including a wheelchair)?: Total Help needed standing up from a chair using your arms (e.g., wheelchair or bedside chair)?: Total Help needed to walk in hospital room?: Total Help needed climbing 3-5 steps with a railing? : Total 6 Click Score: 10    End of Session   Activity Tolerance: Treatment limited secondary to medical complications (Comment) (cognition, possible orthostatic episode, or medication induced?) Patient left: in bed;with call bell/phone within reach;with bed alarm set;with nursing/sitter in room Nurse Communication: Mobility status PT Visit Diagnosis: Muscle weakness (generalized) (M62.81);Other abnormalities of gait and mobility (R26.89)    Time: 8591-8570 PT Time Calculation (min) (ACUTE ONLY): 21 min   Charges:   PT Evaluation $PT Eval Moderate Complexity: 1 Mod   PT General Charges $$ ACUTE PT VISIT: 1 Visit         Sanders Manninen M. Fairly IV, PT, DPT Physical Therapist- [place]  [facility] 06/23/2024, 3:20 PM

## 2024-06-24 DIAGNOSIS — R569 Unspecified convulsions: Secondary | ICD-10-CM | POA: Diagnosis not present

## 2024-06-24 LAB — BASIC METABOLIC PANEL WITH GFR
Anion gap: 8 (ref 5–15)
BUN: 29 mg/dL — ABNORMAL HIGH (ref 6–20)
CO2: 25 mmol/L (ref 22–32)
Calcium: 8.6 mg/dL — ABNORMAL LOW (ref 8.9–10.3)
Chloride: 106 mmol/L (ref 98–111)
Creatinine, Ser: 1.42 mg/dL — ABNORMAL HIGH (ref 0.44–1.00)
GFR, Estimated: 44 mL/min — ABNORMAL LOW (ref >60)
Glucose, Bld: 106 mg/dL — ABNORMAL HIGH (ref 70–99)
Potassium: 3.9 mmol/L (ref 3.5–5.1)
Sodium: 139 mmol/L (ref 135–145)

## 2024-06-24 LAB — CBC
HCT: 32.8 % — ABNORMAL LOW (ref 36.0–46.0)
Hemoglobin: 10.5 g/dL — ABNORMAL LOW (ref 12.0–15.0)
MCH: 27.1 pg (ref 26.0–34.0)
MCHC: 32 g/dL (ref 30.0–36.0)
MCV: 84.8 fL (ref 80.0–100.0)
Platelets: 237 K/uL (ref 150–400)
RBC: 3.87 MIL/uL (ref 3.87–5.11)
RDW: 16.9 % — ABNORMAL HIGH (ref 11.5–15.5)
WBC: 10.2 K/uL (ref 4.0–10.5)
nRBC: 0 % (ref 0.0–0.2)

## 2024-06-24 NOTE — Plan of Care (Signed)
  Problem: Health Behavior/Discharge Planning: Goal: Ability to manage health-related needs will improve Outcome: Progressing   Problem: Clinical Measurements: Goal: Will remain free from infection Outcome: Progressing   Problem: Activity: Goal: Risk for activity intolerance will decrease Outcome: Progressing   Problem: Pain Managment: Goal: General experience of comfort will improve and/or be controlled Outcome: Progressing

## 2024-06-24 NOTE — Plan of Care (Signed)
  Problem: Education: Goal: Knowledge of General Education information will improve Description: Including pain rating scale, medication(s)/side effects and non-pharmacologic comfort measures Outcome: Progressing   Problem: Health Behavior/Discharge Planning: Goal: Ability to manage health-related needs will improve Outcome: Progressing   Problem: Clinical Measurements: Goal: Ability to maintain clinical measurements within normal limits will improve Outcome: Progressing   Problem: Activity: Goal: Risk for activity intolerance will decrease Outcome: Progressing   Problem: Nutrition: Goal: Adequate nutrition will be maintained Outcome: Progressing   Problem: Coping: Goal: Level of anxiety will decrease Outcome: Progressing   Problem: Elimination: Goal: Will not experience complications related to bowel motility Outcome: Progressing   Problem: Pain Managment: Goal: General experience of comfort will improve and/or be controlled Outcome: Progressing   Problem: Safety: Goal: Ability to remain free from injury will improve Outcome: Progressing   Problem: Skin Integrity: Goal: Risk for impaired skin integrity will decrease Outcome: Progressing   Problem: Education: Goal: Expressions of having a comfortable level of knowledge regarding the disease process will increase Outcome: Progressing   Problem: Coping: Goal: Ability to adjust to condition or change in health will improve Outcome: Progressing   Problem: Health Behavior/Discharge Planning: Goal: Compliance with prescribed medication regimen will improve Outcome: Progressing   Problem: Medication: Goal: Risk for medication side effects will decrease Outcome: Progressing   Problem: Clinical Measurements: Goal: Complications related to the disease process, condition or treatment will be avoided or minimized Outcome: Progressing   Problem: Safety: Goal: Verbalization of understanding the information provided  will improve Outcome: Progressing

## 2024-06-24 NOTE — Progress Notes (Signed)
 PROGRESS NOTE    Kelly [name]  FMW:[id number] DOB: 20-May-1966 DOA: 06/21/2024 PCP: Wellington Curtis LABOR, FNP   Assessment & Plan:   Principal Problem:   Seizure (HCC) Active Problems:   Seizures (HCC)   AKI (acute kidney injury) (HCC)  Assessment and Plan: Seizure: etiology unclear, likely secondary to noncoherent with seizure medications. Continue on home dose of lamictal , depakote , vimpat , & gabapentin . CT head shows no acute intracranial abnormalities   Generalized weakness: PT/OT recs SNF. Pt is agreeable to SNF.    Leukocytosis: resolved   Depression: severity unknown. Continue on home dose of seroquel , risperdal    HTN: continue on home dose of amlodipine , losartan , clonidine     Obesity: BMI 34.7. Would benefit from weight loss       DVT prophylaxis: lovenox  Code Status: full  Family Communication:  Disposition Plan: likely d/c to SNF  Level of care: Telemetry Medical  Status is: Inpatient Remains inpatient appropriate because: needs SNF placement     Consultants:    Procedures:   Antimicrobials:    Subjective: Pt c/o generalized weakness  Objective: Vitals:   06/23/24 1916 06/24/24 0109 06/24/24 0430 06/24/24 0750  BP: (!) 106/54 128/63 (!) 99/49 128/73  Pulse: 87 89 83 90  Resp: 20 18 17 20   Temp: 97.8 F (36.6 C) 98.2 F (36.8 C) 97.6 F (36.4 C) 98 F (36.7 C)  TempSrc: Oral Oral Tympanic Oral  SpO2: 98% 97% 98% 98%  Weight:      Height:        Intake/Output Summary (Last 24 hours) at 06/24/2024 0836 Last data filed at 06/23/2024 0900 Gross per 24 hour  Intake 120 ml  Output --  Net 120 ml   Filed Weights   06/21/24 2351  Weight: 97.5 kg    Examination:  General exam: Appears comfortable  Respiratory system: decreased breath sounds b/l Cardiovascular system: S1/S2+. No rubs or clicks  Gastrointestinal system: abd is soft, NT, obese & hypoactive bowel sounds. Central nervous system: alert & awake. Moves all extremities   Psychiatry: Judgement and insight appears at baseline. Flat mood and affect    Data Reviewed: I have personally reviewed following labs and imaging studies  CBC: Recent Labs  Lab 06/21/24 2346 06/23/24 0517 06/24/24 0324  WBC 14.6* 11.8* 10.2  NEUTROABS 11.0*  --   --   HGB 12.6 10.8* 10.5*  HCT 38.5 33.8* 32.8*  MCV 83.7 84.7 84.8  PLT 255 221 237   Basic Metabolic Panel: Recent Labs  Lab 06/21/24 2346 06/24/24 0324  NA 140 139  K 4.0 3.9  CL 101 106  CO2 25 25  GLUCOSE 95 106*  BUN 20 29*  CREATININE 0.89 1.42*  CALCIUM  9.6 8.6*  MG 2.2  --    GFR: Estimated Creatinine Clearance: 52.7 mL/min (A) (by C-G formula based on SCr of 1.42 mg/dL (H)). Liver Function Tests: Recent Labs  Lab 06/21/24 2346  AST 33  ALT 19  ALKPHOS 86  BILITOT 1.1  PROT 8.4*  ALBUMIN 4.1   No results for input(s): LIPASE, AMYLASE in the last 168 hours. No results for input(s): AMMONIA in the last 168 hours. Coagulation Profile: No results for input(s): INR, PROTIME in the last 168 hours. Cardiac Enzymes: Recent Labs  Lab 06/21/24 2346  CKTOTAL 402*   BNP (last 3 results) No results for input(s): PROBNP in the last 8760 hours. HbA1C: No results for input(s): HGBA1C in the last 72 hours. CBG: No results for input(s): GLUCAP in the  last 168 hours. Lipid Profile: No results for input(s): CHOL, HDL, LDLCALC, TRIG, CHOLHDL, LDLDIRECT in the last 72 hours. Thyroid  Function Tests: No results for input(s): TSH, T4TOTAL, FREET4, T3FREE, THYROIDAB in the last 72 hours. Anemia Panel: No results for input(s): VITAMINB12, FOLATE, FERRITIN, TIBC, IRON, RETICCTPCT in the last 72 hours. Sepsis Labs: No results for input(s): PROCALCITON, LATICACIDVEN in the last 168 hours.  No results found for this or any previous visit (from the past 240 hours).       Radiology Studies: EEG adult Result Date: 06/22/2024 Shelton Arlin KIDD, MD      06/22/2024 12:28 PM Patient Name: Liberti Appleton MRN: [id number] Epilepsy Attending: Arlin KIDD Shelton Referring Physician/Provider: Laurita Cort DASEN, MD Date: 06/22/2024 Duration: 28.34 mins Patient history:  58 y.o. female with medical history significant of seizure, HTN, CAD MI, anxiety/depression, presented with seizure. EEG to evaluate for seizure Level of alertness: Awake AEDs during EEG study: GBP, VPA Technical aspects: This EEG study was done with scalp electrodes positioned according to the 10-20 International system of electrode placement. Electrical activity was reviewed with band pass filter of 1-70Hz , sensitivity of 7 uV/mm, display speed of 26mm/sec with a 60Hz  notched filter applied as appropriate. EEG data were recorded continuously and digitally stored.  Video monitoring was available and reviewed as appropriate. Description: The posterior dominant rhythm consists of 8 Hz activity of moderate voltage (25-35 uV) seen predominantly in posterior head regions, symmetric and reactive to eye opening and eye closing. Hyperventilation and photic stimulation were not performed.   IMPRESSION: This study is within normal limits. No seizures or epileptiform discharges were seen throughout the recording. A normal interictal EEG does not exclude the diagnosis of epilepsy. Priyanka O Yadav        Scheduled Meds:  amLODipine   5 mg Oral Daily   atorvastatin   40 mg Oral QHS   cloNIDine   0.1 mg Oral Daily   divalproex   500 mg Oral BID   enoxaparin  (LOVENOX ) injection  0.5 mg/kg Subcutaneous QHS   gabapentin   300 mg Oral q AM   gabapentin   900 mg Oral QHS   lacosamide   200 mg Oral BID   lamoTRIgine   200 mg Oral BID   montelukast   10 mg Oral QHS   nortriptyline   50 mg Oral QHS   mouth rinse  15 mL Mouth Rinse Q2H   pantoprazole   40 mg Oral Daily   QUEtiapine   25 mg Oral BID   risperiDONE   2 mg Oral QHS   Continuous Infusions:     LOS: 2 days        Anthony CHRISTELLA Pouch, MD [facility] Pager [phone]  If 7PM-7AM, please contact night-coverage [email or web address] 06/24/2024, 8:36 AM

## 2024-06-25 DIAGNOSIS — R569 Unspecified convulsions: Secondary | ICD-10-CM | POA: Diagnosis not present

## 2024-06-25 LAB — BASIC METABOLIC PANEL WITH GFR
Anion gap: 10 (ref 5–15)
BUN: 27 mg/dL — ABNORMAL HIGH (ref 6–20)
CO2: 28 mmol/L (ref 22–32)
Calcium: 8.8 mg/dL — ABNORMAL LOW (ref 8.9–10.3)
Chloride: 104 mmol/L (ref 98–111)
Creatinine, Ser: 0.98 mg/dL (ref 0.44–1.00)
GFR, Estimated: >60 mL/min (ref >60)
Glucose, Bld: 90 mg/dL (ref 70–99)
Potassium: 4.7 mmol/L (ref 3.5–5.1)
Sodium: 142 mmol/L (ref 135–145)

## 2024-06-25 LAB — CBC
HCT: 33.3 % — ABNORMAL LOW (ref 36.0–46.0)
Hemoglobin: 10.7 g/dL — ABNORMAL LOW (ref 12.0–15.0)
MCH: 27.2 pg (ref 26.0–34.0)
MCHC: 32.1 g/dL (ref 30.0–36.0)
MCV: 84.5 fL (ref 80.0–100.0)
Platelets: 246 K/uL (ref 150–400)
RBC: 3.94 MIL/uL (ref 3.87–5.11)
RDW: 17.1 % — ABNORMAL HIGH (ref 11.5–15.5)
WBC: 10 K/uL (ref 4.0–10.5)
nRBC: 0 % (ref 0.0–0.2)

## 2024-06-25 MED ORDER — METOPROLOL TARTRATE 5 MG/5ML IV SOLN: 5.0000 mg | Freq: Four times a day (QID) | INTRAVENOUS | Status: DC | PRN

## 2024-06-25 MED ORDER — POLYETHYLENE GLYCOL 3350 17 G PO PACK
17.0000 g | PACK | Freq: Every day | ORAL | Status: DC
  Administered 2024-06-25 – 2024-06-28 (×7): 17 g via ORAL
  Filled 2024-06-25 (×4): qty 1

## 2024-06-25 MED ORDER — DOCUSATE SODIUM 100 MG PO CAPS
200.0000 mg | ORAL_CAPSULE | Freq: Two times a day (BID) | ORAL | Status: DC
  Administered 2024-06-25 – 2024-06-28 (×12): 200 mg via ORAL
  Filled 2024-06-25 (×7): qty 2

## 2024-06-25 NOTE — Plan of Care (Signed)
  Problem: Health Behavior/Discharge Planning: Goal: Ability to manage health-related needs will improve Outcome: Progressing   Problem: Nutrition: Goal: Adequate nutrition will be maintained Outcome: Progressing   Problem: Safety: Goal: Ability to remain free from injury will improve Outcome: Progressing   Problem: Health Behavior/Discharge Planning: Goal: Compliance with prescribed medication regimen will improve Outcome: Progressing

## 2024-06-25 NOTE — Progress Notes (Signed)
 PROGRESS NOTE    Kelly [name]  FMW:[id number] DOB: May 16, 1966 DOA: 06/21/2024 PCP: Wellington Curtis LABOR, FNP   Assessment & Plan:   Principal Problem:   Seizure (HCC) Active Problems:   Seizures (HCC)   AKI (acute kidney injury) (HCC)  Assessment and Plan: Seizure: etiology unclear, likely secondary to noncoherent with seizure medications. Continue on home dose of vimpat , lamictal , depakote , & gabapentin . CT head shows no acute intracranial abnormalities   Generalized weakness: PT/OT recs SNF. Pt is agreeable to SNF    Leukocytosis: resolved   Depression: severity unknown. Continue on home dose of risperdal , seroquel   HTN: continue on home dose of losartan , clonidine , amlodipine     Obesity: BMI 34.7. Would benefit from weight loss       DVT prophylaxis: lovenox  Code Status: full  Family Communication:  Disposition Plan: likely d/c to SNF  Level of care: Telemetry Medical  Status is: Inpatient Remains inpatient appropriate because: still needs SNF placement     Consultants:    Procedures:   Antimicrobials:    Subjective: Pt c/o fatigue.  Objective: Vitals:   06/24/24 2034 06/25/24 0013 06/25/24 0414 06/25/24 0817  BP: (!) 128/51 (!) 130/54 107/79 106/86  Pulse: 87 96 79 91  Resp: 18 18 18 16   Temp: (!) 97.5 F (36.4 C) 98.2 F (36.8 C) 98.4 F (36.9 C) 98.3 F (36.8 C)  TempSrc:  Oral Oral   SpO2: 99% 98% 96% 98%  Weight:      Height:        Intake/Output Summary (Last 24 hours) at 06/25/2024 0829 Last data filed at 06/24/2024 1300 Gross per 24 hour  Intake 480 ml  Output --  Net 480 ml   Filed Weights   06/21/24 2351  Weight: 97.5 kg    Examination:  General exam: appears calm & comfortable  Respiratory system: diminished breath sounds b/l  Cardiovascular system:S1 & S2+. No rubs or clicks   Gastrointestinal system: abd is soft, NT, obese & hypoactive bowel sounds Central nervous system: alert & awake. Moves all extremities    Psychiatry: Judgement and insight appears at baseline. Flat mood and affect    Data Reviewed: I have personally reviewed following labs and imaging studies  CBC: Recent Labs  Lab 06/21/24 2346 06/23/24 0517 06/24/24 0324 06/25/24 0541  WBC 14.6* 11.8* 10.2 10.0  NEUTROABS 11.0*  --   --   --   HGB 12.6 10.8* 10.5* 10.7*  HCT 38.5 33.8* 32.8* 33.3*  MCV 83.7 84.7 84.8 84.5  PLT 255 221 237 246   Basic Metabolic Panel: Recent Labs  Lab 06/21/24 2346 06/24/24 0324 06/25/24 0541  NA 140 139 142  K 4.0 3.9 4.7  CL 101 106 104  CO2 25 25 28   GLUCOSE 95 106* 90  BUN 20 29* 27*  CREATININE 0.89 1.42* 0.98  CALCIUM  9.6 8.6* 8.8*  MG 2.2  --   --    GFR: Estimated Creatinine Clearance: 76.4 mL/min (by C-G formula based on SCr of 0.98 mg/dL). Liver Function Tests: Recent Labs  Lab 06/21/24 2346  AST 33  ALT 19  ALKPHOS 86  BILITOT 1.1  PROT 8.4*  ALBUMIN 4.1   No results for input(s): LIPASE, AMYLASE in the last 168 hours. No results for input(s): AMMONIA in the last 168 hours. Coagulation Profile: No results for input(s): INR, PROTIME in the last 168 hours. Cardiac Enzymes: Recent Labs  Lab 06/21/24 2346  CKTOTAL 402*   BNP (last 3 results) No results  for input(s): PROBNP in the last 8760 hours. HbA1C: No results for input(s): HGBA1C in the last 72 hours. CBG: No results for input(s): GLUCAP in the last 168 hours. Lipid Profile: No results for input(s): CHOL, HDL, LDLCALC, TRIG, CHOLHDL, LDLDIRECT in the last 72 hours. Thyroid  Function Tests: No results for input(s): TSH, T4TOTAL, FREET4, T3FREE, THYROIDAB in the last 72 hours. Anemia Panel: No results for input(s): VITAMINB12, FOLATE, FERRITIN, TIBC, IRON, RETICCTPCT in the last 72 hours. Sepsis Labs: No results for input(s): PROCALCITON, LATICACIDVEN in the last 168 hours.  No results found for this or any previous visit (from the past 240  hours).       Radiology Studies: No results found.       Scheduled Meds:  amLODipine   5 mg Oral Daily   atorvastatin   40 mg Oral QHS   cloNIDine   0.1 mg Oral Daily   divalproex   500 mg Oral BID   enoxaparin  (LOVENOX ) injection  0.5 mg/kg Subcutaneous QHS   gabapentin   300 mg Oral q AM   gabapentin   900 mg Oral QHS   lacosamide   200 mg Oral BID   lamoTRIgine   200 mg Oral BID   montelukast   10 mg Oral QHS   nortriptyline   50 mg Oral QHS   mouth rinse  15 mL Mouth Rinse Q2H   pantoprazole   40 mg Oral Daily   QUEtiapine   25 mg Oral BID   risperiDONE   2 mg Oral QHS   Continuous Infusions:     LOS: 3 days        Anthony CHRISTELLA Pouch, MD [facility] Pager [phone]  If 7PM-7AM, please contact night-coverage [email or web address] 06/25/2024, 8:29 AM

## 2024-06-26 DIAGNOSIS — R569 Unspecified convulsions: Secondary | ICD-10-CM | POA: Diagnosis not present

## 2024-06-26 LAB — CBC
HCT: 37.1 % (ref 36.0–46.0)
Hemoglobin: 11.8 g/dL — ABNORMAL LOW (ref 12.0–15.0)
MCH: 27.5 pg (ref 26.0–34.0)
MCHC: 31.8 g/dL (ref 30.0–36.0)
MCV: 86.5 fL (ref 80.0–100.0)
Platelets: 253 K/uL (ref 150–400)
RBC: 4.29 MIL/uL (ref 3.87–5.11)
RDW: 17.2 % — ABNORMAL HIGH (ref 11.5–15.5)
WBC: 9.6 K/uL (ref 4.0–10.5)
nRBC: 0 % (ref 0.0–0.2)

## 2024-06-26 LAB — BASIC METABOLIC PANEL WITH GFR
Anion gap: 9 (ref 5–15)
BUN: 26 mg/dL — ABNORMAL HIGH (ref 6–20)
CO2: 25 mmol/L (ref 22–32)
Calcium: 9.3 mg/dL (ref 8.9–10.3)
Chloride: 105 mmol/L (ref 98–111)
Creatinine, Ser: 0.91 mg/dL (ref 0.44–1.00)
GFR, Estimated: >60 mL/min (ref >60)
Glucose, Bld: 106 mg/dL — ABNORMAL HIGH (ref 70–99)
Potassium: 4.5 mmol/L (ref 3.5–5.1)
Sodium: 139 mmol/L (ref 135–145)

## 2024-06-26 NOTE — Progress Notes (Signed)
 Occupational Therapy Treatment Patient Details Name: Kelly [name] MRN: [id number] DOB: Apr 23, 1966 Today's Date: 06/26/2024   History of present illness Merita Hawks is a 58 y.o. female with medical history significant of seizure, HTN, CAD MI, anxiety/depression, presented with seizure.   OT comments  Pt seen for OT/PT Cotx on this date with OT focus on ADL engagement in progressive standing while utilizing BUEs Upon arrival to room pt supine in bed, agreeable to tx. . OT focus on righting reactions in standing to support Pt progressing to upright standing posture that will allow for use of UE or BUEs during standing components of ADLs that require bilateral UE use (hand hygiene and grooming tasks at RW level while standing at sink).  Pt requires verbal, visual, tactile, and gestural cuing to promote understanding and carryover throughout session.  Ongoing barriers remain decreased safety awareness, impulsivity, fall risk, decreased righting reactions and decreased balance, lethargy, and decreased functional insight into current condition.  Pt making good progress toward goals, will continue to follow POC. Discharge recommendation remains appropriate.        If plan is discharge home, recommend the following:  A lot of help with walking and/or transfers;A lot of help with bathing/dressing/bathroom;Direct supervision/assist for medications management;Supervision due to cognitive status;Direct supervision/assist for financial management;Assist for transportation;Assistance with cooking/housework;Help with stairs or ramp for entrance   Equipment Recommendations  Other (comment)    Recommendations for Other Services      Precautions / Restrictions Precautions Precautions: Fall Precaution/Restrictions Comments: hx of seizure disorder Restrictions Weight Bearing Restrictions Per Provider Order: No       Mobility Bed Mobility Overal bed mobility: Modified Independent                   Transfers Overall transfer level: Needs assistance Equipment used: Rolling walker (2 wheels) Transfers: Sit to/from Stand Sit to Stand: Min assist, +2 physical assistance, +2 safety/equipment           General transfer comment: cuing required for safety with BUEs during ADL transfers, pt VERY impulsive with limited safety awareness     Balance Overall balance assessment: Needs assistance Sitting-balance support: Feet supported, Single extremity supported Sitting balance-Leahy Scale: Fair Sitting balance - Comments: lateral leaning Postural control: Left lateral lean Standing balance support: Single extremity supported, Bilateral upper extremity supported, No upper extremity supported, During functional activity Standing balance-Leahy Scale: Poor Standing balance comment: decreased motor planning, limited ability to engage righting reactions in sitting and standing with a L lateral lean noted and decreased                           ADL either performed or assessed with clinical judgement   ADL Overall ADL's : Needs assistance/impaired Eating/Feeding: Set up   Grooming: Wash/dry hands;Standing Grooming Details (indicate cue type and reason): L lateral lean present without pt demonstrating righting reactions to sustain upright standing posture                             Functional mobility during ADLs: Minimal assistance;+2 for physical assistance;+2 for safety/equipment;Cueing for safety;Rolling walker (2 wheels)      Extremity/Trunk Assessment Upper Extremity Assessment Upper Extremity Assessment: Generalized weakness            Vision       Perception     Praxis     Communication Communication Communication: Impaired Factors Affecting Communication: Difficulty  expressing self;Reduced clarity of speech   Cognition Arousal: Lethargic Behavior During Therapy: Impulsive Cognition: No family/caregiver present to determine baseline              OT - Cognition Comments: Impaired processing, limitations in safety awareness, very impulsive                 Following commands: Impaired Following commands impaired: Follows one step commands inconsistently      Cueing   Cueing Techniques: Verbal cues, Gestural cues, Tactile cues, Visual cues  Exercises Other Exercises Other Exercises: Education on righting reactions in preparation for standing components of ADLs that require bilateral hand use, fall risk management, use of BUEs to increase stability during standing components of ADLs    Shoulder Instructions       General Comments      Pertinent Vitals/ Pain       Pain Assessment Pain Assessment: No/denies pain  Home Living                                          Prior Functioning/Environment              Frequency  Min 2X/week        Progress Toward Goals  OT Goals(current goals can now be found in the care plan section)  Progress towards OT goals: Progressing toward goals     Plan      Co-evaluation    PT/OT/SLP Co-Evaluation/Treatment: Yes Reason for Co-Treatment: Complexity of the patient's impairments (multi-system involvement);Necessary to address cognition/behavior during functional activity;For patient/therapist safety PT goals addressed during session: Mobility/safety with mobility;Balance OT goals addressed during session: ADL's and self-care;Proper use of Adaptive equipment and DME      AM-PAC OT 6 Clicks Daily Activity     Outcome Measure   Help from another person eating meals?: None Help from another person taking care of personal grooming?: A Little Help from another person toileting, which includes using toliet, bedpan, or urinal?: A Lot Help from another person bathing (including washing, rinsing, drying)?: A Little Help from another person to put on and taking off regular upper body clothing?: A Little Help from another person to put on and  taking off regular lower body clothing?: A Lot 6 Click Score: 17    End of Session Equipment Utilized During Treatment: Gait belt;Rolling walker (2 wheels)  OT Visit Diagnosis: Other abnormalities of gait and mobility (R26.89);History of falling (Z91.81);Muscle weakness (generalized) (M62.81);Other symptoms and signs involving cognitive function   Activity Tolerance Patient limited by lethargy;Patient tolerated treatment well   Patient Left in chair;with call bell/phone within reach;with chair alarm set   Nurse Communication Mobility status        Time: 8690-8659 OT Time Calculation (min): 31 min  Charges: OT General Charges $OT Visit: 1 Visit OT Treatments $Therapeutic Activity: 23-37 mins  Harlene Sharps OTR/L   Harlene LITTIE Sharps 06/26/2024, 2:03 PM

## 2024-06-26 NOTE — Plan of Care (Signed)

## 2024-06-26 NOTE — Progress Notes (Signed)
 PROGRESS NOTE    Kelly [name]  FMW:[id number] DOB: October 29, 1966 DOA: 06/21/2024 PCP: Wellington Curtis LABOR, FNP   Assessment & Plan:   Principal Problem:   Seizure (HCC) Active Problems:   Seizures (HCC)   AKI (acute kidney injury) (HCC)  Assessment and Plan: Seizure: etiology unclear, likely secondary to noncoherent with seizure medications. Mental status waxing and waning today. Continue on home dose of depakote , lamictal , vimpat  & gabapentin . CT head shows no acute intracranial abnormalities   Generalized weakness: PT/OT recs SNF. Pt is agreeable to SNF    Leukocytosis: resolved   Depression: severity unknown. Continue on home dose of risperdal . Pt refused to take seroquel , stating she didn't like the way it made her feel. Seroquel  was d/c as per pt's request   HTN: continue on home dose of amlodipine , losartan , clonidine     Obesity: BMI 34.7. Would benefit from weight loss       DVT prophylaxis: lovenox  Code Status: full  Family Communication:  Disposition Plan: likely d/c to SNF  Level of care: Telemetry Medical  Status is: Inpatient Remains inpatient appropriate because: still needs SNF placement     Consultants:    Procedures:   Antimicrobials:    Subjective: Pt c/o feeling groggy.   Objective: Vitals:   06/25/24 2049 06/26/24 0004 06/26/24 0424 06/26/24 0819  BP: 134/78 (!) 149/64 (!) 150/64 (!) 150/67  Pulse: 89 96 (!) 102 87  Resp:    20  Temp: 97.6 F (36.4 C) 97.8 F (36.6 C) 97.9 F (36.6 C) 98 F (36.7 C)  TempSrc: Oral Oral Oral Oral  SpO2: 99% 93% 96% 99%  Weight:      Height:        Intake/Output Summary (Last 24 hours) at 06/26/2024 9177 Last data filed at 06/25/2024 1855 Gross per 24 hour  Intake 720 ml  Output --  Net 720 ml   Filed Weights   06/21/24 2351  Weight: 97.5 kg    Examination:  General exam: appears calm & comfortable  Respiratory system: diminished breath sounds b/l  Cardiovascular system:S1 & S2+. No  rubs or clicks   Gastrointestinal system: abd is soft, NT, obese & hypoactive bowel sounds Central nervous system: alert & awake. Moves all extremities   Psychiatry: Judgement and insight appears at baseline. Flat mood and affect    Data Reviewed: I have personally reviewed following labs and imaging studies  CBC: Recent Labs  Lab 06/21/24 2346 06/23/24 0517 06/24/24 0324 06/25/24 0541 06/26/24 0432  WBC 14.6* 11.8* 10.2 10.0 9.6  NEUTROABS 11.0*  --   --   --   --   HGB 12.6 10.8* 10.5* 10.7* 11.8*  HCT 38.5 33.8* 32.8* 33.3* 37.1  MCV 83.7 84.7 84.8 84.5 86.5  PLT 255 221 237 246 253   Basic Metabolic Panel: Recent Labs  Lab 06/21/24 2346 06/24/24 0324 06/25/24 0541 06/26/24 0432  NA 140 139 142 139  K 4.0 3.9 4.7 4.5  CL 101 106 104 105  CO2 25 25 28 25   GLUCOSE 95 106* 90 106*  BUN 20 29* 27* 26*  CREATININE 0.89 1.42* 0.98 0.91  CALCIUM  9.6 8.6* 8.8* 9.3  MG 2.2  --   --   --    GFR: Estimated Creatinine Clearance: 82.3 mL/min (by C-G formula based on SCr of 0.91 mg/dL). Liver Function Tests: Recent Labs  Lab 06/21/24 2346  AST 33  ALT 19  ALKPHOS 86  BILITOT 1.1  PROT 8.4*  ALBUMIN 4.1  No results for input(s): LIPASE, AMYLASE in the last 168 hours. No results for input(s): AMMONIA in the last 168 hours. Coagulation Profile: No results for input(s): INR, PROTIME in the last 168 hours. Cardiac Enzymes: Recent Labs  Lab 06/21/24 2346  CKTOTAL 402*   BNP (last 3 results) No results for input(s): PROBNP in the last 8760 hours. HbA1C: No results for input(s): HGBA1C in the last 72 hours. CBG: No results for input(s): GLUCAP in the last 168 hours. Lipid Profile: No results for input(s): CHOL, HDL, LDLCALC, TRIG, CHOLHDL, LDLDIRECT in the last 72 hours. Thyroid  Function Tests: No results for input(s): TSH, T4TOTAL, FREET4, T3FREE, THYROIDAB in the last 72 hours. Anemia Panel: No results for input(s):  VITAMINB12, FOLATE, FERRITIN, TIBC, IRON, RETICCTPCT in the last 72 hours. Sepsis Labs: No results for input(s): PROCALCITON, LATICACIDVEN in the last 168 hours.  No results found for this or any previous visit (from the past 240 hours).       Radiology Studies: No results found.       Scheduled Meds:  amLODipine   5 mg Oral Daily   atorvastatin   40 mg Oral QHS   cloNIDine   0.1 mg Oral Daily   divalproex   500 mg Oral BID   docusate sodium   200 mg Oral BID   enoxaparin  (LOVENOX ) injection  0.5 mg/kg Subcutaneous QHS   gabapentin   300 mg Oral q AM   gabapentin   900 mg Oral QHS   lacosamide   200 mg Oral BID   lamoTRIgine   200 mg Oral BID   montelukast   10 mg Oral QHS   nortriptyline   50 mg Oral QHS   pantoprazole   40 mg Oral Daily   polyethylene glycol  17 g Oral Daily   QUEtiapine   25 mg Oral BID   risperiDONE   2 mg Oral QHS   Continuous Infusions:     LOS: 4 days        Anthony CHRISTELLA Pouch, MD [facility] Pager [phone]  If 7PM-7AM, please contact night-coverage [email or web address] 06/26/2024, 8:22 AM

## 2024-06-26 NOTE — Care Management Important Message (Signed)
 Important Message  Patient Details  Name: Kelly [name] MRN: [id number] Date of Birth: [birth year]   Important Message Given:  Yes - Medicare IM     Trestan Vahle W, CMA 06/26/2024, 8:22 AM

## 2024-06-27 DIAGNOSIS — R569 Unspecified convulsions: Secondary | ICD-10-CM | POA: Diagnosis not present

## 2024-06-27 LAB — BASIC METABOLIC PANEL WITH GFR
Anion gap: 11 (ref 5–15)
BUN: 23 mg/dL — ABNORMAL HIGH (ref 6–20)
CO2: 28 mmol/L (ref 22–32)
Calcium: 9.4 mg/dL (ref 8.9–10.3)
Chloride: 102 mmol/L (ref 98–111)
Creatinine, Ser: 0.86 mg/dL (ref 0.44–1.00)
GFR, Estimated: >60 mL/min (ref >60)
Glucose, Bld: 86 mg/dL (ref 70–99)
Potassium: 4.9 mmol/L (ref 3.5–5.1)
Sodium: 141 mmol/L (ref 135–145)

## 2024-06-27 LAB — CBC
HCT: 35 % — ABNORMAL LOW (ref 36.0–46.0)
Hemoglobin: 10.9 g/dL — ABNORMAL LOW (ref 12.0–15.0)
MCH: 26.6 pg (ref 26.0–34.0)
MCHC: 31.1 g/dL (ref 30.0–36.0)
MCV: 85.4 fL (ref 80.0–100.0)
Platelets: 269 K/uL (ref 150–400)
RBC: 4.1 MIL/uL (ref 3.87–5.11)
RDW: 16.8 % — ABNORMAL HIGH (ref 11.5–15.5)
WBC: 9.6 K/uL (ref 4.0–10.5)
nRBC: 0 % (ref 0.0–0.2)

## 2024-06-27 NOTE — Progress Notes (Signed)
   06/26/24 2348  Assess: MEWS Score  Temp 97.8 F (36.6 C)  BP (!) 164/70  MAP (mmHg) 95  Pulse Rate (!) 122  Resp 20  SpO2 98 %  O2 Device Room Air  Assess: MEWS Score  MEWS Temp 0  MEWS Systolic 0  MEWS Pulse 2  MEWS RR 0  MEWS LOC 0  MEWS Score 2  MEWS Score Color Yellow  Assess: if the MEWS score is Yellow or Red  Were vital signs accurate and taken at a resting state? No, vital signs rechecked  Does the patient meet 2 or more of the SIRS criteria? No  MEWS guidelines implemented  Yes, yellow  Treat  MEWS Interventions Considered administering scheduled or prn medications/treatments as ordered  Take Vital Signs  Increase Vital Sign Frequency  Yellow: Q2hr x1, continue Q4hrs until patient remains green for 12hrs  Escalate  MEWS: Escalate Yellow: Discuss with charge nurse and consider notifying provider and/or RRT  Notify: Charge Nurse/RN  Name of Charge Nurse/RN Notified Arland Grizzle RN  Provider Notification  Provider Name/Title Dr. delayne Solian  Date Provider Notified 06/27/24  Time Provider Notified 0015  Method of Notification Page (secured chat)  Notification Reason Change in status (yellow mews and fall incident)  Provider response No new orders  Assess: SIRS CRITERIA  SIRS Temperature  0  SIRS Respirations  0  SIRS Pulse 1  SIRS WBC 0  SIRS Score Sum  1

## 2024-06-27 NOTE — Progress Notes (Signed)
 PROGRESS NOTE   HPI was taken from Dr. Laurita: Kelly [name] is a 58 y.o. female with medical history significant of seizure, HTN, CAD MI, anxiety/depression, presented with seizure.   Patient lives by herself, with neighbors visit her on daily basis.  She has a seizure disorder but appears to be poorly controlled, as outpatient record showed that she had two episode of seizure in July of this year.  Patient insisted that she has been compliant with her seizure medications.  This time, patient started to have malaise, nausea 2 days ago, as a result, she has had decreased oral intake but she insists that she has been taking all her medications.  Yesterday evening, her neighbor went to see her and found the patient on the floor unresponsive, patient however did not remember what had happened before her neighbor arrived.  But she did have a lip bite mark but denied any urine or bowel movement incontinence.  Patient complained about left forearm pain, which she attributed to possible fall yesterday evening.  Denied any chest pain shortness of breath abdominal pain diarrhea fever or chills. ED Course: Afebrile, blood and tachycardia blood pressure 150/80 O2 saturation 99% on room air.  CT head and neck negative for intracranial abnormalities or fracture or traumatic findings on cervical spine CT.  Blood work showed lower than normal valproic  acid level, BUN 20 creatinine 0.8 WBC 14.6 hemoglobin 12.8.   Patient was given IV loading 750 mg valproate   Zona Pedro  FMW:[id number] DOB: 07-09-1966 DOA: 06/21/2024 PCP: Wellington Curtis LABOR, FNP   Assessment & Plan:   Principal Problem:   Seizure (HCC) Active Problems:   Seizures (HCC)   AKI (acute kidney injury) (HCC)  Assessment and Plan: Seizure: etiology unclear, likely secondary to noncoherent with seizure medications. Mental status waxing and waning today. Continue on home dose of vimpat , lamictal , depakote  & gabapentin . CT head shows no acute  intracranial abnormalities   Generalized weakness: PT/OT recs SNF. Waiting on SNF placement still    Leukocytosis: resolved   Depression: severity unknown. Continue on home dose of risperdal . Pt refused to take seroquel , stating she didn't like the way it made her feel. Seroquel  was d/c as per pt's request   HTN: continue on home dose of amlodipine , losartan , clonidine     Obesity: BMI 34.7. Would benefit from weight loss       DVT prophylaxis: lovenox  Code Status: full  Family Communication:  Disposition Plan: likely d/c to SNF  Level of care: Telemetry Medical  Status is: Inpatient Remains inpatient appropriate because: medically stable. Waiting on SNF placement still, CM is working on this     Consultants:    Procedures:   Antimicrobials:    Subjective: Pt c/o generalized weakness  Objective: Vitals:   06/27/24 0123 06/27/24 0525 06/27/24 0532 06/27/24 0534  BP: 131/77 (!) 89/41 (!) 91/40 (!) 114/57  Pulse: (!) 115 86 86 85  Resp: 18 16 18 18   Temp: 98 F (36.7 C) (!) 97.3 F (36.3 C) 98 F (36.7 C) 98 F (36.7 C)  TempSrc:      SpO2: 95% 95% 98% 98%  Weight:      Height:        Intake/Output Summary (Last 24 hours) at 06/27/2024 0828 Last data filed at 06/26/2024 1900 Gross per 24 hour  Intake 480 ml  Output --  Net 480 ml   Filed Weights   06/21/24 2351  Weight: 97.5 kg    Examination:  General exam: appears  frustrated Respiratory system: decreased breath sounds b/l  Cardiovascular system: S1/S2+. No rubs or gallops Gastrointestinal system: abd is soft, NT, obese & hypoactive bowel sounds  Central nervous system: alert & awake. Moves all extremities  Psychiatry: Judgement and insight appears at baseline. Flat mood and affect    Data Reviewed: I have personally reviewed following labs and imaging studies  CBC: Recent Labs  Lab 06/21/24 2346 06/23/24 0517 06/24/24 0324 06/25/24 0541 06/26/24 0432 06/27/24 0555  WBC 14.6* 11.8*  10.2 10.0 9.6 9.6  NEUTROABS 11.0*  --   --   --   --   --   HGB 12.6 10.8* 10.5* 10.7* 11.8* 10.9*  HCT 38.5 33.8* 32.8* 33.3* 37.1 35.0*  MCV 83.7 84.7 84.8 84.5 86.5 85.4  PLT 255 221 237 246 253 269   Basic Metabolic Panel: Recent Labs  Lab 06/21/24 2346 06/24/24 0324 06/25/24 0541 06/26/24 0432 06/27/24 0555  NA 140 139 142 139 141  K 4.0 3.9 4.7 4.5 4.9  CL 101 106 104 105 102  CO2 25 25 28 25 28   GLUCOSE 95 106* 90 106* 86  BUN 20 29* 27* 26* 23*  CREATININE 0.89 1.42* 0.98 0.91 0.86  CALCIUM  9.6 8.6* 8.8* 9.3 9.4  MG 2.2  --   --   --   --    GFR: Estimated Creatinine Clearance: 87 mL/min (by C-G formula based on SCr of 0.86 mg/dL). Liver Function Tests: Recent Labs  Lab 06/21/24 2346  AST 33  ALT 19  ALKPHOS 86  BILITOT 1.1  PROT 8.4*  ALBUMIN 4.1   No results for input(s): LIPASE, AMYLASE in the last 168 hours. No results for input(s): AMMONIA in the last 168 hours. Coagulation Profile: No results for input(s): INR, PROTIME in the last 168 hours. Cardiac Enzymes: Recent Labs  Lab 06/21/24 2346  CKTOTAL 402*   BNP (last 3 results) No results for input(s): PROBNP in the last 8760 hours. HbA1C: No results for input(s): HGBA1C in the last 72 hours. CBG: No results for input(s): GLUCAP in the last 168 hours. Lipid Profile: No results for input(s): CHOL, HDL, LDLCALC, TRIG, CHOLHDL, LDLDIRECT in the last 72 hours. Thyroid  Function Tests: No results for input(s): TSH, T4TOTAL, FREET4, T3FREE, THYROIDAB in the last 72 hours. Anemia Panel: No results for input(s): VITAMINB12, FOLATE, FERRITIN, TIBC, IRON, RETICCTPCT in the last 72 hours. Sepsis Labs: No results for input(s): PROCALCITON, LATICACIDVEN in the last 168 hours.  No results found for this or any previous visit (from the past 240 hours).       Radiology Studies: No results found.       Scheduled Meds:  amLODipine   5 mg Oral  Daily   atorvastatin   40 mg Oral QHS   cloNIDine   0.1 mg Oral Daily   divalproex   500 mg Oral BID   docusate sodium   200 mg Oral BID   enoxaparin  (LOVENOX ) injection  0.5 mg/kg Subcutaneous QHS   gabapentin   300 mg Oral q AM   gabapentin   900 mg Oral QHS   lacosamide   200 mg Oral BID   lamoTRIgine   200 mg Oral BID   montelukast   10 mg Oral QHS   nortriptyline   50 mg Oral QHS   pantoprazole   40 mg Oral Daily   polyethylene glycol  17 g Oral Daily   risperiDONE   2 mg Oral QHS   Continuous Infusions:     LOS: 5 days        [employer]  CHRISTELLA Pouch, MD [facility] Pager [phone]  If 7PM-7AM, please contact night-coverage [email or web address] 06/27/2024, 8:28 AM

## 2024-06-27 NOTE — [place] FL2 (Signed)
 [place]  MEDICAID FL2 LEVEL OF CARE FORM     IDENTIFICATION  Patient Name: Kelly [name] Birthdate: 01-25-1966 Sex: female Admission Date (Current Location): 06/21/2024  [facility] and [place] Number:      Facility and Address:  [facility], [address], [place], [place] [address]      Provider Number: [id number]  Attending Physician Name and Address:  Trudy Anthony HERO, MD  Relative Name and Phone Number:       Current Level of Care: Hospital Recommended Level of Care: Skilled Nursing Facility Prior Approval Number:    Date Approved/Denied:   PASRR Number:    Discharge Plan: SNF    Current Diagnoses: Patient Active Problem List   Diagnosis Date Noted   Seizure (HCC) 06/22/2024   AKI (acute kidney injury) (HCC) 06/22/2024   Suicidal ideations 04/11/2024   Mixed hyperlipidemia 12/20/2023   Class 2 obesity without serious comorbidity with body mass index (BMI) of 35.0 to 35.9 in adult 12/20/2023   Occasional tremors 12/20/2023   Mild intermittent asthma 12/20/2023   Migraine without status migrainosus, not intractable 12/20/2023   Frequent falls 12/20/2023   Medication regimen deficit 12/20/2023   Sleep apnea    Hypertension    Seizures (HCC)    Schizoaffective disorder (HCC) 04/08/2016   Acute anxiety 04/08/2016    Orientation RESPIRATION BLADDER Height & Weight     Self, Time, Situation    Continent Weight: 97.5 kg Height:  5' 6 (167.6 cm)  BEHAVIORAL SYMPTOMS/MOOD NEUROLOGICAL BOWEL NUTRITION STATUS      Continent    AMBULATORY STATUS COMMUNICATION OF NEEDS Skin   Limited Assist                           Personal Care Assistance Level of Assistance  Bathing, Feeding, Dressing Bathing Assistance: Limited assistance Feeding assistance: Independent Dressing Assistance: Limited assistance     Functional Limitations Info  Hearing, Speech, Sight Sight Info: Adequate Hearing Info: Adequate Speech Info: Adequate     SPECIAL CARE FACTORS FREQUENCY  PT (By licensed PT), OT (By licensed OT)     PT Frequency: 5 x week OT Frequency: 5 x week            Contractures      Additional Factors Info  Code Status, Allergies Code Status Info: FULL Allergies Info: Percocet           Current Medications (06/27/2024):  This is the current hospital active medication list Current Facility-Administered Medications  Medication Dose Route Frequency Provider Last Rate Last Admin   acetaminophen  (TYLENOL ) tablet 650 mg  650 mg Oral Q6H PRN Laurita Manor T, MD   650 mg at 06/24/24 1041   albuterol  (PROVENTIL ) (2.5 MG/3ML) 0.083% nebulizer solution 2.5 mg  2.5 mg Inhalation Q6H PRN Laurita Manor T, MD       amLODipine  (NORVASC ) tablet 5 mg  5 mg Oral Daily Laurita Manor T, MD   5 mg at 06/26/24 0805   atorvastatin  (LIPITOR) tablet 40 mg  40 mg Oral QHS Laurita Manor T, MD   40 mg at 06/26/24 2106   cloNIDine  (CATAPRES ) tablet 0.1 mg  0.1 mg Oral Daily Laurita Manor T, MD   0.1 mg at 06/26/24 9193   divalproex  (DEPAKOTE ) DR tablet 500 mg  500 mg Oral BID Laurita Manor T, MD   500 mg at 06/26/24 2105   docusate sodium  (COLACE) capsule 200 mg  200 mg Oral BID  Trudy Anthony HERO, MD   200 mg at 06/26/24 2106   enoxaparin  (LOVENOX ) injection 50 mg  0.5 mg/kg Subcutaneous QHS Laurita Manor T, MD   50 mg at 06/26/24 2107   gabapentin  (NEURONTIN ) capsule 300 mg  300 mg Oral q AM Laurita Manor T, MD   300 mg at 06/26/24 0805   gabapentin  (NEURONTIN ) capsule 900 mg  900 mg Oral QHS Laurita Manor T, MD   900 mg at 06/26/24 2106   lacosamide  (VIMPAT ) tablet 200 mg  200 mg Oral BID Laurita Manor T, MD   200 mg at 06/26/24 2106   lamoTRIgine  (LAMICTAL ) tablet 200 mg  200 mg Oral BID Laurita Manor T, MD   200 mg at 06/26/24 2106   metoprolol  tartrate (LOPRESSOR ) injection 5 mg  5 mg Intravenous Q6H PRN Trudy Anthony HERO, MD       montelukast  (SINGULAIR ) tablet 10 mg  10 mg Oral QHS Laurita Manor T, MD   10 mg at 06/26/24 2106   nortriptyline   (PAMELOR ) capsule 50 mg  50 mg Oral QHS Laurita Manor T, MD   50 mg at 06/26/24 2106   ondansetron  (ZOFRAN ) injection 4 mg  4 mg Intravenous Q6H PRN Laurita Manor T, MD   4 mg at 06/24/24 1327   Oral care mouth rinse  15 mL Mouth Rinse PRN Laurita Manor T, MD       pantoprazole  (PROTONIX ) EC tablet 40 mg  40 mg Oral Daily Laurita Manor T, MD   40 mg at 06/26/24 0805   polyethylene glycol (MIRALAX  / GLYCOLAX ) packet 17 g  17 g Oral Daily Trudy Anthony HERO, MD   17 g at 06/26/24 9193   risperiDONE  (RISPERDAL ) tablet 2 mg  2 mg Oral QHS Laurita Manor DASEN, MD   2 mg at 06/26/24 2106     Discharge Medications: Please see discharge summary for a list of discharge medications.  Relevant Imaging Results:  Relevant Lab Results:   Additional Information [id number]  Dalia GORMAN Fuse, RN

## 2024-06-27 NOTE — TOC Initial Note (Signed)
 Transition of Care [facility]) - Initial/Assessment Note    Patient Details  Name: Kelly [name] MRN: [id number] Date of Birth: 1966-09-03  [facility] CM/SW Contact:    Dalia GORMAN Fuse, RN Phone Number: 06/27/2024, 12:58 PM  Clinical Narrative:                 Patient is from home alone and has no family in the area. Therapy recommends SNF. For STR. FL2 sent out in [place], [facility] offered a bed. TOC spoke with the patient and she would like to accept the offer. TOC outreached to [place] at [facility] to make him aware. He can admit the patient tomorrow.  Approved JluyPI:[id number] Dates: 8/12-8/14/2025 Next Review Date: 06/29/2024   Expected Discharge Plan: Skilled Nursing Facility Barriers to Discharge: Continued Medical Work up   Patient Goals and CMS Choice            Expected Discharge Plan and Services                                     HH Agency: [facility] Date [facility] Agency Contacted: 06/26/24 Time HH Agency Contacted: 1257 Representative spoke with at [facility] Agency: Chiquita  Prior Living Arrangements/Services   Lives with:: Adult Children                   Activities of Daily Living   ADL Screening (condition at time of admission) Independently performs ADLs?: Yes (appropriate for developmental age) Is the patient deaf or have difficulty hearing?: No Does the patient have difficulty seeing, even when wearing glasses/contacts?: No Does the patient have difficulty concentrating, remembering, or making decisions?: No  Permission Sought/Granted                  Emotional Assessment           Psych Involvement: No (comment)  Admission diagnosis:  Seizure (HCC) [R56.9] Altered mental status, unspecified altered mental status type [R41.82] AKI (acute kidney injury) (HCC) [N17.9] Patient Active Problem List   Diagnosis Date Noted   Seizure (HCC) 06/22/2024   AKI (acute kidney injury) (HCC) 06/22/2024   Suicidal ideations  04/11/2024   Mixed hyperlipidemia 12/20/2023   Class 2 obesity without serious comorbidity with body mass index (BMI) of 35.0 to 35.9 in adult 12/20/2023   Occasional tremors 12/20/2023   Mild intermittent asthma 12/20/2023   Migraine without status migrainosus, not intractable 12/20/2023   Frequent falls 12/20/2023   Medication regimen deficit 12/20/2023   Sleep apnea    Hypertension    Seizures (HCC)    Schizoaffective disorder (HCC) 04/08/2016   Acute anxiety 04/08/2016   PCP:  Wellington Curtis LABOR, FNP Pharmacy:   [facility] - [place], [place] - [address] [place] [address] Phone: [phone] Fax: [phone]  [id number] - [place], [place] - [address] [place] [address] Phone: [phone] Fax: [phone]     Social Drivers of Health (SDOH) Social History: SDOH Screenings   Food Insecurity: Patient Declined (06/22/2024)  Housing: Patient Declined (06/22/2024)  Transportation Needs: Patient Declined (06/22/2024)  Utilities: Patient Declined (06/22/2024)  Alcohol Screen: Low Risk  (01/12/2024)  Depression (PHQ2-9): High Risk (05/30/2024)  Financial Resource Strain: Low Risk  (01/12/2024)  Physical Activity: Inactive (01/12/2024)  Social Connections: Patient Declined (06/22/2024)  Stress: No Stress  Concern Present (01/12/2024)  Tobacco Use: High Risk (06/22/2024)  Health Literacy: Adequate Health Literacy (01/12/2024)   SDOH Interventions:     Readmission Risk Interventions     No data to display

## 2024-06-27 NOTE — Progress Notes (Signed)
 Physical Therapy Treatment Patient Details Name: Kelly [name] MRN: [id number] DOB: July 25, 1966 Today's Date: 06/27/2024   History of Present Illness Kelly [name] is a 58 y.o. female with medical history significant of seizure, HTN, CAD MI, anxiety/depression, presented with seizure.    PT Comments  Pt was long sitting in bed upon arrival. She is alert but only truly oriented x 2. She lacks good insight of her deficits and POC going forward. Author reviewed care plan and that she had received insurance auth to DC to [facility].  I just want to get out of here and get better so I can go home. Pt endorses feeling and moving better today versus yesterday. She was able to exit R side of bed without physical assistance. Does required vcs for technique and safety improvements but overall no physical assist to exit R side of bed. Pt stood 3 x total at EOB prior to standing and taking ~ 5 steps with RW to recliner. LLE buckling noted but no intervention required. Author issued exercises to promote LLE strengthening and improved coordination. Dc recs remain appropriate. Acute PT will continue to follow and progress per current POC.    If plan is discharge home, recommend the following: A lot of help with walking and/or transfers;A lot of help with bathing/dressing/bathroom;Assistance with cooking/housework;Direct supervision/assist for medications management;Direct supervision/assist for financial management;Assist for transportation;Help with stairs or ramp for entrance;Supervision due to cognitive status     Equipment Recommendations  Other (comment) (defer to next level of care)       Precautions / Restrictions Precautions Precautions: Fall Precaution/Restrictions Comments: hx of seizure disorder Restrictions Weight Bearing Restrictions Per Provider Order: No     Mobility  Bed Mobility Overal bed mobility: Modified Independent  Transfers Overall transfer level: Needs assistance Equipment  used: Rolling walker (2 wheels) Transfers: Sit to/from Stand Sit to Stand: Min assist, Mod assist  General transfer comment: min from elevated surface height, mod assist from lowest bed surface    Ambulation/Gait Ambulation/Gait assistance: Mod assist Gait Distance (Feet): 5 Feet Assistive device: Rolling walker (2 wheels) Gait Pattern/deviations: Step-to pattern, Knees buckling, Staggering left Gait velocity: decreased  General Gait Details: LLE knee buckling however per pt, not as bad as yesterday. She was able to advance to ambulation ~ 5 ft with chair follow for additional safety.   Balance Overall balance assessment: Needs assistance Sitting-balance support: Feet supported, Single extremity supported Sitting balance-Leahy Scale: Fair     Standing balance support: Bilateral upper extremity supported, During functional activity, Reliant on assistive device for balance Standing balance-Leahy Scale: Poor Standing balance comment: pt is at high risk of falls.     Communication Communication Communication: No apparent difficulties Factors Affecting Communication: Other (comment) (word finding difficulty a few times but with increased time and vcs for stopping and repeating again, was able to express what she was trying to say appropriately)  Cognition Arousal: Alert Behavior During Therapy: WFL for tasks assessed/performed   PT - Cognitive impairments: Awareness, Problem solving, Safety/Judgement, Sequencing, Orientation   Orientation impairments: Situation, Time    PT - Cognition Comments: Pt was long sitting in bed upon arrival. She is alert and O x 2. Does have poor overall awareness of situation and poor safety awareness. Chair alarm in place at conclusion of session. Following commands: Impaired Following commands impaired: Follows one step commands inconsistently    Cueing Cueing Techniques: Verbal cues, Tactile cues     General Comments General comments (skin  integrity, edema, etc.):  Author issued exercises to promote improved LLE strength and coordination. Pt does have R should deficits (OA?) which impacts session progression in addition to New onset of symptoms      Pertinent Vitals/Pain Pain Assessment Pain Assessment: 0-10 Pain Score: 4  Pain Location: R shoulder pain Pain Descriptors / Indicators: Discomfort, Aching Pain Intervention(s): Monitored during session, Limited activity within patient's tolerance, Repositioned     PT Goals (current goals can now be found in the care plan section) Acute Rehab PT Goals Patient Stated Goal: get better and go home. Progress towards PT goals: Progressing toward goals    Frequency    Min 3X/week       Co-evaluation     PT goals addressed during session: Mobility/safety with mobility;Balance;Proper use of DME;Strengthening/ROM        AM-PAC PT 6 Clicks Mobility   Outcome Measure  Help needed turning from your back to your side while in a flat bed without using bedrails?: A Little Help needed moving from lying on your back to sitting on the side of a flat bed without using bedrails?: A Little Help needed moving to and from a bed to a chair (including a wheelchair)?: A Lot Help needed standing up from a chair using your arms (e.g., wheelchair or bedside chair)?: A Lot Help needed to walk in hospital room?: A Lot Help needed climbing 3-5 steps with a railing? : A Lot 6 Click Score: 14    End of Session   Activity Tolerance: Patient tolerated treatment well Patient left: in chair;with call bell/phone within reach;with chair alarm set Nurse Communication: Mobility status PT Visit Diagnosis: Muscle weakness (generalized) (M62.81);Other abnormalities of gait and mobility (R26.89)     Time: 8644-8591 PT Time Calculation (min) (ACUTE ONLY): 13 min  Charges:    $Therapeutic Activity: 8-22 mins PT General Charges $$ ACUTE PT VISIT: 1 Visit                     Rankin Essex  PTA 06/27/24, 2:24 PM

## 2024-06-27 NOTE — Plan of Care (Signed)
  Problem: Education: Goal: Knowledge of General Education information will improve Description: Including pain rating scale, medication(s)/side effects and non-pharmacologic comfort measures Outcome: Progressing   Problem: Pain Managment: Goal: General experience of comfort will improve and/or be controlled Outcome: Progressing

## 2024-06-27 NOTE — TOC Initial Note (Signed)
 Transition of Care [facility]) - Initial/Assessment Note    Patient Details  Name: Kelly [name] MRN: [id number] Date of Birth: 08/02/1966  Transition of Care [facility]) CM/SW Contact:    Dalia GORMAN Fuse, RN Phone Number: 06/27/2024, 12:58 PM  Clinical Narrative:                 Patient is from home alone and has no family in the area. Therapy recommends SNF. For STR. FL2 sent out in [place], [facility] offered a bed. TOC spoke with the patient and she would like to accept the offer. TOC outreached to D[place]ne [place] at [facility] to make him aware. He can admit the patient tomorrow.  Approved [id number] Dates: 8/12-8/14/2025 Next Review Date: 06/29/2024   Expected Discharge Plan: Skilled Nursing Facility Barriers to Discharge: Continued Medical Work up   Patient Goals and CMS Choice            Expected Discharge Plan and Services                                     HH Agency: [facility] Date [facility] Agency Contacted: 06/26/24 Time HH Agency Contacted: [phone] Representative spoke with at [facility] Agency: [place]  Prior Living Arrangements/Services   Lives with:: Adult Children                   Activities of Daily Living   ADL Screening (condition at time of admission) Independently performs ADLs?: Yes (appropriate for developmental age) Is the patient deaf or have difficulty hearing?: No Does the patient have difficulty seeing, even when wearing glasses/contacts?: No Does the patient have difficulty concentrating, remembering, or making decisions?: No  Permission Sought/Granted                  Emotional Assessment           Psych Involvement: No (comment)  Admission diagnosis:  Seizure (HCC) [R56.9] Altered mental status, unspecified altered mental status type [R41.82] AKI (acute kidney injury) (HCC) [N17.9] Patient Active Problem List   Diagnosis Date Noted   Seizure (HCC) 06/22/2024   AKI (acute kidney injury) (HCC) 06/22/2024   Suicidal ideations  04/11/2024   Mixed hyperlipidemia 12/20/2023   Class 2 obesity without serious comorbidity with body mass index (BMI) of 35.0 to 35.9 in adult 12/20/2023   Occasional tremors 12/20/2023   Mild intermittent asthma 12/20/2023   Migraine without status migrainosus, not intractable 12/20/2023   Frequent falls 12/20/2023   Medication regimen deficit 12/20/2023   Sleep apnea    Hypertension    Seizures (HCC)    Schizoaffective disorder (HCC) 04/08/2016   Acute anxiety 04/08/2016   PCP:  Wellington Curtis LABOR, FNP Pharmacy:   [facility] - [place], [place] - [address] [place] [address] Phone: [phone] Fax: [phone]  [id number] - [place], [place] - [address] [place] [address] Phone: [phone] Fax: [phone]     Social Drivers of Health (SDOH) Social History: SDOH Screenings   Food Insecurity: Patient Declined (06/22/2024)  Housing: Patient Declined (06/22/2024)  Transportation Needs: Patient Declined (06/22/2024)  Utilities: Patient Declined (06/22/2024)  Alcohol Screen: Low Risk  (01/12/2024)  Depression (PHQ2-9): High Risk (05/30/2024)  Financial Resource Strain: Low Risk  (01/12/2024)  Physical Activity: Inactive (01/12/2024)  Social Connections: Patient Declined (06/22/2024)  Stress: No Stress  Concern Present (01/12/2024)  Tobacco Use: High Risk (06/22/2024)  Health Literacy: Adequate Health Literacy (01/12/2024)   SDOH Interventions:     Readmission Risk Interventions     No data to display

## 2024-06-28 DIAGNOSIS — R251 Tremor, unspecified: Secondary | ICD-10-CM | POA: Diagnosis not present

## 2024-06-28 DIAGNOSIS — N179 Acute kidney failure, unspecified: Secondary | ICD-10-CM | POA: Diagnosis not present

## 2024-06-28 DIAGNOSIS — R296 Repeated falls: Secondary | ICD-10-CM | POA: Diagnosis not present

## 2024-06-28 DIAGNOSIS — G4730 Sleep apnea, unspecified: Secondary | ICD-10-CM | POA: Diagnosis not present

## 2024-06-28 DIAGNOSIS — J4520 Mild intermittent asthma, uncomplicated: Secondary | ICD-10-CM | POA: Diagnosis not present

## 2024-06-28 DIAGNOSIS — I10 Essential (primary) hypertension: Secondary | ICD-10-CM | POA: Diagnosis not present

## 2024-06-28 DIAGNOSIS — G43909 Migraine, unspecified, not intractable, without status migrainosus: Secondary | ICD-10-CM | POA: Diagnosis not present

## 2024-06-28 DIAGNOSIS — M6281 Muscle weakness (generalized): Secondary | ICD-10-CM | POA: Diagnosis not present

## 2024-06-28 DIAGNOSIS — R569 Unspecified convulsions: Secondary | ICD-10-CM | POA: Diagnosis not present

## 2024-06-28 DIAGNOSIS — E785 Hyperlipidemia, unspecified: Secondary | ICD-10-CM | POA: Diagnosis not present

## 2024-06-28 LAB — CBC
HCT: 35.3 % — ABNORMAL LOW (ref 36.0–46.0)
Hemoglobin: 11.4 g/dL — ABNORMAL LOW (ref 12.0–15.0)
MCH: 27.2 pg (ref 26.0–34.0)
MCHC: 32.3 g/dL (ref 30.0–36.0)
MCV: 84.2 fL (ref 80.0–100.0)
Platelets: 282 K/uL (ref 150–400)
RBC: 4.19 MIL/uL (ref 3.87–5.11)
RDW: 16.6 % — ABNORMAL HIGH (ref 11.5–15.5)
WBC: 10.4 K/uL (ref 4.0–10.5)
nRBC: 0 % (ref 0.0–0.2)

## 2024-06-28 LAB — BASIC METABOLIC PANEL WITH GFR
Anion gap: 11 (ref 5–15)
BUN: 25 mg/dL — ABNORMAL HIGH (ref 6–20)
CO2: 27 mmol/L (ref 22–32)
Calcium: 9.1 mg/dL (ref 8.9–10.3)
Chloride: 100 mmol/L (ref 98–111)
Creatinine, Ser: 1.01 mg/dL — ABNORMAL HIGH (ref 0.44–1.00)
GFR, Estimated: >60 mL/min (ref >60)
Glucose, Bld: 90 mg/dL (ref 70–99)
Potassium: 5 mmol/L (ref 3.5–5.1)
Sodium: 138 mmol/L (ref 135–145)

## 2024-06-28 MED ORDER — GABAPENTIN 300 MG PO CAPS: 900.0000 mg | ORAL_CAPSULE | Freq: Every day | ORAL | Status: DC

## 2024-06-28 MED ORDER — POLYETHYLENE GLYCOL 3350 17 G PO PACK: 17.0000 g | PACK | Freq: Every day | ORAL | Status: AC

## 2024-06-28 MED ORDER — GABAPENTIN 300 MG PO CAPS: 300.0000 mg | ORAL_CAPSULE | Freq: Every morning | ORAL | Status: DC

## 2024-06-28 NOTE — Discharge Summary (Signed)
 Kelly [name] FMW:[id number] DOB: [birth year] DOA: 06/21/2024  PCP: Wellington Curtis LABOR, FNP  Admit date: 06/21/2024 Discharge date: 06/28/2024  Time spent: 35 minutes  Recommendations for Outpatient Follow-up:  Pcp, neurology, and psychiatry f/u     Discharge Diagnoses:  Principal Problem:   Seizure [facility]) Active Problems:   Schizoaffective disorder (HCC)   Hypertension   Seizures (HCC)   AKI (acute kidney injury) (HCC)   Discharge Condition: stable  Diet recommendation: heart healthy  Filed Weights   06/21/24 2351  Weight: 97.5 kg    History of present illness:  From admission h and p   Kelly [name] is a 58 y.o. female with medical history significant of seizure, HTN, CAD MI, anxiety/depression, presented with seizure.   Patient lives by herself, with neighbors visit her on daily basis.  She has a seizure disorder but appears to be poorly controlled, as outpatient record showed that she had two episode of seizure in July of this year.  Patient insisted that she has been compliant with her seizure medications.  This time, patient started to have malaise, nausea 2 days ago, as a result, she has had decreased oral intake but she insists that she has been taking all her medications.  Yesterday evening, her neighbor went to see her and found the patient on the floor unresponsive, patient however did not remember what had happened before her neighbor arrived.  But she did have a lip bite mark but denied any urine or bowel movement incontinence.  Patient complained about left forearm pain, which she attributed to possible fall yesterday evening.  Denied any chest pain shortness of breath abdominal pain diarrhea fever or chills.  Hospital Course:   Seizure: etiology unclear, likely secondary to noncoherent with seizure medications. Continued on home dose of vimpat , lamictal , depakote  & gabapentin . Hospitalist team did not consult neurology. As there has been no seizure activity here on  home meds, discharge provider, who assumed care day of discharge, opted to continue this plan. Eeg negative. CT head shows no acute intracranial abnormalities. F/u with outpt neurologist dr. lane   Generalized weakness: PT/OT recs SNF.     Schizoaffective disorder: follows w/ psych outpt.   Pt refused to take seroquel , stating she didn't like the way it made her feel. Seroquel  was d/c as per pt's request. Other home meds continued   HTN: well controlled. continue on home dose of amlodipine , losartan , clonidine     Procedures: eeg (negative)  Consultations: none  Discharge Exam: Vitals:   06/28/24 0548 06/28/24 0926  BP: 117/61 (!) 141/75  Pulse: (!) 103 99  Resp: 16 19  Temp:  97.9 F (36.6 C)  SpO2: 100% 100%    General: NAD Cardiovascular: RRR Respiratory: CTAB  Discharge Instructions   Discharge Instructions     Diet - low sodium heart healthy   Complete by: As directed    Increase activity slowly   Complete by: As directed       Allergies as of 06/28/2024       Reactions   Percocet [oxycodone -acetaminophen ] Nausea And Vomiting        Medication List     STOP taking these medications    fluticasone  50 MCG/ACT nasal spray Commonly known as: FLONASE    QUEtiapine  25 MG tablet Commonly known as: SEROQUEL        TAKE these medications    amLODipine  5 MG tablet Commonly known as: NORVASC  Take 1 tablet (5 mg total) by mouth daily.   atorvastatin  40 MG  tablet Commonly known as: LIPITOR Take 1 tablet (40 mg total) by mouth at bedtime.   cloNIDine  0.1 MG tablet Commonly known as: CATAPRES  Take 1 tablet (0.1 mg total) by mouth daily.   divalproex  500 MG DR tablet Commonly known as: DEPAKOTE  Take 500 mg by mouth 2 (two) times daily.   gabapentin  300 MG capsule Commonly known as: NEURONTIN  Take 3 capsules (900 mg total) by mouth at bedtime. What changed: You were already taking a medication with the same name, and this prescription was added.  Make sure you understand how and when to take each.   gabapentin  300 MG capsule Commonly known as: NEURONTIN  Take 1 capsule (300 mg total) by mouth in the morning. Start taking on: June 29, 2024 What changed:  how much to take when to take this additional instructions   lamoTRIgine  200 MG tablet Commonly known as: LAMICTAL  Take 1 tablet (200 mg total) by mouth 2 (two) times daily.   losartan  25 MG tablet Commonly known as: COZAAR  Take 1 tablet (25 mg total) by mouth daily.   montelukast  10 MG tablet Commonly known as: SINGULAIR  Take 1 tablet (10 mg total) by mouth at bedtime.   nortriptyline  50 MG capsule Commonly known as: PAMELOR  Take 50 mg by mouth at bedtime.   omeprazole 40 MG capsule Commonly known as: PRILOSEC Take 40 mg by mouth daily.   polyethylene glycol 17 g packet Commonly known as: MIRALAX  / GLYCOLAX  Take 17 g by mouth daily. Start taking on: June 29, 2024   risperiDONE  2 MG tablet Commonly known as: RISPERDAL  Take 2 mg by mouth at bedtime.   Ventolin  HFA 108 (90 Base) MCG/ACT inhaler Generic drug: albuterol  Inhale 2 puffs into the lungs every 6 (six) hours as needed.   Vimpat  200 MG Tabs tablet Generic drug: lacosamide  Take 200 mg by mouth 2 (two) times daily.       Allergies  Allergen Reactions   Percocet [Oxycodone -Acetaminophen ] Nausea And Vomiting    Follow-up Information     Wellington Curtis LABOR, FNP Follow up.   Specialty: Family Medicine Why: hospital follow up Contact information: [address] [place] [address] [place] [address] [phone]         Lane Arthea BRAVO, MD Follow up.   Specialty: Neurology Contact information: [phone] [employer] [address] [facility] [place] [address] [phone]         Saucier, Dorn Ruth, NP Follow up.   Specialty: Psychiatry Contact information: [address] [place] [address] [phone]                  The results of  significant diagnostics from this hospitalization (including imaging, microbiology, ancillary and laboratory) are listed below for reference.    Significant Diagnostic Studies: EEG adult Result Date: 06/22/2024 Shelton Arlin KIDD, MD     06/22/2024 12:28 PM Patient Name: Kelly [name] MRN: [id number] Epilepsy Attending: Arlin KIDD Shelton Referring Physician/Provider: Laurita Cort DASEN, MD Date: 06/22/2024 Duration: 28.34 mins Patient history:  58 y.o. female with medical history significant of seizure, HTN, CAD MI, anxiety/depression, presented with seizure. EEG to evaluate for seizure Level of alertness: Awake AEDs during EEG study: GBP, VPA Technical aspects: This EEG study was done with scalp electrodes positioned according to the 10-20 International system of electrode placement. Electrical activity was reviewed with band pass filter of 1-70Hz , sensitivity of 7 uV/mm, display speed of 5mm/sec with a 60Hz  notched filter applied as appropriate. EEG data were recorded continuously and digitally  stored.  Video monitoring was available and reviewed as appropriate. Description: The posterior dominant rhythm consists of 8 Hz activity of moderate voltage (25-35 uV) seen predominantly in posterior head regions, symmetric and reactive to eye opening and eye closing. Hyperventilation and photic stimulation were not performed.   IMPRESSION: This study is within normal limits. No seizures or epileptiform discharges were seen throughout the recording. A normal interictal EEG does not exclude the diagnosis of epilepsy. Arlin MALVA Krebs   DG Foot 2 Views Left Result Date: 06/22/2024 CLINICAL DATA:  Fall EXAM: LEFT FOOT - 2 VIEW COMPARISON:  None Available. FINDINGS: No malalignment. Questionable lucency at the base of the third metatarsal. Degenerative changes at the first MTP joint. IMPRESSION: Questionable minimal lucency and deformity at the base of the third metatarsal, correlate for point tenderness Electronically Signed   By:  Luke Bun M.D.   On: 06/22/2024 01:47   DG Forearm Left Result Date: 06/22/2024 CLINICAL DATA:  Fall EXAM: LEFT FOREARM - 2 VIEW COMPARISON:  05/05/2012 FINDINGS: There is no evidence of fracture or other focal bone lesions. Soft tissues are unremarkable. IMPRESSION: Negative. Electronically Signed   By: Luke Bun M.D.   On: 06/22/2024 01:45   DG Chest Portable 1 View Result Date: 06/22/2024 CLINICAL DATA:  Fall, dyspnea EXAM: PORTABLE CHEST 1 VIEW COMPARISON:  03/09/2023 FINDINGS: Mild hypoventilatory changes. Atelectasis or scarring in the left lower lung. Cardiomediastinal silhouette within normal limits. Aortic atherosclerosis IMPRESSION: No active disease. Atelectasis or scarring in the left lower lung. Electronically Signed   By: Luke Bun M.D.   On: 06/22/2024 01:44   CT Head Wo Contrast Result Date: 06/22/2024 CLINICAL DATA:  Mental status change, unknown cause Neuro deficit, acute, stroke suspected seizure, L sided weakness, ams, found down, ? trauma; Neck trauma, intoxicated or obtunded (Age >= 16y) EXAM: CT HEAD WITHOUT CONTRAST CT CERVICAL SPINE WITHOUT CONTRAST TECHNIQUE: Multidetector CT imaging of the head and cervical spine was performed following the standard protocol without intravenous contrast. Multiplanar CT image reconstructions of the cervical spine were also generated. RADIATION DOSE REDUCTION: This exam was performed according to the departmental dose-optimization program which includes automated exposure control, adjustment of the mA and/or kV according to patient size and/or use of iterative reconstruction technique. COMPARISON:  CT head and CT cervical spine 11/26/2021. FINDINGS: Mildly motion limited study.  Within this limitation: CT HEAD FINDINGS Brain: No evidence of acute infarction, hemorrhage, hydrocephalus, extra-axial collection or mass lesion/mass effect. Vascular: No hyperdense vessel. Skull: No acute fracture. Sinuses/Orbits: No acute finding. CT CERVICAL  SPINE FINDINGS Alignment: Unchanged alignment. No substantial sagittal subluxation. Skull base and vertebrae: No evidence of acute fracture. Vertebral body heights are maintained. Soft tissues and spinal canal: No prevertebral fluid or swelling. No visible canal hematoma. Disc levels: Similar multilevel degenerative change. Upper chest: Lung apices are clear. IMPRESSION: 1. No evidence of acute intracranial abnormality. 2. No evidence of acute fracture or traumatic malalignment in the cervical spine. Electronically Signed   By: Gilmore GORMAN Molt M.D.   On: 06/22/2024 00:25   CT Cervical Spine Wo Contrast Result Date: 06/22/2024 CLINICAL DATA:  Mental status change, unknown cause Neuro deficit, acute, stroke suspected seizure, L sided weakness, ams, found down, ? trauma; Neck trauma, intoxicated or obtunded (Age >= 16y) EXAM: CT HEAD WITHOUT CONTRAST CT CERVICAL SPINE WITHOUT CONTRAST TECHNIQUE: Multidetector CT imaging of the head and cervical spine was performed following the standard protocol without intravenous contrast. Multiplanar CT image reconstructions of the cervical spine  were also generated. RADIATION DOSE REDUCTION: This exam was performed according to the departmental dose-optimization program which includes automated exposure control, adjustment of the mA and/or kV according to patient size and/or use of iterative reconstruction technique. COMPARISON:  CT head and CT cervical spine 11/26/2021. FINDINGS: Mildly motion limited study.  Within this limitation: CT HEAD FINDINGS Brain: No evidence of acute infarction, hemorrhage, hydrocephalus, extra-axial collection or mass lesion/mass effect. Vascular: No hyperdense vessel. Skull: No acute fracture. Sinuses/Orbits: No acute finding. CT CERVICAL SPINE FINDINGS Alignment: Unchanged alignment. No substantial sagittal subluxation. Skull base and vertebrae: No evidence of acute fracture. Vertebral body heights are maintained. Soft tissues and spinal canal: No  prevertebral fluid or swelling. No visible canal hematoma. Disc levels: Similar multilevel degenerative change. Upper chest: Lung apices are clear. IMPRESSION: 1. No evidence of acute intracranial abnormality. 2. No evidence of acute fracture or traumatic malalignment in the cervical spine. Electronically Signed   By: Gilmore GORMAN Molt M.D.   On: 06/22/2024 00:25    Microbiology: No results found for this or any previous visit (from the past 240 hours).   Labs: Basic Metabolic Panel: Recent Labs  Lab 06/21/24 2346 06/24/24 0324 06/25/24 0541 06/26/24 0432 06/27/24 0555 06/28/24 0258  NA 140 139 142 139 141 138  K 4.0 3.9 4.7 4.5 4.9 5.0  CL 101 106 104 105 102 100  CO2 25 25 28 25 28 27   GLUCOSE 95 106* 90 106* 86 90  BUN 20 29* 27* 26* 23* 25*  CREATININE 0.89 1.42* 0.98 0.91 0.86 1.01*  CALCIUM  9.6 8.6* 8.8* 9.3 9.4 9.1  MG 2.2  --   --   --   --   --    Liver Function Tests: Recent Labs  Lab 06/21/24 2346  AST 33  ALT 19  ALKPHOS 86  BILITOT 1.1  PROT 8.4*  ALBUMIN 4.1   No results for input(s): LIPASE, AMYLASE in the last 168 hours. No results for input(s): AMMONIA in the last 168 hours. CBC: Recent Labs  Lab 06/21/24 2346 06/23/24 0517 06/24/24 0324 06/25/24 0541 06/26/24 0432 06/27/24 0555 06/28/24 0258  WBC 14.6*   < > 10.2 10.0 9.6 9.6 10.4  NEUTROABS 11.0*  --   --   --   --   --   --   HGB 12.6   < > 10.5* 10.7* 11.8* 10.9* 11.4*  HCT 38.5   < > 32.8* 33.3* 37.1 35.0* 35.3*  MCV 83.7   < > 84.8 84.5 86.5 85.4 84.2  PLT 255   < > 237 246 253 269 282   < > = values in this interval not displayed.   Cardiac Enzymes: Recent Labs  Lab 06/21/24 2346  CKTOTAL 402*   BNP: BNP (last 3 results) No results for input(s): BNP in the last 8760 hours.  ProBNP (last 3 results) No results for input(s): PROBNP in the last 8760 hours.  CBG: No results for input(s): GLUCAP in the last 168 hours.     Signed:  Devaughn KATHEE Ban MD.  [facility] 06/28/2024, 2:52 PM

## 2024-06-28 NOTE — Progress Notes (Signed)
 Occupational Therapy Treatment Patient Details Name: Preslea Rhodus MRN: [id number] DOB: 02-Jul-1966 Today's Date: 06/28/2024   History of present illness Emmalena Canny is a 58 y.o. female with medical history significant of seizure, HTN, CAD MI, anxiety/depression, presented with seizure.   OT comments  Pt seen for OT treatment on this date. Upon arrival to room pt supine in bed, agreeable to tx however very agitated and somewhat confused regarding a conversation she reported recently having with the MD regarding discharging.  Chart reviewed with pt in order to support pt understanding of current discharge plan with education on role of therapy services in Acute vs SNF setting vs HH with pt agreeing that she felt ongoing services in SNF would be helpful as a next step.  Pt required increased time to process information and continued to perseverate throughout session regarding her discussion with MD and even with redirection to an ADL task continued to recount the conversation. Pt finally engaged in UB dressing task with SBA to Min A overall from bed level due to level of distractibility.  Pt is making progress toward goals, will continue to follow POC. Discharge recommendation remains appropriate.        If plan is discharge home, recommend the following:  A lot of help with walking and/or transfers;A lot of help with bathing/dressing/bathroom;Direct supervision/assist for medications management;Supervision due to cognitive status;Direct supervision/assist for financial management;Assist for transportation;Assistance with cooking/housework;Help with stairs or ramp for entrance   Equipment Recommendations       Recommendations for Other Services      Precautions / Restrictions Precautions Precautions: Fall Precaution/Restrictions Comments: hx of seizure disorder Restrictions Weight Bearing Restrictions Per Provider Order: No       Mobility Bed Mobility                     Transfers                         Balance                                           ADL either performed or assessed with clinical judgement   ADL Overall ADL's : Needs assistance/impaired                 Upper Body Dressing : Minimal assistance;Bed level Upper Body Dressing Details (indicate cue type and reason): Pt very distracted on this date, increased level of agitation related to discharge plan resulting in increased time to process requests and to remain focused on presented ADL tasks                        Extremity/Trunk Assessment Upper Extremity Assessment Upper Extremity Assessment: Generalized weakness            Vision       Perception     Praxis     Communication     Cognition Arousal: Alert Behavior During Therapy: WFL for tasks assessed/performed               OT - Cognition Comments: Impaired processing, limitations in safety awareness, very impulsive, perseverates                 Following commands: Impaired Following commands impaired: Follows one step commands inconsistently      Cueing  Exercises Other Exercises Other Exercises: Education on discharge plan as pt very distracted and confused regarding discharging along the continuum of care.    Shoulder Instructions       General Comments      Pertinent Vitals/ Pain       Pain Assessment Pain Assessment: No/denies pain  Home Living Family/patient expects to be discharged to:: Private residence                                        Prior Functioning/Environment              Frequency  Min 2X/week        Progress Toward Goals  OT Goals(current goals can now be found in the care plan section)  Progress towards OT goals: Progressing toward goals     Plan      Co-evaluation                 AM-PAC OT 6 Clicks Daily Activity     Outcome Measure                    End  of Session Equipment Utilized During Treatment: Gait belt;Rolling walker (2 wheels)  OT Visit Diagnosis: Other abnormalities of gait and mobility (R26.89);History of falling (Z91.81);Muscle weakness (generalized) (M62.81);Other symptoms and signs involving cognitive function   Activity Tolerance Patient limited by lethargy;Patient tolerated treatment well   Patient Left in bed;with call bell/phone within reach;with bed alarm set   Nurse Communication Mobility status        Time: 8652-8578 OT Time Calculation (min): 34 min  Charges: OT General Charges $OT Visit: 1 Visit OT Treatments $Therapeutic Activity: 23-37 mins  Harlene Sharps OTR/L   Harlene LITTIE Sharps 06/28/2024, 3:14 PM

## 2024-06-28 NOTE — TOC Transition Note (Signed)
 Transition of Care [facility]) - Discharge Note   Patient Details  Name: Kelly [name] MRN: [id number] Date of Birth: [birth year]  [facility]) CM/SW Contact:  Dalia GORMAN Fuse, RN Phone Number: 06/28/2024, 2:44 PM   Clinical Narrative:     Patient is medically clear to discharge to [facility]. Patient is agreeable with the discharge plan. [facility] will transport the patient.   Nurse to call report to [facility] [phone] room 1B.  TOC will continue to follow.  Final next level of care: Skilled Nursing Facility Barriers to Discharge: Barriers Resolved   Patient Goals and CMS Choice            Discharge Placement              Patient chooses bed at: [facility] Patient to be transferred to facility by: [facility] Name of family member notified: Patient Patient and family notified of of transfer: 06/28/24  Discharge Plan and Services Additional resources added to the After Visit Summary for                              [facility] Agency: [facility] Date [facility] Agency Contacted: 06/26/24 Time HH Agency Contacted: [phone] Representative spoke with at [facility] Agency: [place]  Social Drivers of Health (SDOH) Interventions SDOH Screenings   Food Insecurity: Patient Declined (06/22/2024)  Housing: Patient Declined (06/22/2024)  Transportation Needs: Patient Declined (06/22/2024)  Utilities: Patient Declined (06/22/2024)  Alcohol Screen: Low Risk  (01/12/2024)  Depression (PHQ2-9): High Risk (05/30/2024)  Financial Resource Strain: Low Risk  (01/12/2024)  Physical Activity: Inactive (01/12/2024)  Social Connections: Patient Declined (06/22/2024)  Stress: No Stress Concern Present (01/12/2024)  Tobacco Use: High Risk (06/22/2024)  Health Literacy: Adequate Health Literacy (01/12/2024)     Readmission Risk Interventions     No data to display

## 2024-06-28 NOTE — Progress Notes (Signed)
 Pt received in bed for co-tx with OT for safe mobility progression requiring two skilled therapists. Pt progressed with Bed mobility requiring only ModI and side rail to transition to EOB. Currently remains a high fall risk due to B LE weakness and impaired motor control causing knees to tremor and buckle in standing. ModA at times to prevent falls in standing with support of RW. Significant L lateral lean in sitting and standing. Pt currently not safe to return home. Initial recs for STR remain appropriate.   06/26/24 1353  PT Visit Information  Assistance Needed +2  Reason for Co-Treatment Complexity of the patient's impairments (multi-system involvement);Necessary to address cognition/behavior during functional activity;For patient/therapist safety  PT goals addressed during session Mobility/safety with mobility;Balance  OT goals addressed during session ADL's and self-care;Proper use of Adaptive equipment and DME  History of Present Illness Kelly [name] is a 58 y.o. female with medical history significant of seizure, HTN, CAD MI, anxiety/depression, presented with seizure.  Subjective Data  Subjective  (I can normally walk with 2 canes and prepare my meals at home)  Patient Stated Goal  (go to rehab)  Precautions  Precautions Fall  Precaution/Restrictions Comments hx of seizure disorder  Restrictions  Weight Bearing Restrictions Per Provider Order No  Pain Assessment  Pain Assessment No/denies pain  Cognition  Arousal Lethargic  Behavior During Therapy Impulsive  PT - Cognition Comments Lethargic, requires repeated cuing  Following Commands  Following commands Impaired  Following commands impaired Follows one step commands inconsistently  Cueing  Cueing Techniques Verbal cues;Gestural cues;Tactile cues;Visual cues  Communication  Communication Impaired  Factors Affecting Communication Difficulty expressing self;Reduced clarity of speech  Bed Mobility  Overal bed mobility  Modified Independent  General bed mobility comments  (HOB raised, use of rails)  Transfers  Overall transfer level Needs assistance  Equipment used Rolling walker (2 wheels)  Transfers Sit to/from Stand  Sit to Stand Min assist;+2 physical assistance;+2 safety/equipment  General transfer comment cuing required for safety with BUEs during ADL transfers, pt VERY impulsive with limited safety awareness  Ambulation/Gait  Ambulation/Gait assistance Mod assist  Gait Distance (Feet) 4 Feet  Assistive device Rolling walker (2 wheels)  Gait Pattern/deviations Step-to pattern;Knees buckling;Staggering left  General Gait Details Very high fall risk with use of RW due to tremorous B LE's, L>R and knee buckling  Gait velocity decreased  Balance  Overall balance assessment Needs assistance  Sitting-balance support Feet supported;Single extremity supported  Sitting balance-Leahy Scale Fair  Sitting balance - Comments  (Left lateral leaning)  Standing balance support Bilateral upper extremity supported;During functional activity;Reliant on assistive device for balance  Standing balance-Leahy Scale Poor  Standing balance comment  (High fall risk poor motor control causing B knees to buckle)  General Comments  General comments (skin integrity, edema, etc.) B LE's Grossly 4/5 strength/5  Other Exercises  Other Exercises Pt educataed on role of PT, benefits of STR, and discussion on home set up and available assistance  PT - End of Session  Equipment Utilized During Treatment Gait belt  Activity Tolerance Patient tolerated treatment well  Patient left in chair;with call bell/phone within reach;with chair alarm set  Nurse Communication Mobility status   PT - Assessment/Plan  PT Visit Diagnosis Muscle weakness (generalized) (M62.81);Other abnormalities of gait and mobility (R26.89)  PT Frequency (ACUTE ONLY) Min 3X/week  Follow Up Recommendations Skilled nursing-short term rehab (<3 hours/day)  Can  patient physically be transported by private vehicle No  Patient can return home with  the following Two people to help with walking and/or transfers;A lot of help with bathing/dressing/bathroom;Assistance with cooking/housework;Help with stairs or ramp for entrance  PT equipment Other (comment) (TBD, ? w/c)  AM-PAC PT 6 Clicks Mobility Outcome Measure (Version 2)  Help needed turning from your back to your side while in a flat bed without using bedrails? 3  Help needed moving from lying on your back to sitting on the side of a flat bed without using bedrails? 3  Help needed moving to and from a bed to a chair (including a wheelchair)? 2  Help needed standing up from a chair using your arms (e.g., wheelchair or bedside chair)? 2  Help needed to walk in hospital room? 1  Help needed climbing 3-5 steps with a railing?  1  6 Click Score 12  Consider Recommendation of Discharge To: CIR/SNF/LTACH  Progressive Mobility  What is the highest level of mobility based on the mobility assessment? Level 3 (Stands with assistance) - Balance while standing  and cannot march in place  Activity Stood at bedside  PT Goal Progression  Progress towards PT goals Progressing toward goals  PT Time Calculation  PT Start Time (ACUTE ONLY) 1308  PT Stop Time (ACUTE ONLY) 1338  PT Time Calculation (min) (ACUTE ONLY) 30 min  PT General Charges  $$ ACUTE PT VISIT 1 Visit  PT Treatments  $Therapeutic Activity 8-22 mins  Darice Bohr, PTA

## 2024-06-28 NOTE — Progress Notes (Signed)
 Report called to [employer]. AVS placed in discharge packet for transport

## 2024-06-29 DIAGNOSIS — J4520 Mild intermittent asthma, uncomplicated: Secondary | ICD-10-CM | POA: Diagnosis not present

## 2024-06-29 DIAGNOSIS — K5900 Constipation, unspecified: Secondary | ICD-10-CM | POA: Diagnosis not present

## 2024-06-29 DIAGNOSIS — I2510 Atherosclerotic heart disease of native coronary artery without angina pectoris: Secondary | ICD-10-CM | POA: Diagnosis not present

## 2024-06-29 DIAGNOSIS — I119 Hypertensive heart disease without heart failure: Secondary | ICD-10-CM | POA: Diagnosis not present

## 2024-06-29 DIAGNOSIS — Z720 Tobacco use: Secondary | ICD-10-CM | POA: Diagnosis not present

## 2024-06-29 DIAGNOSIS — E785 Hyperlipidemia, unspecified: Secondary | ICD-10-CM | POA: Diagnosis not present

## 2024-06-29 DIAGNOSIS — K219 Gastro-esophageal reflux disease without esophagitis: Secondary | ICD-10-CM | POA: Diagnosis not present

## 2024-06-30 ENCOUNTER — Telehealth: Payer: Self-pay

## 2024-06-30 DIAGNOSIS — I119 Hypertensive heart disease without heart failure: Secondary | ICD-10-CM | POA: Diagnosis not present

## 2024-06-30 DIAGNOSIS — I2510 Atherosclerotic heart disease of native coronary artery without angina pectoris: Secondary | ICD-10-CM | POA: Diagnosis not present

## 2024-06-30 NOTE — Progress Notes (Unsigned)
 Complex Care Management Note Care Guide Note  06/30/2024 Name: Kelly [name] MRN: [id number] DOB: 05-Jun-1966   Complex Care Management Outreach Attempts: An unsuccessful telephone outreach was attempted today to offer the patient information about available complex care management services.  Follow Up Plan:  Additional outreach attempts will be made to offer the patient complex care management information and services.   Encounter Outcome:  No Answer  Dreama [facility]  [facility], [place] [facility] Health Care Management Assistant Direct Dial: [phone]  Fax: [phone]

## 2024-07-03 DIAGNOSIS — I2510 Atherosclerotic heart disease of native coronary artery without angina pectoris: Secondary | ICD-10-CM | POA: Diagnosis not present

## 2024-07-03 DIAGNOSIS — E785 Hyperlipidemia, unspecified: Secondary | ICD-10-CM | POA: Diagnosis not present

## 2024-07-03 DIAGNOSIS — I119 Hypertensive heart disease without heart failure: Secondary | ICD-10-CM | POA: Diagnosis not present

## 2024-07-03 DIAGNOSIS — G43909 Migraine, unspecified, not intractable, without status migrainosus: Secondary | ICD-10-CM | POA: Diagnosis not present

## 2024-07-03 DIAGNOSIS — J4520 Mild intermittent asthma, uncomplicated: Secondary | ICD-10-CM | POA: Diagnosis not present

## 2024-07-04 NOTE — Progress Notes (Signed)
 Complex Care Management Note Care Guide Note  07/04/2024 Name: Kelly [name] MRN: [id number] DOB: 11-10-1966   Complex Care Management Outreach Attempts: A second unsuccessful outreach was attempted today to offer the patient with information about available complex care management services.  Follow Up Plan:  No further outreach attempts will be made at this time. We have been unable to contact the patient to offer or enroll patient in complex care management services.  Encounter Outcome:  No Answer  Dreama [facility]  [facility], [facility] Assistant Direct Dial: [phone]  Fax: [phone]

## 2024-07-07 DIAGNOSIS — Z91148 Patient's other noncompliance with medication regimen for other reason: Secondary | ICD-10-CM | POA: Diagnosis not present

## 2024-07-07 DIAGNOSIS — I119 Hypertensive heart disease without heart failure: Secondary | ICD-10-CM | POA: Diagnosis not present

## 2024-07-10 ENCOUNTER — Other Ambulatory Visit: Payer: Self-pay

## 2024-07-10 ENCOUNTER — Emergency Department

## 2024-07-10 ENCOUNTER — Emergency Department
Admission: EM | Admit: 2024-07-10 | Discharge: 2024-07-11 | Disposition: A | Discharge status: Home / Self Care | Attending: Emergency Medicine | Admitting: Emergency Medicine

## 2024-07-10 DIAGNOSIS — R0781 Pleurodynia: Secondary | ICD-10-CM | POA: Diagnosis not present

## 2024-07-10 DIAGNOSIS — R404 Transient alteration of awareness: Secondary | ICD-10-CM | POA: Diagnosis not present

## 2024-07-10 DIAGNOSIS — I10 Essential (primary) hypertension: Secondary | ICD-10-CM | POA: Diagnosis not present

## 2024-07-10 DIAGNOSIS — S0993XA Unspecified injury of face, initial encounter: Secondary | ICD-10-CM | POA: Diagnosis present

## 2024-07-10 DIAGNOSIS — R296 Repeated falls: Secondary | ICD-10-CM | POA: Diagnosis not present

## 2024-07-10 DIAGNOSIS — R918 Other nonspecific abnormal finding of lung field: Secondary | ICD-10-CM | POA: Diagnosis not present

## 2024-07-10 DIAGNOSIS — S0083XA Contusion of other part of head, initial encounter: Secondary | ICD-10-CM | POA: Insufficient documentation

## 2024-07-10 DIAGNOSIS — R079 Chest pain, unspecified: Secondary | ICD-10-CM

## 2024-07-10 DIAGNOSIS — M4802 Spinal stenosis, cervical region: Secondary | ICD-10-CM | POA: Diagnosis not present

## 2024-07-10 DIAGNOSIS — R569 Unspecified convulsions: Secondary | ICD-10-CM | POA: Diagnosis not present

## 2024-07-10 DIAGNOSIS — R519 Headache, unspecified: Secondary | ICD-10-CM | POA: Diagnosis not present

## 2024-07-10 DIAGNOSIS — R0789 Other chest pain: Secondary | ICD-10-CM | POA: Diagnosis not present

## 2024-07-10 DIAGNOSIS — R600 Localized edema: Secondary | ICD-10-CM | POA: Diagnosis not present

## 2024-07-10 DIAGNOSIS — S0121XA Laceration without foreign body of nose, initial encounter: Secondary | ICD-10-CM | POA: Diagnosis not present

## 2024-07-10 DIAGNOSIS — S20219A Contusion of unspecified front wall of thorax, initial encounter: Secondary | ICD-10-CM | POA: Diagnosis not present

## 2024-07-10 DIAGNOSIS — W19XXXA Unspecified fall, initial encounter: Secondary | ICD-10-CM | POA: Diagnosis not present

## 2024-07-10 LAB — COMPREHENSIVE METABOLIC PANEL WITH GFR
ALT: 16 U/L (ref 0–44)
AST: 20 U/L (ref 15–41)
Albumin: 3.3 g/dL — ABNORMAL LOW (ref 3.5–5.0)
Alkaline Phosphatase: 68 U/L (ref 38–126)
Anion gap: 11 (ref 5–15)
BUN: 17 mg/dL (ref 6–20)
CO2: 25 mmol/L (ref 22–32)
Calcium: 9.1 mg/dL (ref 8.9–10.3)
Chloride: 104 mmol/L (ref 98–111)
Creatinine, Ser: 0.96 mg/dL (ref 0.44–1.00)
GFR, Estimated: >60 mL/min (ref >60)
Glucose, Bld: 87 mg/dL (ref 70–99)
Potassium: 4.1 mmol/L (ref 3.5–5.1)
Sodium: 140 mmol/L (ref 135–145)
Total Bilirubin: 0.5 mg/dL (ref 0.0–1.2)
Total Protein: 7 g/dL (ref 6.5–8.1)

## 2024-07-10 LAB — CBC WITH DIFFERENTIAL/PLATELET
Abs Immature Granulocytes: 0.03 K/uL (ref 0.00–0.07)
Basophils Absolute: 0 K/uL (ref 0.0–0.1)
Basophils Relative: 0 %
Eosinophils Absolute: 0.2 K/uL (ref 0.0–0.5)
Eosinophils Relative: 1 %
HCT: 39.1 % (ref 36.0–46.0)
Hemoglobin: 12 g/dL (ref 12.0–15.0)
Immature Granulocytes: 0 %
Lymphocytes Relative: 25 %
Lymphs Abs: 2.9 K/uL (ref 0.7–4.0)
MCH: 27 pg (ref 26.0–34.0)
MCHC: 30.7 g/dL (ref 30.0–36.0)
MCV: 88.1 fL (ref 80.0–100.0)
Monocytes Absolute: 0.7 K/uL (ref 0.1–1.0)
Monocytes Relative: 6 %
Neutro Abs: 7.9 K/uL — ABNORMAL HIGH (ref 1.7–7.7)
Neutrophils Relative %: 68 %
Platelets: 250 K/uL (ref 150–400)
RBC: 4.44 MIL/uL (ref 3.87–5.11)
RDW: 17.2 % — ABNORMAL HIGH (ref 11.5–15.5)
WBC: 11.7 K/uL — ABNORMAL HIGH (ref 4.0–10.5)
nRBC: 0 % (ref 0.0–0.2)

## 2024-07-10 LAB — TROPONIN I (HIGH SENSITIVITY)
Troponin I (High Sensitivity): 5 ng/L (ref <18)
Troponin I (High Sensitivity): 7 ng/L (ref <18)

## 2024-07-10 MED ORDER — KETOROLAC TROMETHAMINE 30 MG/ML IJ SOLN
15.0000 mg | Freq: Once | INTRAMUSCULAR | Status: AC
  Administered 2024-07-10: 15 mg via INTRAVENOUS
  Filled 2024-07-10: qty 1

## 2024-07-10 NOTE — ED Notes (Signed)
 Attempted report back to [employer] without answer at this time.

## 2024-07-10 NOTE — ED Notes (Signed)
 Called [facility] @2011  to arrange for patient to go back to [facility]. Spoke to [employer] who took needed information for approval will notify team. Unable to give time for pick up

## 2024-07-10 NOTE — ED Provider Notes (Signed)
 Patient received in signout from Dr. Waymond pending reevaluations and repeat troponin after a witnessed seizure.  She has no further seizures here in the ED, negative troponins.  I reassessed the patient and she reports feeling well and asking to go back to her facility.  Does not think she can provide a urine sample, denies any urinary symptoms and reports wanting to go home.  We will discharge back to her facility.   Claudene Rover, MD 07/10/24 [phone]

## 2024-07-10 NOTE — ED Provider Notes (Signed)
 [facility] Provider Note    Event Date/Time   First MD Initiated Contact with Patient 07/10/24 1336     (approximate)   History   Fall   HPI  Kelly [name] is a 58 y.o. female with history of schizophrenia, seizures, anxiety, depression, hypertension, hyperlipidemia, presenting with a fall.  Patient states that she had a seizure.  Is also right sided rib pain.  States that she has been compliant with her medications since her facility gives into her and she takes them.  She denies any shortness of breath, nausea vomiting diarrhea, urinary symptoms, new weakness or numbness.  Per independent history from EMS, patient likely had an unwitnessed fall in her facility when she had a seizure.  The seizure in the fall were not witnessed.  Facility noted her right cheek was swollen with some blood on her right nose and inside her mouth.     On independent chart review, patient is on Depakote , Lamictal , Vimpat  for her seizures.  She was admitted in mid August for seizure.  At the time she was living by self with neighbors visiting on a daily basis, has history of seizures that are poorly controlled.  She was discharged to SNF.  Physical Exam   Triage Vital Signs: ED Triage Vitals  Encounter Vitals Group     BP      Girls Systolic BP Percentile      Girls Diastolic BP Percentile      Boys Systolic BP Percentile      Boys Diastolic BP Percentile      Pulse      Resp      Temp      Temp src      SpO2      Weight      Height      Head Circumference      Peak Flow      Pain Score      Pain Loc      Pain Education      Exclude from Growth Chart     Most recent vital signs: Vitals:   07/10/24 1500 07/10/24 1530  BP: 127/70 126/77  Pulse: 87 84  Resp: (!) 21 17  Temp:    SpO2: 99% 99%     General: Awake, no distress.  CV:  Good peripheral perfusion.  Resp:  Normal effort.  No respiratory distress Abd:  No distention.  Soft  nontender Other:  Mild swelling to her right cheek with tenderness, dried blood over her right lateral nose, right lateral tongue with healing laceration and trace blood, no tenderness to her mandible, teeth.  No trismus.  No other palpable skull deformities or tenderness, she has mild tenderness to the right anterior thoracic cage, no tenderness to all 4 extremities, equal radial pulses, no sensory deficits, no focal weakness, pupils are equal.   ED Results / Procedures / Treatments   Labs (all labs ordered are listed, but only abnormal results are displayed) Labs Reviewed  CBC WITH DIFFERENTIAL/PLATELET - Abnormal; Notable for the following components:      Result Value   WBC 11.7 (*)    RDW 17.2 (*)    Neutro Abs 7.9 (*)    All other components within normal limits  COMPREHENSIVE METABOLIC PANEL WITH GFR - Abnormal; Notable for the following components:   Albumin 3.3 (*)    All other components within normal limits  URINALYSIS, W/ REFLEX TO CULTURE (INFECTION SUSPECTED)  TROPONIN I (HIGH SENSITIVITY)  TROPONIN I (HIGH SENSITIVITY)     EKG  EKG shows, sinus rhythm, rate 90, normal QS, normal QTc, no obvious ischemic ST elevation, T wave flattening in aVL, not significant compared prior   RADIOLOGY On my independent interpretation, CT head without obvious intracranial hemorrhage   PROCEDURES:  Critical Care performed: No  Procedures   MEDICATIONS ORDERED IN ED: Medications - No data to display   IMPRESSION / MDM / ASSESSMENT AND PLAN / ED COURSE  I reviewed the triage vital signs and the nursing notes.                              Differential diagnosis includes, but is not limited to, breakthrough seizures, electrolyte derangements, occult infection, patient states that she has been compliant with her medications, the facility sent a med list that was printed out 3 days ago, all medication check boxes up to 3 days ago have been checked as meds given.  Does not  appear that she had missed any doses previously.  For the fall, considered contusion, strain, sprain, fracture, considered intracranial hemorrhage but patient is not on any anticoagulation, is not altered, for her rib pain, considered contusion, rib fractures, angina, atypical ACS, she denies any cough or fever to suggest viral illness or pneumonia.  Will get labs, EKG, troponin, chest x-ray, CT head, face, cervical spine, UA.  Patient's presentation is most consistent with acute presentation with potential threat to life or bodily function.  Independent interpretation of labs and imaging below.  Patient signed out pending repeat troponin as well as UA.  Otherwise had no repeated seizure-like activity here.  If workup is negative able to be discharged back to SNF.  The patient is on the cardiac monitor to evaluate for evidence of arrhythmia and/or significant heart rate changes.   Clinical Course as of 07/10/24 1542  Mon Jul 10, 2024  1448 DG Chest 2 View No active cardiopulmonary disease.  [TT]  1459 CT Maxillofacial WO CM IMPRESSION: 1. Moderate severity right-sided facial soft tissue swelling with an associated 1.4 cm x 1.1 cm x 2.0 cm hematoma. 2. No acute facial bone fracture.   [TT]  1500 CT Cervical Spine Wo Contrast IMPRESSION: 1. No acute fracture or subluxation in the cervical spine. 2. Mild to moderate severity degenerative changes at the levels of C4-C5 and C5-C6.   [TT]  1500 CT Head Wo Contrast No acute intracranial pathology.  [TT]  1500 DG Chest 2 View No active cardiopulmonary disease.  [TT]  1511 Independent review of labs, mild leukocytosis, troponin is not elevated. [TT]    Clinical Course User Index [TT] Waymond Lorelle Cummins, MD     FINAL CLINICAL IMPRESSION(S) / ED DIAGNOSES   Final diagnoses:  Fall, initial encounter  Contusion of face, initial encounter  Seizure (HCC)  Chest pain, unspecified type     Rx / DC Orders   ED Discharge Orders     None         Note:  This document was prepared using Dragon voice recognition software and may include unintentional dictation errors.    Waymond Lorelle Cummins, MD 07/10/24 [phone]

## 2024-07-10 NOTE — ED Triage Notes (Signed)
 Pt to er room number 10 via ems, per ems pt is here from [facility], states that pt may have fallen, may have had a seizure, pt awake, pt answering questions with eyes closed pt oriented to person, re oriented pt, pt has some dried blood on her nose and lips.  Pt states that she had a seizure and fell.

## 2024-07-10 NOTE — ED Notes (Signed)
 Pt given meal tray and drink.

## 2024-07-11 DIAGNOSIS — R296 Repeated falls: Secondary | ICD-10-CM | POA: Diagnosis not present

## 2024-07-11 DIAGNOSIS — Z7401 Bed confinement status: Secondary | ICD-10-CM | POA: Diagnosis not present

## 2024-07-11 DIAGNOSIS — R600 Localized edema: Secondary | ICD-10-CM | POA: Diagnosis not present

## 2024-07-11 DIAGNOSIS — S0121XA Laceration without foreign body of nose, initial encounter: Secondary | ICD-10-CM | POA: Diagnosis not present

## 2024-07-11 DIAGNOSIS — Z743 Need for continuous supervision: Secondary | ICD-10-CM | POA: Diagnosis not present

## 2024-07-11 DIAGNOSIS — M25519 Pain in unspecified shoulder: Secondary | ICD-10-CM | POA: Diagnosis not present

## 2024-07-11 NOTE — ED Notes (Signed)
 Transport arrived to take pt back to [facility]. Report called to facility by primary RN. Pt in NAD at time of departure.

## 2024-07-11 NOTE — ED Notes (Addendum)
 Report given to [place], Charity fundraiser at [employer]

## 2024-07-13 DIAGNOSIS — M25519 Pain in unspecified shoulder: Secondary | ICD-10-CM | POA: Diagnosis not present

## 2024-07-14 DIAGNOSIS — I119 Hypertensive heart disease without heart failure: Secondary | ICD-10-CM | POA: Diagnosis not present

## 2024-07-14 DIAGNOSIS — K219 Gastro-esophageal reflux disease without esophagitis: Secondary | ICD-10-CM | POA: Diagnosis not present

## 2024-07-14 DIAGNOSIS — I2510 Atherosclerotic heart disease of native coronary artery without angina pectoris: Secondary | ICD-10-CM | POA: Diagnosis not present

## 2024-07-14 DIAGNOSIS — E785 Hyperlipidemia, unspecified: Secondary | ICD-10-CM | POA: Diagnosis not present

## 2024-07-14 DIAGNOSIS — J4520 Mild intermittent asthma, uncomplicated: Secondary | ICD-10-CM | POA: Diagnosis not present

## 2024-07-14 DIAGNOSIS — K5900 Constipation, unspecified: Secondary | ICD-10-CM | POA: Diagnosis not present

## 2024-07-14 DIAGNOSIS — Z91148 Patient's other noncompliance with medication regimen for other reason: Secondary | ICD-10-CM | POA: Diagnosis not present

## 2024-07-14 DIAGNOSIS — Z720 Tobacco use: Secondary | ICD-10-CM | POA: Diagnosis not present

## 2024-07-19 ENCOUNTER — Telehealth: Payer: Self-pay

## 2024-07-19 NOTE — Transitions of Care (Post Inpatient/ED Visit) (Unsigned)
   07/19/2024  Name: Kimber Fritts MRN: [id number] DOB: Aug 26, 1966  Today's TOC FU Call Status: Today's TOC FU Call Status:: Unsuccessful Call (1st Attempt) Unsuccessful Call (1st Attempt) Date: 07/19/24  Attempted to reach the patient regarding the most recent Inpatient/ED visit.  Follow Up Plan: Additional outreach attempts will be made to reach the patient to complete the Transitions of Care (Post Inpatient/ED visit) call.   Signature Julian Lemmings, LPN [facility] Nurse Health Advisor Direct Dial [phone]

## 2024-07-20 ENCOUNTER — Other Ambulatory Visit: Payer: Self-pay

## 2024-07-20 ENCOUNTER — Inpatient Hospital Stay
Admission: EM | Admit: 2024-07-20 | Discharge: 2024-07-27 | DRG: 101 | Disposition: A | Discharge status: Home Health | Attending: Internal Medicine | Admitting: Internal Medicine

## 2024-07-20 ENCOUNTER — Observation Stay

## 2024-07-20 ENCOUNTER — Ambulatory Visit: Payer: Self-pay

## 2024-07-20 DIAGNOSIS — F419 Anxiety disorder, unspecified: Secondary | ICD-10-CM | POA: Diagnosis present

## 2024-07-20 DIAGNOSIS — W19XXXA Unspecified fall, initial encounter: Secondary | ICD-10-CM | POA: Diagnosis present

## 2024-07-20 DIAGNOSIS — Z885 Allergy status to narcotic agent status: Secondary | ICD-10-CM | POA: Diagnosis not present

## 2024-07-20 DIAGNOSIS — Z803 Family history of malignant neoplasm of breast: Secondary | ICD-10-CM | POA: Diagnosis not present

## 2024-07-20 DIAGNOSIS — F259 Schizoaffective disorder, unspecified: Secondary | ICD-10-CM | POA: Diagnosis present

## 2024-07-20 DIAGNOSIS — G4730 Sleep apnea, unspecified: Secondary | ICD-10-CM | POA: Diagnosis present

## 2024-07-20 DIAGNOSIS — E7800 Pure hypercholesterolemia, unspecified: Secondary | ICD-10-CM | POA: Diagnosis present

## 2024-07-20 DIAGNOSIS — D649 Anemia, unspecified: Secondary | ICD-10-CM | POA: Diagnosis present

## 2024-07-20 DIAGNOSIS — I10 Essential (primary) hypertension: Secondary | ICD-10-CM | POA: Diagnosis present

## 2024-07-20 DIAGNOSIS — E66811 Obesity, class 1: Secondary | ICD-10-CM | POA: Diagnosis present

## 2024-07-20 DIAGNOSIS — F32A Depression, unspecified: Secondary | ICD-10-CM | POA: Diagnosis present

## 2024-07-20 DIAGNOSIS — R296 Repeated falls: Secondary | ICD-10-CM

## 2024-07-20 DIAGNOSIS — Z91148 Patient's other noncompliance with medication regimen for other reason: Secondary | ICD-10-CM

## 2024-07-20 DIAGNOSIS — I517 Cardiomegaly: Secondary | ICD-10-CM | POA: Diagnosis not present

## 2024-07-20 DIAGNOSIS — G40909 Epilepsy, unspecified, not intractable, without status epilepticus: Principal | ICD-10-CM | POA: Diagnosis present

## 2024-07-20 DIAGNOSIS — E875 Hyperkalemia: Secondary | ICD-10-CM | POA: Diagnosis present

## 2024-07-20 DIAGNOSIS — R404 Transient alteration of awareness: Secondary | ICD-10-CM | POA: Diagnosis not present

## 2024-07-20 DIAGNOSIS — R569 Unspecified convulsions: Principal | ICD-10-CM

## 2024-07-20 DIAGNOSIS — S0083XA Contusion of other part of head, initial encounter: Secondary | ICD-10-CM | POA: Diagnosis present

## 2024-07-20 DIAGNOSIS — Z79899 Other long term (current) drug therapy: Secondary | ICD-10-CM

## 2024-07-20 DIAGNOSIS — N179 Acute kidney failure, unspecified: Secondary | ICD-10-CM | POA: Diagnosis present

## 2024-07-20 DIAGNOSIS — Y92009 Unspecified place in unspecified non-institutional (private) residence as the place of occurrence of the external cause: Secondary | ICD-10-CM | POA: Diagnosis not present

## 2024-07-20 DIAGNOSIS — I2510 Atherosclerotic heart disease of native coronary artery without angina pectoris: Secondary | ICD-10-CM | POA: Diagnosis present

## 2024-07-20 DIAGNOSIS — K219 Gastro-esophageal reflux disease without esophagitis: Secondary | ICD-10-CM | POA: Diagnosis present

## 2024-07-20 DIAGNOSIS — Z6834 Body mass index (BMI) 34.0-34.9, adult: Secondary | ICD-10-CM

## 2024-07-20 DIAGNOSIS — Z9151 Personal history of suicidal behavior: Secondary | ICD-10-CM | POA: Diagnosis not present

## 2024-07-20 DIAGNOSIS — Z8261 Family history of arthritis: Secondary | ICD-10-CM

## 2024-07-20 DIAGNOSIS — Z833 Family history of diabetes mellitus: Secondary | ICD-10-CM

## 2024-07-20 DIAGNOSIS — I252 Old myocardial infarction: Secondary | ICD-10-CM

## 2024-07-20 DIAGNOSIS — S0093XA Contusion of unspecified part of head, initial encounter: Secondary | ICD-10-CM | POA: Diagnosis not present

## 2024-07-20 DIAGNOSIS — R0989 Other specified symptoms and signs involving the circulatory and respiratory systems: Secondary | ICD-10-CM | POA: Diagnosis not present

## 2024-07-20 DIAGNOSIS — F17210 Nicotine dependence, cigarettes, uncomplicated: Secondary | ICD-10-CM | POA: Diagnosis present

## 2024-07-20 DIAGNOSIS — R220 Localized swelling, mass and lump, head: Secondary | ICD-10-CM | POA: Diagnosis not present

## 2024-07-20 LAB — CBC WITH DIFFERENTIAL/PLATELET
Abs Immature Granulocytes: 0.05 K/uL (ref 0.00–0.07)
Basophils Absolute: 0 K/uL (ref 0.0–0.1)
Basophils Relative: 0 %
Eosinophils Absolute: 0.2 K/uL (ref 0.0–0.5)
Eosinophils Relative: 2 %
HCT: 27.4 % — ABNORMAL LOW (ref 36.0–46.0)
Hemoglobin: 8.8 g/dL — ABNORMAL LOW (ref 12.0–15.0)
Immature Granulocytes: 0 %
Lymphocytes Relative: 43 %
Lymphs Abs: 5.3 K/uL — ABNORMAL HIGH (ref 0.7–4.0)
MCH: 27.8 pg (ref 26.0–34.0)
MCHC: 32.1 g/dL (ref 30.0–36.0)
MCV: 86.4 fL (ref 80.0–100.0)
Monocytes Absolute: 0.8 K/uL (ref 0.1–1.0)
Monocytes Relative: 6 %
Neutro Abs: 5.9 K/uL (ref 1.7–7.7)
Neutrophils Relative %: 49 %
Platelets: 253 K/uL (ref 150–400)
RBC: 3.17 MIL/uL — ABNORMAL LOW (ref 3.87–5.11)
RDW: 17.2 % — ABNORMAL HIGH (ref 11.5–15.5)
Smear Review: NORMAL
WBC: 12.2 K/uL — ABNORMAL HIGH (ref 4.0–10.5)
nRBC: 0 % (ref 0.0–0.2)

## 2024-07-20 LAB — COMPREHENSIVE METABOLIC PANEL WITH GFR
ALT: 12 U/L (ref 0–44)
AST: 14 U/L — ABNORMAL LOW (ref 15–41)
Albumin: 3.1 g/dL — ABNORMAL LOW (ref 3.5–5.0)
Alkaline Phosphatase: 60 U/L (ref 38–126)
Anion gap: 7 (ref 5–15)
BUN: 21 mg/dL — ABNORMAL HIGH (ref 6–20)
CO2: 27 mmol/L (ref 22–32)
Calcium: 8.5 mg/dL — ABNORMAL LOW (ref 8.9–10.3)
Chloride: 106 mmol/L (ref 98–111)
Creatinine, Ser: 0.7 mg/dL (ref 0.44–1.00)
GFR, Estimated: >60 mL/min (ref >60)
Glucose, Bld: 81 mg/dL (ref 70–99)
Potassium: 3.8 mmol/L (ref 3.5–5.1)
Sodium: 140 mmol/L (ref 135–145)
Total Bilirubin: 0.6 mg/dL (ref 0.0–1.2)
Total Protein: 6.5 g/dL (ref 6.5–8.1)

## 2024-07-20 LAB — VALPROIC ACID LEVEL: Valproic Acid Lvl: 64 ug/mL (ref 50–100)

## 2024-07-20 MED ORDER — VALPROATE SODIUM 100 MG/ML IV SOLN
500.0000 mg | Freq: Two times a day (BID) | INTRAVENOUS | Status: DC
  Administered 2024-07-20: 500 mg via INTRAVENOUS
  Filled 2024-07-20 (×2): qty 5

## 2024-07-20 MED ORDER — ENOXAPARIN SODIUM 40 MG/0.4ML IJ SOSY: 40.0000 mg | PREFILLED_SYRINGE | INTRAMUSCULAR | Status: DC

## 2024-07-20 MED ORDER — MIDAZOLAM HCL (PF) 5 MG/ML IJ SOLN
10.0000 mg | INTRAMUSCULAR | Status: DC | PRN
  Filled 2024-07-20: qty 2

## 2024-07-20 MED ORDER — PANTOPRAZOLE SODIUM 40 MG PO TBEC
40.0000 mg | DELAYED_RELEASE_TABLET | Freq: Every day | ORAL | Status: DC
  Administered 2024-07-21 – 2024-07-27 (×7): 40 mg via ORAL
  Filled 2024-07-20 (×7): qty 1

## 2024-07-20 MED ORDER — SODIUM CHLORIDE 0.9 % IV SOLN
200.0000 mg | Freq: Two times a day (BID) | INTRAVENOUS | Status: DC
  Administered 2024-07-20: 200 mg via INTRAVENOUS
  Filled 2024-07-20 (×2): qty 20

## 2024-07-20 MED ORDER — ALBUTEROL SULFATE HFA 108 (90 BASE) MCG/ACT IN AERS: 2.0000 | INHALATION_SPRAY | Freq: Four times a day (QID) | RESPIRATORY_TRACT | Status: DC | PRN

## 2024-07-20 MED ORDER — GABAPENTIN 300 MG PO CAPS
300.0000 mg | ORAL_CAPSULE | Freq: Every morning | ORAL | Status: DC
  Administered 2024-07-21: 300 mg via ORAL
  Filled 2024-07-20: qty 1

## 2024-07-20 MED ORDER — SODIUM CHLORIDE 0.9 % IV SOLN
75.0000 mL/h | INTRAVENOUS | Status: AC
  Administered 2024-07-20: 75 mL/h via INTRAVENOUS

## 2024-07-20 MED ORDER — LACOSAMIDE 50 MG PO TABS: 200.0000 mg | ORAL_TABLET | Freq: Two times a day (BID) | ORAL | Status: DC

## 2024-07-20 MED ORDER — TRAMADOL HCL 50 MG PO TABS
50.0000 mg | ORAL_TABLET | Freq: Four times a day (QID) | ORAL | Status: DC | PRN
  Administered 2024-07-21: 50 mg via ORAL
  Filled 2024-07-20: qty 1

## 2024-07-20 MED ORDER — ATORVASTATIN CALCIUM 20 MG PO TABS
40.0000 mg | ORAL_TABLET | Freq: Every day | ORAL | Status: DC
  Administered 2024-07-21 – 2024-07-26 (×6): 40 mg via ORAL
  Filled 2024-07-20 (×6): qty 2

## 2024-07-20 MED ORDER — LORAZEPAM 1 MG PO TABS
1.0000 mg | ORAL_TABLET | Freq: Once | ORAL | Status: AC
  Administered 2024-07-20: 1 mg via ORAL
  Filled 2024-07-20: qty 1

## 2024-07-20 MED ORDER — ACETAMINOPHEN 325 MG PO TABS
650.0000 mg | ORAL_TABLET | Freq: Four times a day (QID) | ORAL | Status: DC | PRN
  Administered 2024-07-22 – 2024-07-23 (×2): 650 mg via ORAL
  Filled 2024-07-20 (×2): qty 2

## 2024-07-20 MED ORDER — RISPERIDONE 1 MG PO TABS
2.0000 mg | ORAL_TABLET | Freq: Every day | ORAL | Status: DC
  Administered 2024-07-21 – 2024-07-26 (×6): 2 mg via ORAL
  Filled 2024-07-20 (×7): qty 2

## 2024-07-20 MED ORDER — HYDRALAZINE HCL 20 MG/ML IJ SOLN: 5.0000 mg | Freq: Four times a day (QID) | INTRAMUSCULAR | Status: DC | PRN

## 2024-07-20 MED ORDER — LAMOTRIGINE 100 MG PO TABS
200.0000 mg | ORAL_TABLET | Freq: Two times a day (BID) | ORAL | Status: DC
  Administered 2024-07-21 – 2024-07-27 (×14): 200 mg via ORAL
  Filled 2024-07-20 (×14): qty 2

## 2024-07-20 MED ORDER — MONTELUKAST SODIUM 10 MG PO TABS
10.0000 mg | ORAL_TABLET | Freq: Every day | ORAL | Status: DC
  Administered 2024-07-21 – 2024-07-26 (×7): 10 mg via ORAL
  Filled 2024-07-20 (×7): qty 1

## 2024-07-20 MED ORDER — LEVETIRACETAM (KEPPRA) 500 MG/5 ML ADULT IV PUSH
500.0000 mg | Freq: Once | INTRAVENOUS | Status: AC
  Administered 2024-07-20: 500 mg via INTRAVENOUS
  Filled 2024-07-20: qty 5

## 2024-07-20 MED ORDER — GABAPENTIN 300 MG PO CAPS
900.0000 mg | ORAL_CAPSULE | Freq: Every day | ORAL | Status: DC
Start: 2024-07-20 — End: 2024-07-27
  Administered 2024-07-21 – 2024-07-26 (×7): 900 mg via ORAL
  Filled 2024-07-20 (×5): qty 3
  Filled 2024-07-20: qty 9
  Filled 2024-07-20: qty 3

## 2024-07-20 MED ORDER — NORTRIPTYLINE HCL 25 MG PO CAPS
50.0000 mg | ORAL_CAPSULE | Freq: Every day | ORAL | Status: DC
  Administered 2024-07-21 – 2024-07-26 (×7): 50 mg via ORAL
  Filled 2024-07-20 (×9): qty 2

## 2024-07-20 MED ORDER — ORAL CARE MOUTH RINSE
15.0000 mL | OROMUCOSAL | Status: DC
  Administered 2024-07-21: 15 mL via OROMUCOSAL
  Filled 2024-07-20 (×3): qty 15

## 2024-07-20 MED ORDER — ONDANSETRON HCL 4 MG/2ML IJ SOLN: 4.0000 mg | Freq: Four times a day (QID) | INTRAMUSCULAR | Status: DC | PRN

## 2024-07-20 MED ORDER — POLYETHYLENE GLYCOL 3350 17 G PO PACK
17.0000 g | PACK | Freq: Every day | ORAL | Status: DC
  Administered 2024-07-21 – 2024-07-26 (×5): 17 g via ORAL
  Filled 2024-07-20 (×7): qty 1

## 2024-07-20 MED ORDER — ENOXAPARIN SODIUM 60 MG/0.6ML IJ SOSY
0.5000 mg/kg | PREFILLED_SYRINGE | INTRAMUSCULAR | Status: DC
  Administered 2024-07-20 – 2024-07-26 (×7): 50 mg via SUBCUTANEOUS
  Filled 2024-07-20 (×7): qty 0.6

## 2024-07-20 MED ORDER — DIVALPROEX SODIUM 500 MG PO DR TAB: 500.0000 mg | DELAYED_RELEASE_TABLET | Freq: Two times a day (BID) | ORAL | Status: DC

## 2024-07-20 MED ORDER — ALBUTEROL SULFATE (2.5 MG/3ML) 0.083% IN NEBU: 2.5000 mg | INHALATION_SOLUTION | Freq: Four times a day (QID) | RESPIRATORY_TRACT | Status: DC | PRN

## 2024-07-20 MED ORDER — ORAL CARE MOUTH RINSE: 15.0000 mL | OROMUCOSAL | Status: DC | PRN

## 2024-07-20 NOTE — ED Provider Notes (Signed)
 [facility] Provider Note    Event Date/Time   First MD Initiated Contact with Patient 07/20/24 1237     (approximate)   History   Chief Complaint: Altered Mental Status and Fall   HPI  Kelly [name] is a 58 y.o. female who is brought to the ED with suspected seizure.  Patient has known seizure disorder along with schizoaffective disorder, EMS was called to the patient's home for help after a neighbor found the patient to be not at her baseline with no evidence of head trauma.  Reviewing electronic medical records patient was admitted to the hospital a few weeks ago for seizure, discharged on August 13 to SNF.  EMS report patient was discharged from [facility] care 2 days ago.  Patient denies any focal pain except for her lip.  Neuro plan: - Continue Risperdal  2 mg nightly for hallucinations. - Continue taking Lamictal  200 mg twice a day. - Continue taking Depakote  500 mg twice daily.  - Continue taking Vimpat  200 mg twice daily. - Continue taking Nortriptyline  to 50 mg nightly for head pain, neck pain, and headaches. - Continue taking Losartan  25 mg twice daily. - Continue taking Gabapentin  300 mg in the morning and 900 mg at night.       Past Medical History:  Diagnosis Date   Acid reflux    Anxiety    Depression    Hypercholesteremia    Hypertension    Myocardial infarction (HCC)    Schizophrenia (HCC)    Seizures (HCC)    Sleep apnea     Current Outpatient Rx   Order #: [id number] Class: Historical Med   Order #: [id number] Class: Historical Med   Order #: [id number] Class: Normal   Order #: [id number] Class: Normal   Order #: [id number] Class: Normal   Order #: [id number] Class: Historical Med   Order #: [id number] Class: No Print   Order #: [id number] Class: No Print   Order #: [id number] Class: Normal   Order #: [id number] Class: Normal   Order #: [id number] Class: Normal   Order #: [id number] Class: Historical Med   Order #:  [id number] Class: Historical Med   Order #: [id number] Class: No Print   Order #: [id number] Class: Historical Med   Order #: [id number] Class: Normal   Order #: [id number] Class: Historical Med    Past Surgical History:  Procedure Laterality Date   CARDIAC CATHETERIZATION     CESAREAN SECTION     DILATION AND CURETTAGE OF UTERUS     FOOT SURGERY     TONSILLECTOMY      Physical Exam   Triage Vital Signs: ED Triage Vitals  Encounter Vitals Group     BP 07/20/24 1245 (!) 144/63     Girls Systolic BP Percentile --      Girls Diastolic BP Percentile --      Boys Systolic BP Percentile --      Boys Diastolic BP Percentile --      Pulse Rate 07/20/24 1245 97     Resp 07/20/24 1245 17     Temp 07/20/24 1245 98.7 F (37.1 C)     Temp Source 07/20/24 1245 Oral     SpO2 07/20/24 1244 99 %     Weight 07/20/24 1247 214 lb 15.2 oz (97.5 kg)     Height 07/20/24 1247 5' 6 (1.676 m)     Head Circumference --      Peak Flow --      Pain Score --      Pain Loc --  Pain Education --      Exclude from Growth Chart --     Most recent vital signs: Vitals:   07/20/24 1244 07/20/24 1245  BP:  (!) 144/63  Pulse:  97  Resp:  17  Temp:  98.7 F (37.1 C)  SpO2: 99% 95%    General: Awake, no distress.  Not oriented CV:  Good peripheral perfusion.  Regular rate rhythm Resp:  Normal effort.  Clear lungs Abd:  No distention.  Soft nontender Other:  Full range of motion all extremities, moving all extremities symmetrically.  There is contusion to the midline upper lip without apparent intraoral injury.   ED Results / Procedures / Treatments   Labs (all labs ordered are listed, but only abnormal results are displayed) Labs Reviewed  COMPREHENSIVE METABOLIC PANEL WITH GFR  CBC WITH DIFFERENTIAL/PLATELET     EKG Interpreted by me Sinus rhythm rate of 96.  Normal axis, normal intervals.  Normal QRS ST segments T waves.   RADIOLOGY    PROCEDURES:  Procedures   MEDICATIONS  ORDERED IN ED: Medications - No data to display   IMPRESSION / MDM / ASSESSMENT AND PLAN / ED COURSE  I reviewed the triage vital signs and the nursing notes.  DDx: Seizure, electrolyte derangement, AKI, anemia  Patient's presentation is most consistent with acute presentation with potential threat to life or bodily function.     Clinical Course as of 07/20/24 1520  Thu Jul 20, 2024  1255 This patient with seizure disorder and schizoaffective disorder comes to the ED due to a likely seizure.  Has evidence of new facial trauma.  She is interactive but not oriented.  Will obtain labs.  Medication compliance has been questionable in the past. [PS]    Clinical Course User Index [PS] Kelly Pastor, MD    ----------------------------------------- 3:13 PM on 07/20/2024 ----------------------------------------- Awake and alert, back to baseline.  Doubt intracranial hemorrhage or C-spine injury.  Patient is agreeable with hospitalization for reinitiation of her medications to ensure his seizures are under control.  She is interested in returning to SNF given her lack of ability to take care of herself at home.  ----------------------------------------- 3:20 PM on 07/20/2024 ----------------------------------------- Case discussed with hospitalist who will evaluate   FINAL CLINICAL IMPRESSION(S) / ED DIAGNOSES   Final diagnoses:  Seizure (HCC)  Contusion of face, initial encounter     Rx / DC Orders   ED Discharge Orders     None        Note:  This document was prepared using Dragon voice recognition software and may include unintentional dictation errors.   Kelly Pastor, MD 07/20/24 1520

## 2024-07-20 NOTE — ED Notes (Signed)
 This RN called lab to [facility] for BW. This RN Attempted x2.

## 2024-07-20 NOTE — Progress Notes (Signed)
 Patient transferred to room 260. Report given to Don[place], RN assuming care of patient.   [facility]  returned to [facility] in Pharmacy.

## 2024-07-20 NOTE — ED Triage Notes (Signed)
 Pt BIB [facility] from home due to new onset of altered mental status and possible fall. Pt was d/c from [facility] care 2 days ago for seizures. EMS reports neighbor called due to pt calling out for help. Neighbor states she is not at her baseline. Pt presents to the ED with a swollen lip. Pt had hx of falls and seizures. Has a diagnosis of cognitive communication deficit.   97 RA 105 CBG 140/73

## 2024-07-20 NOTE — H&P (Signed)
 History and Physical    Kelly [name] FMW:[id number] DOB: 1966-07-31 DOA: 07/20/2024  PCP: Wellington Curtis LABOR, FNP (Confirm with patient/family/NH records and if not entered, this has to be entered at [facility] point of entry) Patient coming from: Home  I have personally briefly reviewed patient's old medical records in [facility] Link  Chief Complaint: AMS  HPI: Kelly [name] is a 58 y.o. female with medical history significant of seizure disorder, HTN, CAD, anxiety/depression, schizoaffective disorder, presented with altered mentation.  Patient appears to be postictal and confused unable to provide any useful information.  All history obtained by discussion with ED staff and ED physician and review of chart.  Patient was recently hospitalized for seizure and altered mentation secondary to medical event with antiseizure medications, on discharge she was discharged to nursing home.  2 days ago however patient signed herself out of nursing home and brought back home.  And this morning, patient called her neighbor stating that she did not feel well.  Neighbor came to her home to see her and found patient  not at her baseline with a swollen lip and called EMS.   ED Course: Afebrile, blood pressure 140/60 O2 sat ration 99% on room air.  Review of Systems: As per HPI otherwise 14 point review of systems negative.   Past Medical History:  Diagnosis Date   Acid reflux    Anxiety    Depression    Hypercholesteremia    Hypertension    Myocardial infarction (HCC)    Schizophrenia (HCC)    Seizures (HCC)    Sleep apnea     Past Surgical History:  Procedure Laterality Date   CARDIAC CATHETERIZATION     CESAREAN SECTION     DILATION AND CURETTAGE OF UTERUS     FOOT SURGERY     TONSILLECTOMY       reports that she has been smoking cigarettes. She started smoking about 23 years ago. She has never used smokeless tobacco. She reports current alcohol use. She reports that she  does not use drugs.  Allergies  Allergen Reactions   Percocet [Oxycodone -Acetaminophen ] Nausea And Vomiting    Family History  Problem Relation Age of Onset   Breast cancer Mother    Arthritis Mother    Other Father        old age   Diabetes Maternal Aunt    Ovarian cancer Neg Hx    Colon cancer Neg Hx      Prior to Admission medications   Medication Sig Start Date End Date Taking? Authorizing Provider  nitrofurantoin, macrocrystal-monohydrate, (MACROBID) 100 MG capsule Take 100 mg by mouth daily. 07/14/24  Yes [provider]  traMADol  (ULTRAM ) 50 MG tablet Take 50 mg by mouth every 6 (six) hours as needed. 07/11/24  Yes [provider]  amLODipine  (NORVASC ) 5 MG tablet Take 1 tablet (5 mg total) by mouth daily. 05/04/24   Wellington Curtis LABOR, FNP  atorvastatin  (LIPITOR) 40 MG tablet Take 1 tablet (40 mg total) by mouth at bedtime. 02/14/24   Clifton, Kellie A, FNP  cloNIDine  (CATAPRES ) 0.1 MG tablet Take 1 tablet (0.1 mg total) by mouth daily. 02/14/24   Clifton, Kellie A, FNP  divalproex  (DEPAKOTE ) 500 MG DR tablet Take 500 mg by mouth 2 (two) times daily. 11/30/22   [provider]  gabapentin  (NEURONTIN ) 300 MG capsule Take 1 capsule (300 mg total) by mouth in the morning. 06/29/24   Wouk, Devaughn Sayres, MD  gabapentin  (NEURONTIN ) 300  MG capsule Take 3 capsules (900 mg total) by mouth at bedtime. 06/28/24   Wouk, Devaughn Sayres, MD  lamoTRIgine  (LAMICTAL ) 200 MG tablet Take 1 tablet (200 mg total) by mouth 2 (two) times daily. 07/13/18   Sherial Bail, MD  losartan  (COZAAR ) 25 MG tablet Take 1 tablet (25 mg total) by mouth daily. 05/04/24   Wellington Curtis LABOR, FNP  montelukast  (SINGULAIR ) 10 MG tablet Take 1 tablet (10 mg total) by mouth at bedtime. 05/04/24   Wellington Curtis LABOR, FNP  nortriptyline  (PAMELOR ) 50 MG capsule Take 50 mg by mouth at bedtime. 11/27/19   [provider]  omeprazole (PRILOSEC) 40 MG capsule Take 40 mg by mouth daily. 11/10/19    [provider]  polyethylene glycol (MIRALAX  / GLYCOLAX ) 17 g packet Take 17 g by mouth daily. 06/29/24   Wouk, Devaughn Sayres, MD  risperiDONE  (RISPERDAL ) 2 MG tablet Take 2 mg by mouth at bedtime. 09/01/23   [provider]  VENTOLIN  HFA 108 (90 Base) MCG/ACT inhaler Inhale 2 puffs into the lungs every 6 (six) hours as needed. 12/20/23   Wellington Curtis LABOR, FNP  VIMPAT  200 MG TABS tablet Take 200 mg by mouth 2 (two) times daily.  02/12/16   [provider]    Physical Exam: Vitals:   07/20/24 1244 07/20/24 1245 07/20/24 1247 07/20/24 1500  BP:  (!) 144/63  129/68  Pulse:  97  87  Resp:  17  14  Temp:  98.7 F (37.1 C)    TempSrc:  Oral    SpO2: 99% 95%  99%  Weight:   97.5 kg   Height:   5' 6 (1.676 m)     Constitutional: NAD, calm, comfortable Vitals:   07/20/24 1244 07/20/24 1245 07/20/24 1247 07/20/24 1500  BP:  (!) 144/63  129/68  Pulse:  97  87  Resp:  17  14  Temp:  98.7 F (37.1 C)    TempSrc:  Oral    SpO2: 99% 95%  99%  Weight:   97.5 kg   Height:   5' 6 (1.676 m)    Eyes: PERRL, lids and conjunctivae normal ENMT: Mucous membranes are moist. Posterior pharynx clear of any exudate or lesions.Normal dentition.  Left lower lip biting mark and swollen Neck: normal, supple, no masses, no thyromegaly Respiratory: clear to auscultation bilaterally, no wheezing, no crackles. Normal respiratory effort. No accessory muscle use.  Cardiovascular: Regular rate and rhythm, no murmurs / rubs / gallops. No extremity edema. 2+ pedal pulses. No carotid bruits.  Abdomen: no tenderness, no masses palpated. No hepatosplenomegaly. Bowel sounds positive.  Musculoskeletal: no clubbing / cyanosis. No joint deformity upper and lower extremities. Good ROM, no contractures. Normal muscle tone.  Skin: no rashes, lesions, ulcers. No induration Neurologic: No facial droop, moving all limbs psychiatric: Arousable to physical touch, oriented to person, confused about time  and place. Normal mood.     Labs on Admission: I have personally reviewed following labs and imaging studies  CBC: No results for input(s): WBC, NEUTROABS, HGB, HCT, MCV, PLT in the last 168 hours. Basic Metabolic Panel: No results for input(s): NA, K, CL, CO2, GLUCOSE, BUN, CREATININE, CALCIUM , MG, PHOS in the last 168 hours. GFR: Estimated Creatinine Clearance: 78 mL/min (by C-G formula based on SCr of 0.96 mg/dL). Liver Function Tests: No results for input(s): AST, ALT, ALKPHOS, BILITOT, PROT, ALBUMIN in the last 168 hours. No results for input(s): LIPASE, AMYLASE in the last 168 hours. No results  for input(s): AMMONIA in the last 168 hours. Coagulation Profile: No results for input(s): INR, PROTIME in the last 168 hours. Cardiac Enzymes: No results for input(s): CKTOTAL, CKMB, CKMBINDEX, TROPONINI in the last 168 hours. BNP (last 3 results) No results for input(s): PROBNP in the last 8760 hours. HbA1C: No results for input(s): HGBA1C in the last 72 hours. CBG: No results for input(s): GLUCAP in the last 168 hours. Lipid Profile: No results for input(s): CHOL, HDL, LDLCALC, TRIG, CHOLHDL, LDLDIRECT in the last 72 hours. Thyroid  Function Tests: No results for input(s): TSH, T4TOTAL, FREET4, T3FREE, THYROIDAB in the last 72 hours. Anemia Panel: No results for input(s): VITAMINB12, FOLATE, FERRITIN, TIBC, IRON, RETICCTPCT in the last 72 hours. Urine analysis:    Component Value Date/Time   COLORURINE STRAW (A) 03/09/2023 0150   APPEARANCEUR CLEAR (A) 03/09/2023 0150   LABSPEC 1.006 03/09/2023 0150   PHURINE 7.0 03/09/2023 0150   GLUCOSEU NEGATIVE 03/09/2023 0150   HGBUR NEGATIVE 03/09/2023 0150   BILIRUBINUR NEGATIVE 03/09/2023 0150   KETONESUR NEGATIVE 03/09/2023 0150   PROTEINUR NEGATIVE 03/09/2023 0150   NITRITE NEGATIVE 03/09/2023 0150   LEUKOCYTESUR NEGATIVE  03/09/2023 0150    Radiological Exams on Admission: No results found.  EKG: Independently reviewed.  Sinus rhythm, no acute ST changes.  Assessment/Plan Principal Problem:   Seizure (HCC)  (please populate well all problems here in Problem List. (For example, if patient is on BP meds at home and you resume or decide to hold them, it is a problem that needs to be her. Same for CAD, COPD, HLD and so on)  Recurrent seizure, likely noncompliant with antiseizure medication Acute metabolic encephalopathy, likely postictal - Given her history of previous noncompliance as well as history of sending herself out of nursing home 2 days ago and ended up in altered mentation today, highly suspect patient had another seizure -Check valproic  acid level -Check UDS -GCS=10, admit to telemetry - Given the repeated seizure episode within 1 month, suspect patient's baseline psychiatry problem affecting her cognitive ability, will consult psychiatry to see whether her psychiatry medication need to be modified. - Seizure precaution - Fall precaution -N.p.o. and IV fluids for tonight - Other DDx, will check CT head to rule out any acute intracranial changes, will also send UDS and UA.  Anxiety/depression History of schizoaffective disorder - Continue Risperdal  and Seroquel  - Consult psychiatry  HTN - Blood pressure borderline low, hold off amlodipine  losartan  and clonidine  today - As needed hydralazine   Obesity - BMI= 34 - Calorie control recommended  DVT prophylaxis: Lovenox  Code Status: Full code Family Communication: None at bedside Disposition Plan: Expect less than 2 midnight hospital stay Consults called: Psychiatry Admission status: Telemetry observation   Cort ONEIDA Mana MD [facility] Pager [phone]  07/20/2024, 3:32 PM

## 2024-07-20 NOTE — Transitions of Care (Post Inpatient/ED Visit) (Signed)
 07/20/2024  Name: Kelly [name] MRN: [id number] DOB: 1965/12/04  Today's TOC FU Call Status: Today's TOC FU Call Status:: Successful TOC FU Call Completed Unsuccessful Call (1st Attempt) Date: 07/19/24 [facility] FU Call Complete Date: 07/20/24 Patient's Name and Date of Birth confirmed.  Transition Care Management Follow-up Telephone Call Date of Discharge: 07/18/24 Discharge Facility: [facility]) Type of Discharge: Inpatient Admission Primary Inpatient Discharge Diagnosis:: seizure How have you been since you were released from the hospital?: Better Any questions or concerns?: No  Items Reviewed: Did you receive and understand the discharge instructions provided?: Yes Medications obtained,verified, and reconciled?: Yes (Medications Reviewed) Any new allergies since your discharge?: No Dietary orders reviewed?: Yes Do you have support at home?: No  Medications Reviewed Today: Medications Reviewed Today     Reviewed by Emmitt Pan, LPN (Licensed Practical Nurse) on 07/20/24 at 1104  Med List Status: <None>   Medication Order Taking? Sig Documenting Provider Last Dose Status Informant  amLODipine  (NORVASC ) 5 MG tablet [id number] Yes Take 1 tablet (5 mg total) by mouth daily. Wellington Curtis LABOR, FNP  Active Self, Other, Pharmacy Records  atorvastatin  (LIPITOR) 40 MG tablet [id number] Yes Take 1 tablet (40 mg total) by mouth at bedtime. Wellington Curtis LABOR, FNP  Active Self, Pharmacy Records, Other  cloNIDine  (CATAPRES ) 0.1 MG tablet [id number] Yes Take 1 tablet (0.1 mg total) by mouth daily. Wellington Curtis LABOR, FNP  Active Self, Pharmacy Records, Other  divalproex  (DEPAKOTE ) 500 MG DR tablet [id number] Yes Take 500 mg by mouth 2 (two) times daily. [provider]  Active Self, Pharmacy Records, Other  gabapentin  (NEURONTIN ) 300 MG capsule [id number] Yes Take 1 capsule (300 mg total) by mouth in the morning. Wouk, Devaughn Sayres, MD  Active    gabapentin  (NEURONTIN ) 300 MG capsule [id number] Yes Take 3 capsules (900 mg total) by mouth at bedtime. Wouk, Devaughn Sayres, MD  Active   lamoTRIgine  (LAMICTAL ) 200 MG tablet [id number] Yes Take 1 tablet (200 mg total) by mouth 2 (two) times daily. Sherial Bail, MD  Active Self, Pharmacy Records, Other  losartan  (COZAAR ) 25 MG tablet [id number] Yes Take 1 tablet (25 mg total) by mouth daily. Wellington Curtis LABOR, FNP  Active Self, Other, Pharmacy Records  montelukast  (SINGULAIR ) 10 MG tablet [id number] Yes Take 1 tablet (10 mg total) by mouth at bedtime. Wellington Curtis LABOR, FNP  Active Self, Other, Pharmacy Records  nortriptyline  (PAMELOR ) 50 MG capsule [id number] Yes Take 50 mg by mouth at bedtime. [provider]  Active Self, Pharmacy Records, Other           Med Note CATHY, PAULA   Wed Jan 12, 2024  2:37 PM) Been taking 1 capsule at bedtime  omeprazole (PRILOSEC) 40 MG capsule [id number] Yes Take 40 mg by mouth daily. [provider]  Active Self, Pharmacy Records, Other  polyethylene glycol (MIRALAX  / GLYCOLAX ) 17 g packet [id number] Yes Take 17 g by mouth daily. Wouk, Devaughn Sayres, MD  Active   risperiDONE  (RISPERDAL ) 2 MG tablet [id number] Yes Take 2 mg by mouth at bedtime. [provider]  Active Self, Pharmacy Records, Other  VENTOLIN  HFA 108 [phone] Base) MCG/ACT inhaler [id number] Yes Inhale 2 puffs into the lungs every 6 (six) hours as needed. Wellington Curtis LABOR, FNP  Active Self, Pharmacy Records, Other           Med Note [facility], TIFFANY A   Thu Jun 22, 2024  7:47 AM) prn  VIMPAT  200 MG TABS  tablet [id number] Yes Take 200 mg by mouth 2 (two) times daily.  [provider]  Active Self, Pharmacy Records, Other           Med Note (HARDEN, SUMMER D   Wed Apr 08, 2016 11:36 AM)              Home Care and Equipment/Supplies: Were Home Health Services Ordered?: Yes Name of Home Health Agency:: unknown Has Agency set up a time to come to your home?: Yes First  Home Health Visit Date: 07/21/24 Any new equipment or medical supplies ordered?: NA  Functional Questionnaire: Do you need assistance with bathing/showering or dressing?: No Do you need assistance with meal preparation?: No Do you need assistance with eating?: No Do you have difficulty maintaining continence: No Do you need assistance with getting out of bed/getting out of a chair/moving?: No Do you have difficulty managing or taking your medications?: No  Follow up appointments reviewed: PCP Follow-up appointment confirmed?: No (declined) MD Provider Line Number:[phone] Given: No Specialist Hospital Follow-up appointment confirmed?: NA Do you need transportation to your follow-up appointment?: No Do you understand care options if your condition(s) worsen?: Yes-patient verbalized understanding    SIGNATURE Julian Lemmings, LPN [facility] Nurse Health Advisor Direct Dial [phone]

## 2024-07-20 NOTE — Progress Notes (Signed)
 Patients eyes were shaking back and fourth. Body was mildly shaking. Start time 1705 end time 1708. Dr. Cort IVAR Mana notified.

## 2024-07-20 NOTE — Telephone Encounter (Signed)
 Patient went to ER>

## 2024-07-20 NOTE — ED Notes (Signed)
 This RN called lab again.

## 2024-07-20 NOTE — Telephone Encounter (Signed)
 FYI Only or Action Required?: FYI only for provider.  Patient was last seen in primary care on 05/04/2024 by Wellington Curtis LABOR, FNP.  Called Nurse Triage reporting Gait Problem.  Symptoms began several days ago.  Interventions attempted: Nothing.  Symptoms are: unchanged.  Triage Disposition: Call EMS 911 Now  Patient/caregiver understands and will follow disposition?: No, refuses disposition Reason for Disposition  Sounds like a life-threatening emergency to the triager  Answer Assessment - Initial Assessment Questions Patient and female party in her home were transferred to this RN for triage. Female party reports that he has noticed patient being wobbly on her feet since being discharged from rehab/PT on 07/18/24. Patient was in ED on 07/10/24 for possible seizure/fall.   This RN recommended patient return to the ED via 911 (note: pt has no transportation available) and provided rationales. Patient refused. Reviewed rationales with both the patient and her friend and patient refused again, stating that her aide will be in the home tomorrow. This RN reviewed rationales once more and advised patient and female party to call 911 if she changes her mind.  1. SYMPTOM: What is the main symptom you are concerned about? (e.g., weakness, numbness)     Ataxia noted since returning home from rehab, may have started sooner  3. LAST NORMAL: When was the last time you (the patient) were normal (no symptoms)?     Unknown, states was present when she returned home from rehab on 07/18/24  4. PATTERN Does this come and go, or has it been constant since it started?  Is it present now?     Constant/present now  Protocols used: Neurologic [facility] Copied from CRM [id number]. Topic: Clinical - Red Word Triage >> Jul 20, 2024 10:19 AM Roselie BROCKS wrote: [facility] that prompted transfer to Nurse Triage: Patient was in hospital and released Tuesday, Tuesday night she fell , and hurt her self, she is now  feeling very weak, unstable,not able to walk,very uncoordinated and in pain , the gentleman with patient is not able to take her to the hospital.

## 2024-07-20 NOTE — ED Notes (Signed)
 This RN called [facility] to ask about patient history and placement.

## 2024-07-20 NOTE — Progress Notes (Signed)
 Pt post itical unable to complete admission profile at this time

## 2024-07-20 NOTE — Telephone Encounter (Signed)
 Will emphasize needs emergent treatment.

## 2024-07-20 NOTE — Progress Notes (Signed)
 VAST consult for PIV placement. Assessment of bilateral arms with US  . No appropriate vein found for PIV/midline placement at this time. Recommend CVC/PICC placement. RN notified. Powell Bowler, RN VAST

## 2024-07-20 NOTE — Progress Notes (Signed)
 Floor nurse reported patient had 2 seizures after arriving in the floor and last hour each episode lasted 1-40 minutes and broke by itself no orts that was administered.  I ordered 1 dose of Keppra  500 mg IV x 1, and will give another Keppra  500 IV x 1 now.  Communicated with neurology, who recommended that if the patient mentation does not come to back to baseline, patient will need to go to [facility] for continuous EEG monitoring to rule out status epilepticus.  I then went back to see the patient, patient is drowsy but arousable to voice, she is orientated to person and time but somewhat confused about place.  Patient insisted that she has been taking all her seizure medications and she reported that she fell last night and complaining about right knee pain.  Will change Vimpat  and Depakote  to IV.

## 2024-07-21 ENCOUNTER — Inpatient Hospital Stay

## 2024-07-21 DIAGNOSIS — Z8261 Family history of arthritis: Secondary | ICD-10-CM | POA: Diagnosis not present

## 2024-07-21 DIAGNOSIS — E66811 Obesity, class 1: Secondary | ICD-10-CM | POA: Diagnosis present

## 2024-07-21 DIAGNOSIS — Z91148 Patient's other noncompliance with medication regimen for other reason: Secondary | ICD-10-CM | POA: Diagnosis not present

## 2024-07-21 DIAGNOSIS — G4730 Sleep apnea, unspecified: Secondary | ICD-10-CM | POA: Diagnosis present

## 2024-07-21 DIAGNOSIS — Z6834 Body mass index (BMI) 34.0-34.9, adult: Secondary | ICD-10-CM | POA: Diagnosis not present

## 2024-07-21 DIAGNOSIS — F32A Depression, unspecified: Secondary | ICD-10-CM | POA: Diagnosis present

## 2024-07-21 DIAGNOSIS — I10 Essential (primary) hypertension: Secondary | ICD-10-CM | POA: Diagnosis present

## 2024-07-21 DIAGNOSIS — W19XXXA Unspecified fall, initial encounter: Secondary | ICD-10-CM | POA: Diagnosis present

## 2024-07-21 DIAGNOSIS — Z803 Family history of malignant neoplasm of breast: Secondary | ICD-10-CM | POA: Diagnosis not present

## 2024-07-21 DIAGNOSIS — Y92009 Unspecified place in unspecified non-institutional (private) residence as the place of occurrence of the external cause: Secondary | ICD-10-CM | POA: Diagnosis not present

## 2024-07-21 DIAGNOSIS — E7800 Pure hypercholesterolemia, unspecified: Secondary | ICD-10-CM | POA: Diagnosis present

## 2024-07-21 DIAGNOSIS — F419 Anxiety disorder, unspecified: Secondary | ICD-10-CM | POA: Diagnosis present

## 2024-07-21 DIAGNOSIS — G40909 Epilepsy, unspecified, not intractable, without status epilepticus: Secondary | ICD-10-CM | POA: Diagnosis present

## 2024-07-21 DIAGNOSIS — I2510 Atherosclerotic heart disease of native coronary artery without angina pectoris: Secondary | ICD-10-CM | POA: Diagnosis present

## 2024-07-21 DIAGNOSIS — Z9151 Personal history of suicidal behavior: Secondary | ICD-10-CM | POA: Diagnosis not present

## 2024-07-21 DIAGNOSIS — K219 Gastro-esophageal reflux disease without esophagitis: Secondary | ICD-10-CM | POA: Diagnosis present

## 2024-07-21 DIAGNOSIS — F17210 Nicotine dependence, cigarettes, uncomplicated: Secondary | ICD-10-CM | POA: Diagnosis present

## 2024-07-21 DIAGNOSIS — Z833 Family history of diabetes mellitus: Secondary | ICD-10-CM | POA: Diagnosis not present

## 2024-07-21 DIAGNOSIS — Z79899 Other long term (current) drug therapy: Secondary | ICD-10-CM | POA: Diagnosis not present

## 2024-07-21 DIAGNOSIS — E875 Hyperkalemia: Secondary | ICD-10-CM | POA: Diagnosis present

## 2024-07-21 DIAGNOSIS — S0083XA Contusion of other part of head, initial encounter: Secondary | ICD-10-CM | POA: Diagnosis present

## 2024-07-21 DIAGNOSIS — R569 Unspecified convulsions: Secondary | ICD-10-CM | POA: Diagnosis present

## 2024-07-21 DIAGNOSIS — N179 Acute kidney failure, unspecified: Secondary | ICD-10-CM | POA: Diagnosis present

## 2024-07-21 DIAGNOSIS — Z885 Allergy status to narcotic agent status: Secondary | ICD-10-CM | POA: Diagnosis not present

## 2024-07-21 DIAGNOSIS — F259 Schizoaffective disorder, unspecified: Secondary | ICD-10-CM | POA: Diagnosis present

## 2024-07-21 DIAGNOSIS — I252 Old myocardial infarction: Secondary | ICD-10-CM | POA: Diagnosis not present

## 2024-07-21 DIAGNOSIS — D649 Anemia, unspecified: Secondary | ICD-10-CM | POA: Diagnosis present

## 2024-07-21 LAB — BASIC METABOLIC PANEL WITH GFR
Anion gap: 9 (ref 5–15)
BUN: 20 mg/dL (ref 6–20)
CO2: 25 mmol/L (ref 22–32)
Calcium: 8.6 mg/dL — ABNORMAL LOW (ref 8.9–10.3)
Chloride: 103 mmol/L (ref 98–111)
Creatinine, Ser: 1.04 mg/dL — ABNORMAL HIGH (ref 0.44–1.00)
GFR, Estimated: >60 mL/min (ref >60)
Glucose, Bld: 104 mg/dL — ABNORMAL HIGH (ref 70–99)
Potassium: 4.2 mmol/L (ref 3.5–5.1)
Sodium: 137 mmol/L (ref 135–145)

## 2024-07-21 LAB — CBC WITH DIFFERENTIAL/PLATELET
Abs Immature Granulocytes: 0.05 K/uL (ref 0.00–0.07)
Basophils Absolute: 0 K/uL (ref 0.0–0.1)
Basophils Relative: 0 %
Eosinophils Absolute: 0.3 K/uL (ref 0.0–0.5)
Eosinophils Relative: 2 %
HCT: 30.6 % — ABNORMAL LOW (ref 36.0–46.0)
Hemoglobin: 9.8 g/dL — ABNORMAL LOW (ref 12.0–15.0)
Immature Granulocytes: 1 %
Lymphocytes Relative: 34 %
Lymphs Abs: 3.7 K/uL (ref 0.7–4.0)
MCH: 27.7 pg (ref 26.0–34.0)
MCHC: 32 g/dL (ref 30.0–36.0)
MCV: 86.4 fL (ref 80.0–100.0)
Monocytes Absolute: 0.6 K/uL (ref 0.1–1.0)
Monocytes Relative: 6 %
Neutro Abs: 6.1 K/uL (ref 1.7–7.7)
Neutrophils Relative %: 57 %
Platelets: 257 K/uL (ref 150–400)
RBC: 3.54 MIL/uL — ABNORMAL LOW (ref 3.87–5.11)
RDW: 17.4 % — ABNORMAL HIGH (ref 11.5–15.5)
WBC: 10.7 K/uL — ABNORMAL HIGH (ref 4.0–10.5)
nRBC: 0.2 % (ref 0.0–0.2)

## 2024-07-21 LAB — GLUCOSE, CAPILLARY
Glucose-Capillary: 103 mg/dL — ABNORMAL HIGH (ref 70–99)
Glucose-Capillary: 69 mg/dL — ABNORMAL LOW (ref 70–99)
Glucose-Capillary: 86 mg/dL (ref 70–99)
Glucose-Capillary: 88 mg/dL (ref 70–99)
Glucose-Capillary: 95 mg/dL (ref 70–99)
Glucose-Capillary: 98 mg/dL (ref 70–99)

## 2024-07-21 MED ORDER — GABAPENTIN 300 MG PO CAPS: 300.0000 mg | ORAL_CAPSULE | Freq: Every day | ORAL | Status: DC

## 2024-07-21 MED ORDER — ORAL CARE MOUTH RINSE: 15.0000 mL | OROMUCOSAL | Status: DC | PRN

## 2024-07-21 MED ORDER — LACOSAMIDE 50 MG PO TABS
200.0000 mg | ORAL_TABLET | Freq: Two times a day (BID) | ORAL | Status: DC
  Administered 2024-07-21 – 2024-07-27 (×13): 200 mg via ORAL
  Filled 2024-07-21 (×13): qty 4

## 2024-07-21 MED ORDER — GABAPENTIN 300 MG PO CAPS
300.0000 mg | ORAL_CAPSULE | Freq: Two times a day (BID) | ORAL | Status: DC
  Administered 2024-07-21 – 2024-07-27 (×12): 300 mg via ORAL
  Filled 2024-07-21 (×12): qty 1

## 2024-07-21 MED ORDER — DIVALPROEX SODIUM 500 MG PO DR TAB
500.0000 mg | DELAYED_RELEASE_TABLET | Freq: Two times a day (BID) | ORAL | Status: DC
  Administered 2024-07-21 – 2024-07-27 (×13): 500 mg via ORAL
  Filled 2024-07-21 (×14): qty 1

## 2024-07-21 NOTE — Progress Notes (Addendum)
 Hypoglycemic Event  CBG: 69  Treatment: 8 oz juice/soda  Symptoms: None  Follow-up CBG: Time:0115 CBG Result 95.  Possible Reasons for Event: Inadequate meal intake  Comments/MD notified:Notified Erminio with new CBG     [place] Kelly [name]

## 2024-07-21 NOTE — Progress Notes (Signed)
 Eeg done

## 2024-07-21 NOTE — Progress Notes (Signed)
 Unable to finish admission questions due to patients not being oriented to answer questions

## 2024-07-21 NOTE — Plan of Care (Signed)
  Problem: Education: Goal: Knowledge of General Education information will improve Description: Including pain rating scale, medication(s)/side effects and non-pharmacologic comfort measures Outcome: Progressing   Problem: Clinical Measurements: Goal: Respiratory complications will improve Outcome: Progressing   Problem: Clinical Measurements: Goal: Cardiovascular complication will be avoided Outcome: Progressing   Problem: Nutrition: Goal: Adequate nutrition will be maintained Outcome: Progressing   Problem: Elimination: Goal: Will not experience complications related to urinary retention Outcome: Progressing   Problem: Pain Managment: Goal: General experience of comfort will improve and/or be controlled Outcome: Progressing   Problem: Safety: Goal: Ability to remain free from injury will improve Outcome: Progressing

## 2024-07-21 NOTE — Procedures (Signed)
 History: 58 year old female with a history of seizures being evaluated for the same  EEG Duration: 31 minutes  Sedation: None  Patient State: Awake and asleep  Technique: This EEG was acquired with electrodes placed according to the International 10-20 electrode system (including Fp1, Fp2, F3, F4, C3, C4, P3, P4, O1, O2, T3, T4, T5, T6, A1, A2, Fz, Cz, Pz). The following electrodes were missing or displaced: none.   Background: The vast majority of the study is performed during sleep with symmetric appearing structures.  During a brief arousal, there is a posterior dominant rhythm of 8 Hz before she rapidly falls back asleep.  Photic stimulation: Physiologic driving is not performed  EEG Abnormalities: Normal sleep EEG  Clinical Interpretation: This normal EEG is recorded predominantly in the sleep state with only a brief arousal. There was no seizure or seizure predisposition recorded on this study. Please note that lack of epileptiform activity on EEG does not preclude the possibility of epilepsy.   Kelly Seals, MD [facility]   If 7pm- 7am, please page neurology on call as listed in AMION.

## 2024-07-21 NOTE — Plan of Care (Signed)
 No seizure activity for myself.  Patient responsive but still appears to have some intermittent confusion, particularly after sleeping.  Eating well.  Vitals remain stable.

## 2024-07-21 NOTE — Consult Note (Signed)
 NEUROLOGY CONSULT NOTE   Date of service: July 21, 2024 Patient Name: Kelly [name] MRN:  [id number] DOB:  31-May-1966 Chief Complaint: Seizures Requesting Provider: Franchot Novel, MD  History of Present Illness  Kelly [name] is a 58 y.o. female with hx of schizophrenia as well as seizures followed by Dr. Lane who is presenting with breakthrough seizures.  She was in a facility up until a couple of days ago, but though there was some question about her compliance, and the patient is adamant that she took all of her medications as prescribed and uses a pill planner send noticed that she did not miss any medications.  She denies any recent illnesses or other precipitants    Past History   Past Medical History:  Diagnosis Date   Acid reflux    Anxiety    Depression    Hypercholesteremia    Hypertension    Myocardial infarction (HCC)    Schizophrenia (HCC)    Seizures (HCC)    Sleep apnea     Past Surgical History:  Procedure Laterality Date   CARDIAC CATHETERIZATION     CESAREAN SECTION     DILATION AND CURETTAGE OF UTERUS     FOOT SURGERY     TONSILLECTOMY      Family History: Family History  Problem Relation Age of Onset   Breast cancer Mother    Arthritis Mother    Other Father        old age   Diabetes Maternal Aunt    Ovarian cancer Neg Hx    Colon cancer Neg Hx     Social History  reports that she has been smoking cigarettes. She started smoking about 23 years ago. She has never used smokeless tobacco. She reports current alcohol use. She reports that she does not use drugs.  Allergies  Allergen Reactions   Percocet [Oxycodone -Acetaminophen ] Nausea And Vomiting    Medications   Current Facility-Administered Medications:    0.9 %  sodium chloride  infusion, 75 mL/hr, Intravenous, Continuous, Laurita Cort DASEN, MD, Stopped at 07/21/24 0008   acetaminophen  (TYLENOL ) tablet 650 mg, 650 mg, Oral, Q6H PRN, Laurita Cort DASEN, MD    albuterol  (PROVENTIL ) (2.5 MG/3ML) 0.083% nebulizer solution 2.5 mg, 2.5 mg, Nebulization, Q6H PRN, Haynes Annabella SAILOR, RPH   atorvastatin  (LIPITOR) tablet 40 mg, 40 mg, Oral, QHS, Zhang, Ping T, MD   divalproex  (DEPAKOTE ) DR tablet 500 mg, 500 mg, Oral, BID, Franchot Novel, MD, 500 mg at 07/21/24 1015   enoxaparin  (LOVENOX ) injection 50 mg, 0.5 mg/kg, Subcutaneous, Q24H, Hallaji, Sheema M, RPH, 50 mg at 07/20/24 2202   gabapentin  (NEURONTIN ) capsule 300 mg, 300 mg, Oral, q AM, Laurita Cort T, MD, 300 mg at 07/21/24 1012   gabapentin  (NEURONTIN ) capsule 900 mg, 900 mg, Oral, QHS, Laurita Cort T, MD, 900 mg at 07/21/24 0020   hydrALAZINE  (APRESOLINE ) injection 5 mg, 5 mg, Intravenous, Q6H PRN, Laurita Cort T, MD   lacosamide  (VIMPAT ) tablet 200 mg, 200 mg, Oral, BID, Franchot Novel, MD, 200 mg at 07/21/24 1008   lamoTRIgine  (LAMICTAL ) tablet 200 mg, 200 mg, Oral, BID, Zhang, Ping T, MD, 200 mg at 07/21/24 1008   midazolam  PF (VERSED ) injection 10 mg, 10 mg, Intramuscular, Q5 Min x 2 PRN, Zhang, Ping T, MD   montelukast  (SINGULAIR ) tablet 10 mg, 10 mg, Oral, QHS, Zhang, Ping T, MD, 10 mg at 07/21/24 0020   nortriptyline  (PAMELOR ) capsule 50 mg, 50 mg, Oral, QHS, Laurita Cort DASEN, MD, 50  mg at 07/21/24 0136   ondansetron  (ZOFRAN ) injection 4 mg, 4 mg, Intravenous, Q6H PRN, Zhang, Ping T, MD   Oral care mouth rinse, 15 mL, Mouth Rinse, PRN, Franchot Novel, MD   pantoprazole  (PROTONIX ) EC tablet 40 mg, 40 mg, Oral, Daily, Laurita Manor T, MD, 40 mg at 07/21/24 1008   polyethylene glycol (MIRALAX  / GLYCOLAX ) packet 17 g, 17 g, Oral, Daily, Laurita Manor T, MD, 17 g at 07/21/24 1008   risperiDONE  (RISPERDAL ) tablet 2 mg, 2 mg, Oral, QHS, Zhang, Manor DASEN, MD   traMADol  (ULTRAM ) tablet 50 mg, 50 mg, Oral, Q6H PRN, Laurita Manor T, MD  Vitals   Vitals:   07/21/24 0054 07/21/24 0337 07/21/24 0537 07/21/24 0749  BP: 114/65 (!) 99/52 (!) 110/55 134/70  Pulse: 81 83 77 86  Resp: 18 18 18    Temp: 98.2 F  (36.8 C) 97.9 F (36.6 C) 97.7 F (36.5 C) 97.7 F (36.5 C)  TempSrc:  Oral Oral   SpO2: 99% 99% 97% 96%  Weight:      Height:        Body mass index is 34.69 kg/m.   Physical Exam   Constitutional: Appears well-developed and well-nourished.   Neurologic Examination    Neuro: Mental Status: Patient is awake, alert, oriented to person, place, month, year, and situation. Patient is able to give a clear and coherent history. No signs of aphasia or neglect Cranial Nerves: II: Visual Fields are full. Pupils are equal, round, and reactive to light.   III,IV, VI: EOMI without ptosis or diploplia.  V: Facial sensation is symmetric to temperature VII: Facial movement is symmetric.  VIII: hearing is intact to voice X: Uvula elevates symmetrically XII: tongue is midline without atrophy or fasciculations.  Motor: She has good strength throughout, but has significant tremor in bilateral upper extremities which she states is chronic and ongoing for many years. Sensory: Sensation is symmetric to light touch and temperature in the arms and legs. Cerebellar: Finger-nose-finger with tremor as above        Labs/Imaging/Neurodiagnostic studies   CBC:  Recent Labs  Lab 08-10-24 1940 07/21/24 1007  WBC 12.2* 10.7*  NEUTROABS 5.9 6.1  HGB 8.8* 9.8*  HCT 27.4* 30.6*  MCV 86.4 86.4  PLT 253 257   Basic Metabolic Panel:  Lab Results  Component Value Date   NA 137 07/21/2024   K 4.2 07/21/2024   CO2 25 07/21/2024   GLUCOSE 104 (H) 07/21/2024   BUN 20 07/21/2024   CREATININE 1.04 (H) 07/21/2024   CALCIUM  8.6 (L) 07/21/2024   GFRNONAA >60 07/21/2024   GFRAA >60 07/13/2018   Lipid Panel:  Lab Results  Component Value Date   LDLCALC 95 12/20/2023   HgbA1c:  Lab Results  Component Value Date   HGBA1C 5.4 12/20/2023   Alcohol Level     Component Value Date/Time   ETH <15 06/21/2024 2346   INR No results found for: INR APTT No results found for: APTT AED  levels:  Lab Results  Component Value Date   LAMOTRIGINE  25.7 (H) 06/21/2024     ASSESSMENT   Kelly [name] is a 58 y.o. female with breakthrough seizures 2 days after leaving the facility after being well-controlled while in the facility.  She is adamant that she missed no doses of her medications.  She is already on a very high dose of lacosamide , and I would be very hesitant to make any changes to either her valproate or lamotrigine  without  a lamotrigine  level as there is an interaction with VPA causing increases in levels of LTG.  Her gabapentin  is only dosed twice a day, and we could consider adding a third dose midday, but my suspicion is that this will not be the magic bullet that gets her to be seizure-free.  I discussed that the likelihood of someone becoming completely seizure-free if she is already on four antiepileptics would be relatively low, but it would not be unreasonable to try increasing her gabapentin .  She expressed a desire to give this a try.  RECOMMENDATIONS  Continue home lacosamide  200 twice daily Continue home lamotrigine  200 mg twice daily Continue home valproic  acid 500 mg twice daily Increase gabapentin  from 300 mg every morning and 900 mg every afternoon to 300 - 300 - 900 Neurology will be available with any further questions. ______________________________________________________________________    Bonney Aisha Seals, MD [facility]

## 2024-07-21 NOTE — Plan of Care (Signed)
  Problem: Clinical Measurements: Goal: Ability to maintain clinical measurements within normal limits will improve Outcome: Progressing   Problem: Clinical Measurements: Goal: Diagnostic test results will improve Outcome: Progressing   Problem: Clinical Measurements: Goal: Cardiovascular complication will be avoided Outcome: Progressing   Problem: Nutrition: Goal: Adequate nutrition will be maintained Outcome: Progressing   Problem: Pain Managment: Goal: General experience of comfort will improve and/or be controlled Outcome: Progressing   Problem: Safety: Goal: Ability to remain free from injury will improve Outcome: Progressing

## 2024-07-21 NOTE — Progress Notes (Signed)
 Progress Note   Patient: Kelly [name] FMW:[id number] DOB: 1966/06/22 DOA: 07/20/2024     0 DOS: the patient was seen and examined on 07/21/2024   Brief hospital course: Per H&P HPI  Latessa Tillis is a 58 y.o. female with medical history significant of seizure disorder, HTN, CAD, anxiety/depression, schizoaffective disorder, presented with altered mentation.   Patient appears to be postictal and confused unable to provide any useful information.  All history obtained by discussion with ED staff and ED physician and review of chart.  Patient was recently hospitalized for seizure and altered mentation secondary to medical event with antiseizure medications, on discharge she was discharged to nursing home.  2 days ago however patient signed herself out of nursing home and brought back home.  And this morning, patient called her neighbor stating that she did not feel well.  Neighbor came to her home to see her and found patient  not at her baseline with a swollen lip and called EMS.  Assessment and Plan:  Recurrent seizure Likely due to nonadherence with her medications.  Low suspicion for infection Eeg without seizure acitivity Valproic  acid level WNL appears to be drawn appropriately  Lamictal  pending  -F/u UDS  -Continue medications per neurology   AKI Encourage PO intake    Anxiety/depression H/o schizoaffective d/o  Continue risperdal  and seroquel     HTN Hold antihypertensives given soft blood pressures. Can likely resume in the AM.   Obesity  Complicates care       Subjective: Patient has had no further seizures per nursing team since last night. She has no chest pain, headache, or sob. She states that she sometimes forgets to take her medications because she has so many.   Physical Exam: Vitals:   07/21/24 0337 07/21/24 0537 07/21/24 0749 07/21/24 1532  BP: (!) 99/52 (!) 110/55 134/70 130/64  Pulse: 83 77 86 84  Resp: 18 18    Temp: 97.9 F (36.6  C) 97.7 F (36.5 C) 97.7 F (36.5 C) 98 F (36.7 C)  TempSrc: Oral Oral  Oral  SpO2: 99% 97% 96% 99%  Weight:      Height:       Physical Exam  Constitutional: In no distress.  Cardiovascular: Normal rate, regular rhythm. No lower extremity edema  Pulmonary: Non labored breathing on room air, no wheezing or rales.   Abdominal: Soft. Non distended and non tender Musculoskeletal: Normal range of motion.     Neurological: Alert and oriented to person, place, and time. Non focal  Skin: Skin is warm and dry.   Data Reviewed:     Latest Ref Rng & Units 07/21/2024   10:07 AM 07/20/2024    7:40 PM 07/10/2024    2:52 PM  BMP  Glucose 70 - 99 mg/dL 895  81  87   BUN 6 - 20 mg/dL 20  21  17    Creatinine 0.44 - 1.00 mg/dL 8.95  9.29  9.03   Sodium 135 - 145 mmol/L 137  140  140   Potassium 3.5 - 5.1 mmol/L 4.2  3.8  4.1   Chloride 98 - 111 mmol/L 103  106  104   CO2 22 - 32 mmol/L 25  27  25    Calcium  8.9 - 10.3 mg/dL 8.6  8.5  9.1    .  Family Communication: None at bedside. Patient verbalized understanding of the plan and had no questions.   Disposition: Status is: Inpatient Remains inpatient appropriate because: Continued monitoring  after breakthrough seizures   Planned Discharge Destination: Home    Time spent: 35 minutes  Author: Alban Pepper, MD 07/21/2024 5:07 PM  For on call review [email or web address].

## 2024-07-21 NOTE — Progress Notes (Signed)
 Patient removed her one IV but IV meds were completed. Provider made aware. IV team already consulted earlier and unsuccessful.

## 2024-07-21 NOTE — Progress Notes (Signed)
   07/21/24 9662  Assess: MEWS Score  Temp 97.9 F (36.6 C)  BP (!) 99/52  MAP (mmHg) 65  Pulse Rate 83  ECG Heart Rate 83  Resp 18  SpO2 99 %  O2 Device Room Air  Assess: MEWS Score  MEWS Temp 0  MEWS Systolic 1  MEWS Pulse 0  MEWS RR 0  MEWS LOC 1  MEWS Score 2  MEWS Score Color Yellow  Assess: if the MEWS score is Yellow or Red  Were vital signs accurate and taken at a resting state? Yes  Does the patient meet 2 or more of the SIRS criteria? No  MEWS guidelines implemented  Yes, yellow  Treat  MEWS Interventions Considered administering scheduled or prn medications/treatments as ordered  Take Vital Signs  Increase Vital Sign Frequency  Yellow: Q2hr x1, continue Q4hrs until patient remains green for 12hrs  Escalate  MEWS: Escalate Yellow: Discuss with charge nurse and consider notifying provider and/or RRT  Notify: Charge Nurse/RN  Name of Charge Nurse/RN Notified Elenor, RN  Assess: SIRS CRITERIA  SIRS Temperature  0  SIRS Respirations  0  SIRS Pulse 0  SIRS WBC 0  SIRS Score Sum  0

## 2024-07-21 NOTE — Consult Note (Signed)
 [facility] Psychiatric Consult Initial  Patient Name: .Kelly [name]  MRN: [id number]  DOB: [birth year]  Consult Order details:  Orders (From admission, onward)     Start     Ordered   07/20/24 1527  IP CONSULT TO PSYCHIATRY       Ordering Provider: Laurita Cort DASEN, MD  Provider:  (Not yet assigned)  Question Answer Comment  Location [facility]   Reason for Consult? Hx of schizo has keeping coming back for seizure medication noncompliance and breakthrough seizure, wonder if she needs psy meds adjusted.      07/20/24 1527             Mode of Visit: In person    Psychiatry Consult Evaluation  Service Date: July 21, 2024 LOS:  LOS: 0 days  Chief Complaint Seizures, history of schizoaffective disorder  Primary Psychiatric Diagnoses  Schizoaffective disorder    Assessment  Kelly [name] is a 58 y.o. female admitted: Medically Patient with medical history significant of seizure disorder, HTN, CAD, anxiety/depression, schizoaffective disorder, presented with altered mentation. Patient was recently hospitalized for seizure and altered mentation secondary to medical event with antiseizure medications, on discharge she was discharged to nursing home.  2 days ago however patient signed herself out of nursing home and brought back home.  And this morning, patient called her neighbor stating that she did not feel well.  Neighbor came to her home to see her and found patient  not at her baseline with a swollen lip and called EMS. Psychiatry was consulted for safety evaluation.   On assessment, patient denies SI/HI/plan. She denies current hallucinations, though endorses visual hallucinations at home. She reports compliance with her home medications. There is no psychiatric condition contributing to her seizures. There is no indication for inpatient psychiatric admission at this time.    Diagnoses:  Active Hospital problems: Principal Problem:    Seizure [facility])    Plan   ## Psychiatric Medication Recommendations:  Continue home medications   ## Medical Decision Making Capacity: Not specifically addressed in this encounter  ## Further Work-up:   -- most recent EKG on 07/20/24 had QtC of 433 -- Pertinent labwork reviewed earlier this admission includes: CMP, CBC with differential, glucose, Valproic  acid level    ## Disposition:-- There are no psychiatric contraindications to discharge at this time  ## Behavioral / Environmental: -Utilize compassion and acknowledge the patient's experiences while setting clear and realistic expectations for care.    ## Safety and Observation Level:  - Based on my clinical evaluation, I estimate the patient to be at low risk of self harm in the current setting. - At this time, we recommend  routine. This decision is based on my review of the chart including patient's history and current presentation, interview of the patient, mental status examination, and consideration of suicide risk including evaluating suicidal ideation, plan, intent, suicidal or self-harm behaviors, risk factors, and protective factors. This judgment is based on our ability to directly address suicide risk, implement suicide prevention strategies, and develop a safety plan while the patient is in the clinical setting. Please contact our team if there is a concern that risk level has changed.  CSSR Risk Category:C-SSRS RISK CATEGORY: No Risk  Suicide Risk Assessment: Patient has following modifiable risk factors for suicide: active mental illness (to encompass adhd, tbi, mania, psychosis, trauma reaction), which we are addressing by continuing home medications and advising continued outpatient follow up. Patient has following non-modifiable or  demographic risk factors for suicide: none identified.  Patient has the following protective factors against suicide: Access to outpatient mental health care, no history of suicide attempts,  and no history of NSSIB  Thank you for this consult request. Recommendations have been communicated to the primary team.  We will sign off at this time.   Kelly Juma LITTIE Lukes, PA-C       History of Present Illness  Relevant Aspects of [facility]   Patient Report:  Patient with history of schizoaffective disorder denies current psychiatric complaints. She denies persistent depressed mood or anxiety, though does report history of depression and past suicide attempt when she was in her 28's. She denies current SI/HI/plan. She endorses visual hallucinations while at home, stating she sees movement in her peripheral vision when she turns her head and sees grass in her home. She denies current visual hallucinations. She denies auditory hallucinations. She denies feelings of guilt, hopelessness, or worthlessness. She denies nightmares, flashbacks, or hypervigilance. She is sleeping well, 8-9 hours per night. She denies changes in appetite. She reports compliance with home medications and notes some frustration at being asked this by multiple providers. She denies alcohol or drug use. She denies tobacco use or vaping. She denies acute psychiatric concerns.   Psych ROS:  Depression: Admits history of depression  Anxiety:  Denies  Mania (lifetime and current): Denies  Psychosis: (lifetime and current): Admits history of visual hallucinations    Psychiatric and Social History  Psychiatric History:  Information collected from the patient and chart review.   Prev Dx/Sx: Depression  Current Psych Provider: Denies  Home Meds (current): Unknown  Previous Med Trials: Unknown  Therapy: Denies   Prior Psych Hospitalization: Unknown   Prior Self Harm: Denies  Prior Violence: Denies   Family Psych History: Unknown  Family Hx suicide: Unknown   Social History:   Educational Hx: graduated high school  Occupational Hx: on Therapist, sports Hx: Denies  Living Situation: Lives alone, has home  health aide that will be starting to visit per patient  Spiritual Hx: Unknown  Access to weapons/lethal means: Denies    Substance History Alcohol: Denies current use  Last Drink 2 years ago Tobacco: Denies  Illicit drugs: Denies  Prescription drug abuse: Denies  Rehab hx: Unknown   Exam Findings  Physical Exam: Reviewed and agree with the physical exam findings conducted by the medical provider.  Vital Signs:  Temp:  [97.7 F (36.5 C)-98.7 F (37.1 C)] 97.7 F (36.5 C) (09/05 0749) Pulse Rate:  [77-97] 86 (09/05 0749) Resp:  [14-20] 18 (09/05 0537) BP: (99-144)/(45-70) 134/70 (09/05 0749) SpO2:  [95 %-100 %] 96 % (09/05 0749) Weight:  [97.5 kg] 97.5 kg (09/04 1247) Blood pressure 134/70, pulse 86, temperature 97.7 F (36.5 C), resp. rate 18, height 5' 6 (1.676 m), weight 97.5 kg, SpO2 96%. Body mass index is 34.69 kg/m.    Mental Status Exam: General Appearance: Casual  Orientation:  Other:  Oriented to person, place, situation; gave correct month but incorrect year   Memory:  Immediate;   Fair Recent;   Fair Remote;   Fair  Concentration:  Concentration: Poor and Attention Span: Fair  Recall:  Fair  Attention  Fair  Eye Contact:  Fair  Speech:  Garbled and Slow  Language:  Fair  Volume:  Normal  Mood: calm   Affect:  Appropriate  Thought Process:  Coherent  Thought Content:  WDL  Suicidal Thoughts:  No  Homicidal Thoughts:  No  Judgement:  Fair  Insight:  Fair  Psychomotor Activity:  Normal  Akathisia:  No  Fund of Knowledge:  Fair      Assets:  Manufacturing systems engineer Housing  Cognition:  WNL  ADL's:  Intact  AIMS (if indicated):        Other History   These have been pulled in through the EMR, reviewed, and updated if appropriate.  Family History:  The patient's family history includes Arthritis in her mother; Breast cancer in her mother; Diabetes in her maternal aunt; Other in her father.  Medical History: Past Medical History:  Diagnosis Date    Acid reflux    Anxiety    Depression    Hypercholesteremia    Hypertension    Myocardial infarction (HCC)    Schizophrenia (HCC)    Seizures (HCC)    Sleep apnea     Surgical History: Past Surgical History:  Procedure Laterality Date   CARDIAC CATHETERIZATION     CESAREAN SECTION     DILATION AND CURETTAGE OF UTERUS     FOOT SURGERY     TONSILLECTOMY       Medications:   Current Facility-Administered Medications:    0.9 %  sodium chloride  infusion, 75 mL/hr, Intravenous, Continuous, Laurita Manor T, MD, Stopped at 07/21/24 0008   acetaminophen  (TYLENOL ) tablet 650 mg, 650 mg, Oral, Q6H PRN, Laurita Manor DASEN, MD   albuterol  (PROVENTIL ) (2.5 MG/3ML) 0.083% nebulizer solution 2.5 mg, 2.5 mg, Nebulization, Q6H PRN, Haynes Annabella SAILOR, RPH   atorvastatin  (LIPITOR) tablet 40 mg, 40 mg, Oral, QHS, Zhang, Ping T, MD   divalproex  (DEPAKOTE ) DR tablet 500 mg, 500 mg, Oral, BID, Franchot Novel, MD, 500 mg at 07/21/24 1015   enoxaparin  (LOVENOX ) injection 50 mg, 0.5 mg/kg, Subcutaneous, Q24H, Hallaji, Sheema M, RPH, 50 mg at 07/20/24 2202   gabapentin  (NEURONTIN ) capsule 300 mg, 300 mg, Oral, q AM, Laurita Manor T, MD, 300 mg at 07/21/24 1012   gabapentin  (NEURONTIN ) capsule 900 mg, 900 mg, Oral, QHS, Zhang, Ping T, MD, 900 mg at 07/21/24 0020   hydrALAZINE  (APRESOLINE ) injection 5 mg, 5 mg, Intravenous, Q6H PRN, Laurita Manor T, MD   lacosamide  (VIMPAT ) tablet 200 mg, 200 mg, Oral, BID, Franchot Novel, MD, 200 mg at 07/21/24 1008   lamoTRIgine  (LAMICTAL ) tablet 200 mg, 200 mg, Oral, BID, Zhang, Ping T, MD, 200 mg at 07/21/24 1008   midazolam  PF (VERSED ) injection 10 mg, 10 mg, Intramuscular, Q5 Min x 2 PRN, Laurita Manor T, MD   montelukast  (SINGULAIR ) tablet 10 mg, 10 mg, Oral, QHS, Zhang, Ping T, MD, 10 mg at 07/21/24 0020   nortriptyline  (PAMELOR ) capsule 50 mg, 50 mg, Oral, QHS, Zhang, Ping T, MD, 50 mg at 07/21/24 0136   ondansetron  (ZOFRAN ) injection 4 mg, 4 mg, Intravenous, Q6H  PRN, Laurita Manor DASEN, MD   Oral care mouth rinse, 15 mL, Mouth Rinse, PRN, Franchot Novel, MD   pantoprazole  (PROTONIX ) EC tablet 40 mg, 40 mg, Oral, Daily, Zhang, Ping T, MD, 40 mg at 07/21/24 1008   polyethylene glycol (MIRALAX  / GLYCOLAX ) packet 17 g, 17 g, Oral, Daily, Laurita Manor T, MD, 17 g at 07/21/24 1008   risperiDONE  (RISPERDAL ) tablet 2 mg, 2 mg, Oral, QHS, Zhang, Ping T, MD   traMADol  (ULTRAM ) tablet 50 mg, 50 mg, Oral, Q6H PRN, Laurita Manor DASEN, MD  Allergies: Allergies  Allergen Reactions   Percocet [Oxycodone -Acetaminophen ] Nausea And Vomiting    Kahlani Graber LITTIE Lukes, PA-C

## 2024-07-22 DIAGNOSIS — R569 Unspecified convulsions: Secondary | ICD-10-CM | POA: Diagnosis not present

## 2024-07-22 LAB — CBC WITH DIFFERENTIAL/PLATELET
Abs Immature Granulocytes: 0.05 K/uL (ref 0.00–0.07)
Basophils Absolute: 0 K/uL (ref 0.0–0.1)
Basophils Relative: 0 %
Eosinophils Absolute: 0.3 K/uL (ref 0.0–0.5)
Eosinophils Relative: 3 %
HCT: 26.9 % — ABNORMAL LOW (ref 36.0–46.0)
Hemoglobin: 8.6 g/dL — ABNORMAL LOW (ref 12.0–15.0)
Immature Granulocytes: 1 %
Lymphocytes Relative: 49 %
Lymphs Abs: 4.9 K/uL — ABNORMAL HIGH (ref 0.7–4.0)
MCH: 27.6 pg (ref 26.0–34.0)
MCHC: 32 g/dL (ref 30.0–36.0)
MCV: 86.2 fL (ref 80.0–100.0)
Monocytes Absolute: 0.7 K/uL (ref 0.1–1.0)
Monocytes Relative: 7 %
Neutro Abs: 3.9 K/uL (ref 1.7–7.7)
Neutrophils Relative %: 40 %
Platelets: 254 K/uL (ref 150–400)
RBC: 3.12 MIL/uL — ABNORMAL LOW (ref 3.87–5.11)
RDW: 17.4 % — ABNORMAL HIGH (ref 11.5–15.5)
WBC: 9.8 K/uL (ref 4.0–10.5)
nRBC: 0.4 % — ABNORMAL HIGH (ref 0.0–0.2)

## 2024-07-22 LAB — URINALYSIS, COMPLETE (UACMP) WITH MICROSCOPIC
Bacteria, UA: NONE SEEN
Bilirubin Urine: NEGATIVE
Glucose, UA: NEGATIVE mg/dL
Hgb urine dipstick: NEGATIVE
Ketones, ur: NEGATIVE mg/dL
Leukocytes,Ua: NEGATIVE
Nitrite: NEGATIVE
Protein, ur: NEGATIVE mg/dL
RBC / HPF: 0 RBC/hpf (ref 0–5)
Specific Gravity, Urine: 1.018 (ref 1.005–1.030)
pH: 5 (ref 5.0–8.0)

## 2024-07-22 LAB — URINE DRUG SCREEN, QUALITATIVE (ARMC ONLY)
Amphetamines, Ur Screen: NOT DETECTED
Barbiturates, Ur Screen: NOT DETECTED
Benzodiazepine, Ur Scrn: NOT DETECTED
Cannabinoid 50 Ng, Ur ~~LOC~~: POSITIVE — AB
Cocaine Metabolite,Ur ~~LOC~~: NOT DETECTED
MDMA (Ecstasy)Ur Screen: NOT DETECTED
Methadone Scn, Ur: NOT DETECTED
Opiate, Ur Screen: NOT DETECTED
Phencyclidine (PCP) Ur S: NOT DETECTED
Tricyclic, Ur Screen: POSITIVE — AB

## 2024-07-22 LAB — IRON AND TIBC
Iron: 43 ug/dL (ref 28–170)
Saturation Ratios: 15 % (ref 10.4–31.8)
TIBC: 287 ug/dL (ref 250–450)
UIBC: 244 ug/dL

## 2024-07-22 LAB — BASIC METABOLIC PANEL WITH GFR
Anion gap: 7 (ref 5–15)
BUN: 28 mg/dL — ABNORMAL HIGH (ref 6–20)
CO2: 28 mmol/L (ref 22–32)
Calcium: 8.5 mg/dL — ABNORMAL LOW (ref 8.9–10.3)
Chloride: 104 mmol/L (ref 98–111)
Creatinine, Ser: 1.03 mg/dL — ABNORMAL HIGH (ref 0.44–1.00)
GFR, Estimated: >60 mL/min (ref >60)
Glucose, Bld: 77 mg/dL (ref 70–99)
Potassium: 4.6 mmol/L (ref 3.5–5.1)
Sodium: 139 mmol/L (ref 135–145)

## 2024-07-22 LAB — MAGNESIUM: Magnesium: 2.5 mg/dL — ABNORMAL HIGH (ref 1.7–2.4)

## 2024-07-22 LAB — FERRITIN: Ferritin: 42 ng/mL (ref 11–307)

## 2024-07-22 LAB — FOLATE: Folate: 10.4 ng/mL (ref >5.9)

## 2024-07-22 LAB — GLUCOSE, CAPILLARY
Glucose-Capillary: 114 mg/dL — ABNORMAL HIGH (ref 70–99)
Glucose-Capillary: 119 mg/dL — ABNORMAL HIGH (ref 70–99)
Glucose-Capillary: 122 mg/dL — ABNORMAL HIGH (ref 70–99)
Glucose-Capillary: 190 mg/dL — ABNORMAL HIGH (ref 70–99)
Glucose-Capillary: 80 mg/dL (ref 70–99)

## 2024-07-22 MED ORDER — AMLODIPINE BESYLATE 5 MG PO TABS
5.0000 mg | ORAL_TABLET | Freq: Every day | ORAL | Status: DC
  Administered 2024-07-23 – 2024-07-27 (×5): 5 mg via ORAL
  Filled 2024-07-22 (×5): qty 1

## 2024-07-22 MED ORDER — FERROUS SULFATE 325 (65 FE) MG PO TABS
325.0000 mg | ORAL_TABLET | Freq: Every day | ORAL | Status: DC
  Administered 2024-07-23 – 2024-07-27 (×5): 325 mg via ORAL
  Filled 2024-07-22 (×5): qty 1

## 2024-07-22 MED ORDER — CLONIDINE HCL 0.1 MG PO TABS
0.1000 mg | ORAL_TABLET | Freq: Every day | ORAL | Status: DC
  Administered 2024-07-23 – 2024-07-27 (×5): 0.1 mg via ORAL
  Filled 2024-07-22 (×5): qty 1

## 2024-07-22 NOTE — Plan of Care (Signed)

## 2024-07-22 NOTE — TOC Initial Note (Signed)
 Transition of Care [facility]) - Initial/Assessment Note    Patient Details  Name: Kelly [name] MRN: [id number] Date of Birth: 08-23-1966  Transition of Care [facility]) CM/SW Contact:    Sharnee Douglass E Joelene Barriere, LCSW Phone Number: 07/22/2024, 11:01 AM  Clinical Narrative:                 CSW spoke with patient for readmission risk assessment.  Patient lives alone. Patient recently went to [employer] [facility] [employer] Patient states she is followed by the ACT team and they provide support to her at home and assist her with transportation. Patient states she does not have DME at home. Patient states she will get the number for the ACT team so that ICM can reach out to them if needed.  Expected Discharge Plan: Home/Self Care Barriers to Discharge: Continued Medical Work up   Patient Goals and CMS Choice   CMS Medicare.gov Compare Post Acute Care list provided to:: Patient Choice offered to / list presented to : Patient      Expected Discharge Plan and Services       Living arrangements for the past 2 months: Single Family Home                                      Prior Living Arrangements/Services Living arrangements for the past 2 months: Single Family Home Lives with:: Self Patient language and need for interpreter reviewed:: Yes Do you feel safe going back to the place where you live?: Yes      Need for Family Participation in Patient Care: Yes (Comment) Care giver support system in place?: No (comment)   Criminal Activity/Legal Involvement Pertinent to Current Situation/Hospitalization: No - Comment as needed  Activities of Daily Living      Permission Sought/Granted Permission sought to share information with : Facility Industrial/product designer granted to share information with : Yes, Verbal Permission Granted     Permission granted to share info w AGENCY: ACT team        Emotional Assessment         Alcohol / Substance Use: Not  Applicable Psych Involvement: No (comment)  Admission diagnosis:  Seizure (HCC) [R56.9] Contusion of face, initial encounter [S00.83XA] Patient Active Problem List   Diagnosis Date Noted   Seizure (HCC) 06/22/2024   AKI (acute kidney injury) (HCC) 06/22/2024   Suicidal ideations 04/11/2024   Mixed hyperlipidemia 12/20/2023   Class 2 obesity without serious comorbidity with body mass index (BMI) of 35.0 to 35.9 in adult 12/20/2023   Occasional tremors 12/20/2023   Mild intermittent asthma 12/20/2023   Migraine without status migrainosus, not intractable 12/20/2023   Frequent falls 12/20/2023   Medication regimen deficit 12/20/2023   Sleep apnea    Hypertension    Seizures (HCC)    Schizoaffective disorder (HCC) 04/08/2016   Acute anxiety 04/08/2016   PCP:  Wellington Curtis LABOR, FNP Pharmacy:   [facility] - [place], [place] - [address] [place] [address] Phone: [phone] Fax: [phone]  [id number] - [place], [place] - [address] [place] [address] Phone: [phone] Fax: [phone]     Social Drivers of Health (SDOH) Social History: SDOH Screenings   Food Insecurity: Patient Declined (06/22/2024)  Housing: Patient Declined (06/22/2024)  Transportation Needs: Patient Declined (06/22/2024)  Utilities: Patient Declined (06/22/2024)  Alcohol Screen: Low Risk  (01/12/2024)  Depression (PHQ2-9): High Risk (05/30/2024)  Financial Resource Strain: Low Risk  (01/12/2024)  Physical Activity: Inactive (01/12/2024)  Social Connections: Patient Declined (06/22/2024)  Stress: No Stress Concern Present (01/12/2024)  Tobacco Use: High Risk (07/10/2024)  Health Literacy: Adequate Health Literacy (01/12/2024)   SDOH Interventions:     Readmission Risk Interventions    07/22/2024   10:59 AM  Readmission Risk Prevention Plan  Transportation Screening Complete  PCP or Specialist Appt within 3-5 Days Complete   HRI or Home Care Consult Complete  Social Work Consult for Recovery Care Planning/Counseling Complete  Palliative Care Screening Not Applicable  Medication Review Oceanographer) Complete

## 2024-07-22 NOTE — Progress Notes (Signed)
 Progress Note   Patient: Kelly [name] FMW:[id number] DOB: 08/07/1966 DOA: 07/20/2024     1 DOS: the patient was seen and examined on 07/22/2024   Brief hospital course: Per H&P HPI  Kelly [name] is a 58 y.o. female with medical history significant of seizure disorder, HTN, CAD, anxiety/depression, schizoaffective disorder, presented with altered mentation.   Patient appears to be postictal and confused unable to provide any useful information.  All history obtained by discussion with ED staff and ED physician and review of chart.  Patient was recently hospitalized for seizure and altered mentation secondary to medical event with antiseizure medications, on discharge she was discharged to nursing home.  2 days ago however patient signed herself out of nursing home and brought back home.  And this morning, patient called her neighbor stating that she did not feel well.  Neighbor came to her home to see her and found patient  not at her baseline with a swollen lip and called EMS.  Assessment and Plan:  Recurrent seizure Likely due to nonadherence with her medications.  Low suspicion for infection Eeg without seizure activity UDS with cannabinoid but otherwise appropriate Valproic  acid level WNL appears to be drawn appropriately  Lamictal  pending   Patient medically stable, she needs assistance w/ obtaining medications. She gets medicatiosn via mail order system monthly. Her Gabapentin  was increased this hospitalization.   AKI Scr stable.  Continue to encourage PO intake.    Anxiety/depression H/o schizoaffective d/o. Has outpatient ACT team.  Continue risperdal  and seroquel    Normocytic anemia  Stable. No obvious bleeding noted.  TSAT 15%. Started PO iron.    HTN Resume clonidine  and amlodipine . Hold losartan  in setting of AKI. Likely resume on d/c   Class I Obesity  Complicates care       Subjective: No seizures noted overnight.  No new  complaints.  Physical Exam: Vitals:   07/22/24 0418 07/22/24 0723 07/22/24 1100 07/22/24 1527  BP: 117/68 134/79 (!) 157/60 125/81  Pulse: 83 81 88 87  Resp: 18     Temp: (!) 97.5 F (36.4 C) 97.9 F (36.6 C) 98.4 F (36.9 C) 98 F (36.7 C)  TempSrc:  Oral Oral Oral  SpO2: 97% 100% 100% 97%  Weight:      Height:        Physical Exam  Constitutional: In no distress.  Cardiovascular: Normal rate, regular rhythm. No lower extremity edema  Pulmonary: Non labored breathing on room air, no wheezing or rales.   Abdominal: Soft. Non distended and non tender Musculoskeletal: Normal range of motion.     Neurological: Alert and oriented to person, place, and time. Non focal  Skin: Skin is warm and dry.    Data Reviewed:     Latest Ref Rng & Units 07/22/2024    4:51 AM 07/21/2024   10:07 AM 07/20/2024    7:40 PM  BMP  Glucose 70 - 99 mg/dL 77  895  81   BUN 6 - 20 mg/dL 28  20  21    Creatinine 0.44 - 1.00 mg/dL 8.96  8.95  9.29   Sodium 135 - 145 mmol/L 139  137  140   Potassium 3.5 - 5.1 mmol/L 4.6  4.2  3.8   Chloride 98 - 111 mmol/L 104  103  106   CO2 22 - 32 mmol/L 28  25  27    Calcium  8.9 - 10.3 mg/dL 8.5  8.6  8.5    .  Family  Communication: None at bedside. Patient verbalized understanding of the plan and had no questions.   Disposition: Status is: Inpatient Remains inpatient appropriate because: Continued monitoring after breakthrough seizures   Planned Discharge Destination: Home    Time spent: 35 minutes  Author: Alban Pepper, MD 07/22/2024 5:22 PM  For on call review [email or web address].

## 2024-07-23 DIAGNOSIS — R569 Unspecified convulsions: Secondary | ICD-10-CM | POA: Diagnosis not present

## 2024-07-23 LAB — BASIC METABOLIC PANEL WITH GFR
Anion gap: 10 (ref 5–15)
Anion gap: 9 (ref 5–15)
BUN: 22 mg/dL — ABNORMAL HIGH (ref 6–20)
BUN: 21 mg/dL — ABNORMAL HIGH (ref 6–20)
CO2: 26 mmol/L (ref 22–32)
CO2: 29 mmol/L (ref 22–32)
Calcium: 8.8 mg/dL — ABNORMAL LOW (ref 8.9–10.3)
Calcium: 8.8 mg/dL — ABNORMAL LOW (ref 8.9–10.3)
Chloride: 104 mmol/L (ref 98–111)
Chloride: 101 mmol/L (ref 98–111)
Creatinine, Ser: 1.03 mg/dL — ABNORMAL HIGH (ref 0.44–1.00)
Creatinine, Ser: 1.12 mg/dL — ABNORMAL HIGH (ref 0.44–1.00)
GFR, Estimated: >60 mL/min (ref >60)
GFR, Estimated: 58 mL/min — ABNORMAL LOW (ref >60)
Glucose, Bld: 71 mg/dL (ref 70–99)
Glucose, Bld: 88 mg/dL (ref 70–99)
Potassium: 5.3 mmol/L — ABNORMAL HIGH (ref 3.5–5.1)
Potassium: 4.3 mmol/L (ref 3.5–5.1)
Sodium: 140 mmol/L (ref 135–145)
Sodium: 139 mmol/L (ref 135–145)

## 2024-07-23 MED ORDER — GABAPENTIN 300 MG PO CAPS
ORAL_CAPSULE | ORAL | 3 refills | Status: AC
Start: 2024-07-23 — End: ?
  Filled 2024-07-23: qty 150, 30d supply, fill #0

## 2024-07-23 MED ORDER — FERROUS SULFATE 325 (65 FE) MG PO TABS
325.0000 mg | ORAL_TABLET | Freq: Every day | ORAL | 3 refills | Status: AC
  Filled 2024-07-23: qty 30, 30d supply, fill #0

## 2024-07-23 NOTE — Progress Notes (Signed)
 Progress Note   Patient: Kelly [name] FMW:[id number] DOB: [birth year] DOA: 07/20/2024     2 DOS: the patient was seen and examined on 07/23/2024   Brief hospital course: Per H&P HPI  Kelly [name] is a 58 y.o. female with medical history significant of seizure disorder, HTN, CAD, anxiety/depression, schizoaffective disorder, presented with altered mentation.   Patient appears to be postictal and confused unable to provide any useful information.  All history obtained by discussion with ED staff and ED physician and review of chart.  Patient was recently hospitalized for seizure and altered mentation secondary to medical event with antiseizure medications, on discharge she was discharged to nursing home.  2 days ago however patient signed herself out of nursing home and brought back home.  And this morning, patient called her neighbor stating that she did not feel well.  Neighbor came to her home to see her and found patient  not at her baseline with a swollen lip and called EMS.  Assessment and Plan:  Recurrent seizure Likely due to nonadherence with her medications.  Low suspicion for infection Eeg without seizure activity UDS with cannabinoid but otherwise appropriate Valproic  acid level WNL appears to be drawn appropriately  Lamictal  pending   Patient medically stable, she needs assistance w/ obtaining medications. She gets medicatiosn via mail order system monthly. Her Gabapentin  was increased this hospitalization.   AKI Scr stable.  Continue to encourage PO intake.   Hyperkalemia Mild.  Will repeat BMP   Anxiety/depression H/o schizoaffective d/o. Has outpatient ACT team.  Continue risperdal  and seroquel    Normocytic anemia  Stable. No obvious bleeding noted.  TSAT 15%.  Continue p.o. iron   HTN Resumed clonidine  and amlodipine . Hold losartan  in setting of AKI and hyperkalemia. can Likely resume on d/c   Class I Obesity  Complicates care        Subjective: No new seizures overnight.  No new complaints.  Physical Exam: Vitals:   07/22/24 2030 07/23/24 0442 07/23/24 0852 07/23/24 1426  BP: (!) 141/60 107/61 (!) 141/64 138/76  Pulse: 91 76 85 89  Resp: 16 12 15 17   Temp: 98.2 F (36.8 C) 97.6 F (36.4 C) 97.8 F (36.6 C) 98.5 F (36.9 C)  TempSrc: Oral Oral    SpO2: 100% 99% 98% 98%  Weight:      Height:        Constitutional: In no distress.  Cardiovascular: Normal rate, regular rhythm. No lower extremity edema  Pulmonary: Non labored breathing on room air, no wheezing or rales.   Abdominal: Soft. Non distended and non tender Musculoskeletal: Normal range of motion.     Neurological: Alert and oriented to person, place, and time. Non focal  Skin: Skin is warm and dry.    Data Reviewed:     Latest Ref Rng & Units 07/23/2024    5:04 AM 07/22/2024    4:51 AM 07/21/2024   10:07 AM  BMP  Glucose 70 - 99 mg/dL 71  77  895   BUN 6 - 20 mg/dL 22  28  20    Creatinine 0.44 - 1.00 mg/dL 8.96  8.96  8.95   Sodium 135 - 145 mmol/L 140  139  137   Potassium 3.5 - 5.1 mmol/L 5.3  4.6  4.2   Chloride 98 - 111 mmol/L 104  104  103   CO2 22 - 32 mmol/L 26  28  25    Calcium  8.9 - 10.3 mg/dL 8.8  8.5  8.6    .  Family Communication: None at bedside. Patient verbalized understanding of the plan and had no questions.   Disposition: Status is: Inpatient Remains inpatient appropriate because: Continued monitoring after breakthrough seizures   Planned Discharge Destination: Home    Time spent: 35 minutes  Author: Alban Pepper, MD 07/23/2024 6:51 PM  For on call review [email or web address].

## 2024-07-23 NOTE — Plan of Care (Signed)

## 2024-07-24 ENCOUNTER — Other Ambulatory Visit: Payer: Self-pay | Admitting: Family Medicine

## 2024-07-24 ENCOUNTER — Other Ambulatory Visit: Payer: Self-pay

## 2024-07-24 DIAGNOSIS — R569 Unspecified convulsions: Secondary | ICD-10-CM | POA: Diagnosis not present

## 2024-07-24 LAB — BASIC METABOLIC PANEL WITH GFR
Anion gap: 8 (ref 5–15)
BUN: 24 mg/dL — ABNORMAL HIGH (ref 6–20)
CO2: 28 mmol/L (ref 22–32)
Calcium: 8.6 mg/dL — ABNORMAL LOW (ref 8.9–10.3)
Chloride: 104 mmol/L (ref 98–111)
Creatinine, Ser: 1.08 mg/dL — ABNORMAL HIGH (ref 0.44–1.00)
GFR, Estimated: >60 mL/min (ref >60)
Glucose, Bld: 77 mg/dL (ref 70–99)
Potassium: 4.7 mmol/L (ref 3.5–5.1)
Sodium: 140 mmol/L (ref 135–145)

## 2024-07-24 LAB — LAMOTRIGINE LEVEL: Lamotrigine Lvl: 19.3 ug/mL (ref 2.0–20.0)

## 2024-07-24 NOTE — Evaluation (Signed)
 Physical Therapy Evaluation Patient Details Name: Kelly [name] MRN: [id number] DOB: November 20, 1965 Today's Date: 07/24/2024  History of Present Illness  Kelly [name] is a 58 y.o. female with medical history significant of seizure disorder, HTN, CAD, anxiety/depression, schizoaffective disorder, presented with altered mentation.     Patient appears to be postictal and confused unable to provide any useful information.  All history obtained by discussion with ED staff and ED physician and review of chart.  Patient was recently hospitalized for seizure and altered mentation secondary to medical event with antiseizure medications, on discharge she was discharged to nursing home.  2 days ago however patient signed herself out of nursing home and brought back home.  And this morning, patient called her neighbor stating that she did not feel well.  Neighbor came to her home to see her and found patient  not at her baseline with a swollen lip and called EMS.  Clinical Impression  Patient admitted with the above. PTA, patient lives alone and using canes as needed per her report. Patient impulsive throughout session. Required min-modA to stand from EOB with significant posterior lean/bias requiring maxA to maintain balance and keep RW on ground. Stood at bedside x 4 in hopes to improve posterior bias with no improvement noted. Utilized visual and verbal cueing for anterior weight shift with poor follow through. Difficulty problem solving how to correct posterior lean despite cueing. Continues to demonstrate confusion with entire situation and poor insight into current deficits. Patient will benefit from skilled PT services during acute stay to address listed deficits. Patient will benefit from ongoing therapy at discharge to maximize functional independence and safety.         If plan is discharge home, recommend the following: A lot of help with walking and/or transfers;A lot of help with  bathing/dressing/bathroom;Assistance with cooking/housework;Direct supervision/assist for medications management;Direct supervision/assist for financial management;Assist for transportation;Help with stairs or ramp for entrance;Supervision due to cognitive status   Can travel by private vehicle   No    Equipment Recommendations Other (comment) (TBD at next venue of care)  Recommendations for Other Services       Functional Status Assessment Patient has had a recent decline in their functional status and demonstrates the ability to make significant improvements in function in a reasonable and predictable amount of time.     Precautions / Restrictions Precautions Precautions: Fall Recall of Precautions/Restrictions: Impaired Restrictions Weight Bearing Restrictions Per Provider Order: No      Mobility  Bed Mobility Overal bed mobility: Needs Assistance Bed Mobility: Supine to Sit, Sit to Supine     Supine to sit: Min assist Sit to supine: Contact guard assist        Transfers Overall transfer level: Needs assistance Equipment used: Rolling Malashia Kamaka (2 wheels) Transfers: Sit to/from Stand Sit to Stand: Mod assist, Min assist           General transfer comment: cues required for hand placement and visual target to weight shift forward in sitting prior to stand. Significant posterior bias in standing requiring maxA to maintain upright balance with patient unable to correct despite visual and verbal cueing    Ambulation/Gait                  Stairs            Wheelchair Mobility     Tilt Bed    Modified Rankin (Stroke Patients Only)       Balance Overall balance assessment: Needs  assistance Sitting-balance support: Feet supported Sitting balance-Leahy Scale: Fair Sitting balance - Comments: occasional minA for sitting balance due to posterior lean   Standing balance support: Bilateral upper extremity supported, During functional  activity Standing balance-Leahy Scale: Zero Standing balance comment: maxA to maintain standing due to significant posterior bias                             Pertinent Vitals/Pain Pain Assessment Pain Assessment: No/denies pain    Home Living Family/patient expects to be discharged to:: Private residence Living Arrangements: Alone Available Help at Discharge: Neighbor;Available PRN/intermittently Type of Home: Apartment Home Access: Level entry       Home Layout: One level Home Equipment: Cane - single point      Prior Function Prior Level of Function : Independent/Modified Independent;Patient poor historian/Family not available             Mobility Comments: has SPC she uses at baseline ADLs Comments: Pt reports her neighbor takes her to the grocery store or helps her if she calls her and needs something.     Extremity/Trunk Assessment   Upper Extremity Assessment Upper Extremity Assessment: Generalized weakness    Lower Extremity Assessment Lower Extremity Assessment: Generalized weakness    Cervical / Trunk Assessment Cervical / Trunk Assessment: Normal  Communication   Communication Communication: No apparent difficulties    Cognition Arousal: Alert Behavior During Therapy: Impulsive   PT - Cognitive impairments: No family/caregiver present to determine baseline                       PT - Cognition Comments: poor safety awareness and insight into deficits         Cueing Cueing Techniques: Tactile cues, Visual cues     General Comments      Exercises     Assessment/Plan    PT Assessment Patient needs continued PT services  PT Problem List Decreased strength;Decreased activity tolerance;Decreased balance;Decreased mobility;Decreased coordination;Decreased knowledge of use of DME;Decreased safety awareness;Decreased knowledge of precautions;Cardiopulmonary status limiting activity;Decreased cognition       PT Treatment  Interventions DME instruction;Gait training;Functional mobility training;Therapeutic exercise;Balance training;Neuromuscular re-education;Therapeutic activities;Patient/family education    PT Goals (Current goals can be found in the Care Plan section)  Acute Rehab PT Goals Patient Stated Goal: to go home PT Goal Formulation: With patient Time For Goal Achievement: 08/07/24 Potential to Achieve Goals: Fair    Frequency Min 2X/week     Co-evaluation               AM-PAC PT 6 Clicks Mobility  Outcome Measure Help needed turning from your back to your side while in a flat bed without using bedrails?: A Little Help needed moving from lying on your back to sitting on the side of a flat bed without using bedrails?: A Little Help needed moving to and from a bed to a chair (including a wheelchair)?: Total Help needed standing up from a chair using your arms (e.g., wheelchair or bedside chair)?: Total Help needed to walk in hospital room?: Total Help needed climbing 3-5 steps with a railing? : Total 6 Click Score: 10    End of Session Equipment Utilized During Treatment: Gait belt Activity Tolerance: Patient tolerated treatment well Patient left: in bed;with call bell/phone within reach;with bed alarm set Nurse Communication: Mobility status PT Visit Diagnosis: Unsteadiness on feet (R26.81);Muscle weakness (generalized) (M62.81);Other abnormalities of gait and mobility (R26.89);History  of falling (Z91.81)    Time: 8563-8498 PT Time Calculation (min) (ACUTE ONLY): 25 min   Charges:   PT Evaluation $PT Eval Moderate Complexity: 1 Mod PT Treatments $Therapeutic Activity: 8-22 mins PT General Charges $$ ACUTE PT VISIT: 1 Visit         Maryanne Finder, PT, DPT Physical Therapist - [facility]  [facility]   Liam Cammarata A Abraham Margulies 07/24/2024, 3:36 PM

## 2024-07-24 NOTE — Evaluation (Signed)
 Occupational Therapy Evaluation Patient Details Name: Kelly [name] MRN: [id number] DOB: [birth year] Today's Date: 07/24/2024   History of Present Illness   Kelly [name] is a 58 y.o. female with medical history significant of seizure disorder, HTN, CAD, anxiety/depression, schizoaffective disorder, presented with altered mentation.     Patient appears to be postictal and confused unable to provide any useful information.  All history obtained by discussion with ED staff and ED physician and review of chart.  Patient was recently hospitalized for seizure and altered mentation secondary to medical event with antiseizure medications, on discharge she was discharged to nursing home.  2 days ago however patient signed herself out of nursing home and brought back home.  And this morning, patient called her neighbor stating that she did not feel well.  Neighbor came to her home to see her and found patient  not at her baseline with a swollen lip and called EMS.     Clinical Impressions Patient presenting with decreased Ind in self care,balance, functional mobility/transfers, endurance, safety awareness.Patient reports being Ind at baseline and living at home alone with use of cane as needed. Pt is very verbose and impulsive at beginning of session. Difficult to give instructions for safety and pt needing increased time to follow commands. Pt stands with min A and use of RW and performs side steps with min A and then attempts steps away from bed with multiple knee buckles requiring max A to correct and return to bed. Pt then very quiet and upset. Pt seems to have some continues confusion as well.  Patient will benefit from acute OT to increase overall independence in the areas of ADLs, functional mobility, and safety awareness  in order to safely discharge.      If plan is discharge home, recommend the following:   A lot of help with walking and/or transfers;A lot of help with  bathing/dressing/bathroom;Assistance with cooking/housework;Supervision due to cognitive status     Functional Status Assessment   Patient has had a recent decline in their functional status and demonstrates the ability to make significant improvements in function in a reasonable and predictable amount of time.     Equipment Recommendations   Other (comment) (defer to next venue of care)      Precautions/Restrictions   Precautions Precautions: Fall     Mobility Bed Mobility Overal bed mobility: Needs Assistance Bed Mobility: Supine to Sit, Sit to Supine     Supine to sit: Min assist Sit to supine: Min assist        Transfers Overall transfer level: Needs assistance Equipment used: 1 person hand held assist Transfers: Sit to/from Stand, Bed to chair/wheelchair/BSC Sit to Stand: Min assist     Step pivot transfers: Mod assist            Balance Overall balance assessment: Needs assistance Sitting-balance support: Feet supported Sitting balance-Leahy Scale: Good     Standing balance support: During functional activity Standing balance-Leahy Scale: Poor                             ADL either performed or assessed with clinical judgement   ADL Overall ADL's : Needs assistance/impaired                         Toilet Transfer: Maximal assistance;Ambulation;BSC/3in1 Toilet Transfer Details (indicate cue type and reason): simulated  Vision Patient Visual Report: No change from baseline              Pertinent Vitals/Pain Pain Assessment Pain Assessment: No/denies pain     Extremity/Trunk Assessment Upper Extremity Assessment Upper Extremity Assessment: Generalized weakness   Lower Extremity Assessment Lower Extremity Assessment: Generalized weakness   Cervical / Trunk Assessment Cervical / Trunk Assessment: Normal   Communication Communication Communication: No apparent difficulties   Cognition  Arousal: Alert Behavior During Therapy: Impulsive Cognition: Cognition impaired         Attention impairment (select first level of impairment): Selective attention Executive functioning impairment (select all impairments): Reasoning, Problem solving                           Cueing  General Comments   Cueing Techniques: Tactile cues;Visual cues              Home Living Family/patient expects to be discharged to:: Private residence Living Arrangements: Alone Available Help at Discharge: Neighbor;Available PRN/intermittently Type of Home: Apartment Home Access: Level entry     Home Layout: One level     Bathroom Shower/Tub: Producer, television/film/video: Handicapped height Bathroom Accessibility: Yes   Home Equipment: Cane - single point          Prior Functioning/Environment Prior Level of Function : Independent/Modified Independent;Patient poor historian/Family not available             Mobility Comments: has SPC she uses at baseline ADLs Comments: Pt reports her neighbor takes her to the grocery store or helps her if she calls her and needs something.    OT Problem List: Impaired balance (sitting and/or standing);Decreased strength;Decreased cognition;Decreased activity tolerance;Pain;Decreased safety awareness;Decreased knowledge of use of DME or AE   OT Treatment/Interventions: Self-care/ADL training;Therapeutic exercise;Balance training;Energy conservation;Therapeutic activities;Patient/family education      OT Goals(Current goals can be found in the care plan section)   Acute Rehab OT Goals Patient Stated Goal: to go home OT Goal Formulation: With patient Time For Goal Achievement: 08/07/24 Potential to Achieve Goals: Fair ADL Goals Pt Will Perform Grooming: standing;with supervision Pt Will Perform Lower Body Dressing: with supervision;sit to/from stand Pt Will Transfer to Toilet: with supervision;ambulating Pt Will Perform  Toileting - Clothing Manipulation and hygiene: with supervision;sit to/from stand   OT Frequency:  Min 2X/week       AM-PAC OT 6 Clicks Daily Activity     Outcome Measure Help from another person eating meals?: None Help from another person taking care of personal grooming?: A Little Help from another person toileting, which includes using toliet, bedpan, or urinal?: A Little Help from another person bathing (including washing, rinsing, drying)?: A Little Help from another person to put on and taking off regular upper body clothing?: None Help from another person to put on and taking off regular lower body clothing?: A Little 6 Click Score: 20   End of Session Equipment Utilized During Treatment: Rollator (4 wheels) Nurse Communication: Mobility status  Activity Tolerance: Patient tolerated treatment well Patient left: in bed;with call bell/phone within reach;with bed alarm set  OT Visit Diagnosis: Unsteadiness on feet (R26.81);Repeated falls (R29.6);Muscle weakness (generalized) (M62.81)                Time: 8896-8875 OT Time Calculation (min): 21 min Charges:  OT General Charges $OT Visit: 1 Visit OT Evaluation $OT Eval Moderate Complexity: 1 Mod OT Treatments $Self Care/Home Management : 8-22 mins  Izetta Claude, MS, OTR/L , CBIS ascom [phone]  07/24/24, 1:18 PM

## 2024-07-24 NOTE — Progress Notes (Signed)
 Mobility Specialist - Progress Note   07/24/24 1227  Mobility  Activity Dangled on edge of bed;Stood at bedside;Pivoted/transferred to/from [facility]  Level of Assistance Contact guard assist, steadying assist  Assistive Device Front wheel walker  Distance Ambulated (ft) 4 ft  Activity Response Tolerated well  Mobility visit 1 Mobility  Mobility Specialist Start Time (ACUTE ONLY) 1210  Mobility Specialist Stop Time (ACUTE ONLY) 1226  Mobility Specialist Time Calculation (min) (ACUTE ONLY) 16 min   Pt semi fowler upon entry, utilizing RA. Pt transferred to/from the [facility] CGA, tolerated well--- MaxA peri care. Pt returned to bed, left in fowler position with alarm set and needs within reach.  America Silvan Mobility Specialist 07/24/24 12:29 PM

## 2024-07-24 NOTE — Progress Notes (Signed)
 Patient's discharge medications (iron and gabapentin ) were delivered to the bedside. Due to patient's current confusion and decreased safety awareness, they were taken from the bedside, sealed with a patient label, and placed in her patient-specific bin in the medication room. Geni, Charge RN aware.

## 2024-07-24 NOTE — Care Management Important Message (Signed)
 Important Message  Patient Details  Name: Kelly [name] MRN: [id number] Date of Birth: [birth year]   Important Message Given:  Yes - Medicare IM     Rojelio SHAUNNA Rattler 07/24/2024, 3:05 PM

## 2024-07-24 NOTE — Progress Notes (Addendum)
 Progress Note   Patient: Kelly [name] FMW:[id number] DOB: [birth year] DOA: 07/20/2024     3 DOS: the patient was seen and examined on 07/24/2024   Brief hospital course:  HPI from admission 9/4: Kelly [name] is a 58 y.o. female with medical history significant of seizure disorder, HTN, CAD, anxiety/depression, schizoaffective disorder, presented with altered mentation.   Patient appears to be postictal and confused unable to provide any useful information.  All history obtained by discussion with ED staff and ED physician and review of chart.  Patient was recently hospitalized for seizure and altered mentation secondary to medical event with antiseizure medications, on discharge she was discharged to nursing home.  2 days ago however patient signed herself out of nursing home and brought back home.  And this morning, patient called her neighbor stating that she did not feel well.  Neighbor came to her home to see her and found patient  not at her baseline with a swollen lip and called EMS.    Assessment and Plan:  Recurrent seizure Likely due to nonadherence with her medications.  Low suspicion for infection EEG without seizure activity UDS with cannabinoid but otherwise appropriate Valproic  acid and Lamictal  levels were normal. --Neurology consulted, appreciate recommendations --Continue Vimpat  200 mg BID, Lamictal  200 mg BID, Valproic  acid 500 mg BID --Gabapentin  was increased to 300 mg in AM, 300 mg in afternoon, 900 mg QHS --Patient's medicaitons have already been filled to bedside, RN placed in her bin to keep secure pending d/c.   AKI  - Cr 1.12 >> 11.08 today, baseline appears 0.7-0.9 --Continue to encourage PO intake --Monitor BMP --Losartan  on hold   Hyperkalemia - mild, resolved. --Monitor BMP --Losartan  on hold, resume when appropriate   Anxiety/depression H/o schizoaffective disorder Followed by outpatient ACT team.  --Continue risperdal  and  seroquel     Normocytic anemia  Stable. No evidence of bleeding --Continue p.o. iron   HTN --Continue clonidine  and amlodipine  --Losartan  held in setting of AKI and hyperkalemia. can Likely resume on d/c    Class I Obesity - Body mass index is 34.69 kg/m. Complicates overall care and prognosis.  Recommend lifestyle modifications including physical activity and diet for weight loss and overall long-term health.       Subjective: Pt awake sitting up in bed this AM.  She asks if she can go home today.  States she has not been allowed up to walk around yet since admission.  She denies any acute complaints.   Physical Exam: Vitals:   07/23/24 1426 07/23/24 2126 07/24/24 0348 07/24/24 0921  BP: 138/76 (!) 134/54 (!) 149/83 136/73  Pulse: 89 86 80 83  Resp: 17 18 18 16   Temp: 98.5 F (36.9 C) 98.3 F (36.8 C) 97.7 F (36.5 C) 98.6 F (37 C)  TempSrc:      SpO2: 98% 100% 100% 94%  Weight:      Height:       General exam: awake, alert, no acute distress HEENT: moist mucus membranes, hearing grossly normal  Respiratory system: CTAB, no wheezes, rales or rhonchi, normal respiratory effort. Cardiovascular system: normal S1/S2, RRR, no pedal edema.   Gastrointestinal system: soft, NT, ND, no HSM felt, +bowel sounds. Central nervous system: A&O x 3. no gross focal neurologic deficits, normal speech Extremities: moves all, no edema, normal tone Skin: dry, intact, normal temperature Psychiatry: normal mood, congruent affect   Data Reviewed:  Notable labs --  BUN 24 Cr 1.08 Ca 8.6  Family Communication:  none at bedside, will attempt to call as time allows this afternoon  Disposition: Status is: Inpatient Remains inpatient appropriate because: Awaiting PT/OT evaluations for d/c planning. Pt might need SNF for rehab   Planned Discharge Destination: [facility] vs SNF    Time spent: 42 minutes  Author: Burnard DELENA Cunning, DO 07/24/2024 1:53 PM  For on call review [email or web address].

## 2024-07-25 DIAGNOSIS — R569 Unspecified convulsions: Secondary | ICD-10-CM | POA: Diagnosis not present

## 2024-07-25 LAB — BASIC METABOLIC PANEL WITH GFR
Anion gap: 8 (ref 5–15)
BUN: 22 mg/dL — ABNORMAL HIGH (ref 6–20)
CO2: 27 mmol/L (ref 22–32)
Calcium: 8.6 mg/dL — ABNORMAL LOW (ref 8.9–10.3)
Chloride: 103 mmol/L (ref 98–111)
Creatinine, Ser: 0.96 mg/dL (ref 0.44–1.00)
GFR, Estimated: >60 mL/min (ref >60)
Glucose, Bld: 86 mg/dL (ref 70–99)
Potassium: 4.7 mmol/L (ref 3.5–5.1)
Sodium: 138 mmol/L (ref 135–145)

## 2024-07-25 NOTE — Progress Notes (Signed)
 Progress Note   Patient: Kelly [name] FMW:[id number] DOB: 06/18/1966 DOA: 07/20/2024     4 DOS: the patient was seen and examined on 07/25/2024   Brief hospital course:  HPI from admission 9/4: Kelly [name] is a 58 y.o. female with medical history significant of seizure disorder, HTN, CAD, anxiety/depression, schizoaffective disorder, presented with altered mentation.   Patient appears to be postictal and confused unable to provide any useful information.  All history obtained by discussion with ED staff and ED physician and review of chart.  Patient was recently hospitalized for seizure and altered mentation secondary to medical event with antiseizure medications, on discharge she was discharged to nursing home.  2 days ago however patient signed herself out of nursing home and brought back home.  And this morning, patient called her neighbor stating that she did not feel well.  Neighbor came to her home to see her and found patient  not at her baseline with a swollen lip and called EMS.    Assessment and Plan:  Recurrent seizure Likely due to nonadherence with her medications.  Low suspicion for infection EEG without seizure activity UDS with cannabinoid but otherwise appropriate Valproic  acid and Lamictal  levels were normal. --Neurology consulted, appreciate recommendations --Continue Vimpat  200 mg BID, Lamictal  200 mg BID, Valproic  acid 500 mg BID --Gabapentin  was increased to 300 mg in AM, 300 mg in afternoon, 900 mg QHS --Patient's medicaitons have already been filled to bedside, RN placed in her bin to keep secure pending d/c.   AKI  - RESOLVED Cr 1.12 >> 1.08 >> 0.96 today, baseline appears 0.7-0.9 --Continue to encourage PO intake --Monitor BMP --Losartan  on hold   Hyperkalemia - mild, resolved. --Monitor BMP --Losartan  on hold, resume when appropriate   Anxiety/depression H/o schizoaffective disorder Followed by outpatient ACT team.  --Continue  risperdal  and seroquel     Normocytic anemia  Stable. No evidence of bleeding --Continue p.o. iron   HTN - BP's overall controlled, single low reading overnight (?accurate) --Continue clonidine  and amlodipine  --Losartan  held in setting of AKI and hyperkalemia. can Likely resume on d/c vs increase amlodipine    Class I Obesity - Body mass index is 34.69 kg/m. Complicates overall care and prognosis.  Recommend lifestyle modifications including physical activity and diet for weight loss and overall long-term health.       Subjective: Pt awake sitting up in bed this AM.  She reports feeling well, denies any acute complaints.  Says she did better with PT/OT today.  Agreeable to rehab, but states she would prefer to go home if she does better walking tomorrow.   Physical Exam: Vitals:   07/25/24 0357 07/25/24 0742 07/25/24 0926 07/25/24 1345  BP: (!) 90/44 122/70 131/72 (!) 140/67  Pulse: 88 92  93  Resp: 16 18  18   Temp:  97.6 F (36.4 C)  98.4 F (36.9 C)  TempSrc:  Oral  Oral  SpO2: 97% 100%  98%  Weight:      Height:       General exam: awake, alert, no acute distress HEENT: upper lip swelling and abrasion stable, moist mucus membranes, hearing grossly normal  Respiratory system: on room air, normal respiratory effort. Cardiovascular system: RRR, no pedal edema.   Gastrointestinal system: soft, NT, ND Central nervous system: A&O x 3. no gross focal neurologic deficits, normal speech Extremities: moves all, no edema, normal tone Skin: dry, intact, normal temperature Psychiatry: normal mood, congruent affect   Data Reviewed:  Notable labs --  BUN 22 Ca 8.6   Family Communication: none at bedside, will attempt to call as time allows this afternoon  Disposition: Status is: Inpatient Remains inpatient appropriate because: need SNF for rehab   Planned Discharge Destination: SNF recommended    Time spent: 35 minutes  Author: Burnard DELENA Cunning, DO 07/25/2024 2:21  PM  For on call review [email or web address].

## 2024-07-25 NOTE — Plan of Care (Signed)

## 2024-07-25 NOTE — Progress Notes (Signed)
 Physical Therapy Treatment Patient Details Name: Kelly [name] MRN: [id number] DOB: 1966/05/19 Today's Date: 07/25/2024   History of Present Illness Kelly [name] is a 58 y.o. female with medical history significant of seizure disorder, HTN, CAD, anxiety/depression, schizoaffective disorder, presented with altered mentation.     Patient appears to be postictal and confused unable to provide any useful information.  All history obtained by discussion with ED staff and ED physician and review of chart.  Patient was recently hospitalized for seizure and altered mentation secondary to medical event with antiseizure medications, on discharge she was discharged to nursing home.  2 days ago however patient signed herself out of nursing home and brought back home.  And this morning, patient called her neighbor stating that she did not feel well.  Neighbor came to her home to see her and found patient  not at her baseline with a swollen lip and called EMS. (Simultaneous filing. User may not have seen previous data.)    PT Comments  Patient continues to be impulsive at times and easily distracted requiring cues for focusing and slowing down. Required supervision for bed mobility. Stood from EOB and recliner during session with CGA-minA with frequent cues for hand placement. Ambulated 10', 20', 20' with RW and min-modA + chair follow with assist to advance RW and balance as patient demonstrates knee buckling intermittently. Seated rest breaks provided to patient between bouts. Poor insight into deficits and poor safety awareness. Discharge plan remains appropriate.     If plan is discharge home, recommend the following: A lot of help with walking and/or transfers;A lot of help with bathing/dressing/bathroom;Assistance with cooking/housework;Direct supervision/assist for medications management;Direct supervision/assist for financial management;Assist for transportation;Help with stairs or ramp for  entrance;Supervision due to cognitive status   Can travel by private vehicle     No  Equipment Recommendations  Other (comment) (TBD)    Recommendations for Other Services       Precautions / Restrictions Precautions Precautions: Fall (Simultaneous filing. User may not have seen previous data.) Recall of Precautions/Restrictions: Impaired (Simultaneous filing. User may not have seen previous data.) Restrictions Weight Bearing Restrictions Per Provider Order: No     Mobility  Bed Mobility Overal bed mobility: Needs Assistance Bed Mobility: Supine to Sit, Sit to Supine     Supine to sit: Supervision Sit to supine: Supervision        Transfers Overall transfer level: Needs assistance Equipment used: Rolling [place] (2 wheels) Transfers: Sit to/from Stand Sit to Stand: Min assist, Contact guard assist           General transfer comment: cues for hand placement each time    Ambulation/Gait Ambulation/Gait assistance: Min assist, Mod assist (+ chair follow) Gait Distance (Feet): 10 Feet (+20', +20') Assistive device: Rolling Andie Mortimer (2 wheels) Gait Pattern/deviations: Step-through pattern, Decreased stride length Gait velocity: decreased     General Gait Details: assist for RW advancement and up to modA for balance as patient with intermittent mild knee buckling. Poor awareness and easily distracted.   Stairs             Wheelchair Mobility     Tilt Bed    Modified Rankin (Stroke Patients Only)       Balance Overall balance assessment: Needs assistance Sitting-balance support: Feet supported Sitting balance-Leahy Scale: Fair     Standing balance support: Bilateral upper extremity supported, During functional activity Standing balance-Leahy Scale: Poor  Communication Communication Communication: No apparent difficulties (Simultaneous filing. User may not have seen previous data.) Factors Affecting  Communication: Other (comment) (word finding difficulties at times)  Cognition Arousal: Alert (Simultaneous filing. User may not have seen previous data.) Behavior During Therapy: Impulsive (Simultaneous filing. User may not have seen previous data.)   PT - Cognitive impairments: No family/caregiver present to determine baseline                       PT - Cognition Comments: poor safety awareness and insight into deficits Following commands: Impaired (Simultaneous filing. User may not have seen previous data.) Following commands impaired: Follows one step commands inconsistently (and repetitive cueing  Simultaneous filing. User may not have seen previous data.)    Cueing Cueing Techniques: Tactile cues, Visual cues (Simultaneous filing. User may not have seen previous data.)  Exercises      General Comments        Pertinent Vitals/Pain Pain Assessment Pain Assessment: No/denies pain (Simultaneous filing. User may not have seen previous data.)    Home Living                          Prior Function            PT Goals (current goals can now be found in the care plan section) Acute Rehab PT Goals Patient Stated Goal: to go home PT Goal Formulation: With patient Time For Goal Achievement: 08/07/24 Potential to Achieve Goals: Fair Progress towards PT goals: Progressing toward goals    Frequency    Min 2X/week      PT Plan      Co-evaluation              AM-PAC PT 6 Clicks Mobility   Outcome Measure  Help needed turning from your back to your side while in a flat bed without using bedrails?: A Little Help needed moving from lying on your back to sitting on the side of a flat bed without using bedrails?: A Little Help needed moving to and from a bed to a chair (including a wheelchair)?: Total Help needed standing up from a chair using your arms (e.g., wheelchair or bedside chair)?: Total Help needed to walk in hospital room?: Total Help  needed climbing 3-5 steps with a railing? : Total 6 Click Score: 10    End of Session Equipment Utilized During Treatment: Gait belt Activity Tolerance: Patient tolerated treatment well Patient left: in bed;with call bell/phone within reach;with bed alarm set Nurse Communication: Mobility status PT Visit Diagnosis: Unsteadiness on feet (R26.81);Muscle weakness (generalized) (M62.81);Other abnormalities of gait and mobility (R26.89);History of falling (Z91.81)     Time: 8891-8871 PT Time Calculation (min) (ACUTE ONLY): 20 min  Charges:    $Therapeutic Activity: 8-22 mins PT General Charges $$ ACUTE PT VISIT: 1 Visit                     Maryanne Finder, PT, DPT Physical Therapist - [place]  [facility]    Jaxzen Vanhorn A Timisha Mondry 07/25/2024, 1:32 PM

## 2024-07-25 NOTE — Progress Notes (Signed)
 Occupational Therapy Treatment Patient Details Name: Kelly [name] MRN: [id number] DOB: 02/18/1966 Today's Date: 07/25/2024   History of present illness Kelly [name] is a 58 y.o. female with medical history significant of seizure disorder, HTN, CAD, anxiety/depression, schizoaffective disorder, presented with altered mentation.     Patient appears to be postictal and confused unable to provide any useful information.  All history obtained by discussion with ED staff and ED physician and review of chart.  Patient was recently hospitalized for seizure and altered mentation secondary to medical event with antiseizure medications, on discharge she was discharged to nursing home.  2 days ago however patient signed herself out of nursing home and brought back home.  And this morning, patient called her neighbor stating that she did not feel well.  Neighbor came to her home to see her and found patient  not at her baseline with a swollen lip and called EMS. (Simultaneous filing. User may not have seen previous data.)   OT comments  Upon entering the room, pt supine in bed and agreeable to OT intervention. Pt remembers this therapist from yesterday and plan for session discussed. Pt dons B socks with long sitting position in bed and set up to obtain needed items. Supervision for bed mobility and cues to remain seated on EOB until all equipment set up and therapist ready to assist her with safely standing from bed. Pt continues to be impulsive throughout session. Posterior bias notes in sitting and standing throughout with increased cuing to correct. Pt is distracted requiring more assistance and cues for safety during functional mobility. Pt has three bouts of ambulation with rest breaks 10',20', and 20' respectively with min A for ambulation and chair follow for safety with B knees buckling with fatigue. Pt with poor safety awareness as she asks about ambulation around entire nurses station. OT  assisted back to room and then bed at end of session. Bed alarm activated. Call bell and all needed items within reach.       If plan is discharge home, recommend the following:  A lot of help with walking and/or transfers;A lot of help with bathing/dressing/bathroom;Assistance with cooking/housework;Supervision due to cognitive status   Equipment Recommendations  Other (comment) (defer to next venue of care)           Mobility Bed Mobility Overal bed mobility: Needs Assistance Bed Mobility: Supine to Sit, Sit to Supine     Supine to sit: Supervision Sit to supine: Supervision        Transfers Overall transfer level: Needs assistance Equipment used: Rolling walker (2 wheels) Transfers: Sit to/from Stand Sit to Stand: Min assist                 Balance Overall balance assessment: Needs assistance Sitting-balance support: Feet supported Sitting balance-Leahy Scale: Fair Sitting balance - Comments: occasional minA for sitting balance due to posterior lean   Standing balance support: Bilateral upper extremity supported, During functional activity, Reliant on assistive device for balance Standing balance-Leahy Scale: Poor                             ADL either performed or assessed with clinical judgement   ADL Overall ADL's : Needs assistance/impaired                 Upper Body Dressing : Set up Upper Body Dressing Details (indicate cue type and reason): dons hospital gown like cardigan Lower  Body Dressing: Supervision/safety;Set up Lower Body Dressing Details (indicate cue type and reason): Pt sits in long sitting position and dons B socks on feet                    Extremity/Trunk Assessment Upper Extremity Assessment Upper Extremity Assessment: Generalized weakness   Lower Extremity Assessment Lower Extremity Assessment: Generalized weakness        Vision Patient Visual Report: No change from baseline            Communication Communication Communication: No apparent difficulties (Simultaneous filing. User may not have seen previous data.) Factors Affecting Communication: Other (comment) (word finding difficulties at times)   Cognition     Cognition: Cognition impaired     Awareness: Intellectual awareness impaired, Online awareness impaired     Executive functioning impairment (select all impairments): Reasoning, Problem solving OT - Cognition Comments: Pt is very distracted requiring mod-max multimodal cuing                            Frequency  Min 2X/week        Progress Toward Goals  OT Goals(current goals can now be found in the care plan section)  Progress towards OT goals: Progressing toward goals      AM-PAC OT 6 Clicks Daily Activity     Outcome Measure   Help from another person eating meals?: None Help from another person taking care of personal grooming?: A Little Help from another person toileting, which includes using toliet, bedpan, or urinal?: A Little Help from another person bathing (including washing, rinsing, drying)?: A Little Help from another person to put on and taking off regular upper body clothing?: A Little Help from another person to put on and taking off regular lower body clothing?: A Lot 6 Click Score: 18    End of Session Equipment Utilized During Treatment: Rollator (4 wheels)  OT Visit Diagnosis: Unsteadiness on feet (R26.81);Repeated falls (R29.6);Muscle weakness (generalized) (M62.81)   Activity Tolerance Patient tolerated treatment well   Patient Left in bed;with call bell/phone within reach;with bed alarm set   Nurse Communication Mobility status        Time: 8895-8871 OT Time Calculation (min): 24 min  Charges: OT General Charges $OT Visit: 1 Visit OT Treatments $Self Care/Home Management : 8-22 mins  Izetta Claude, MS, OTR/L , CBIS ascom [phone]  07/25/24, 1:35 PM

## 2024-07-26 DIAGNOSIS — R569 Unspecified convulsions: Secondary | ICD-10-CM | POA: Diagnosis not present

## 2024-07-26 LAB — CBC
HCT: 30 % — ABNORMAL LOW (ref 36.0–46.0)
Hemoglobin: 9.6 g/dL — ABNORMAL LOW (ref 12.0–15.0)
MCH: 27.8 pg (ref 26.0–34.0)
MCHC: 32 g/dL (ref 30.0–36.0)
MCV: 87 fL (ref 80.0–100.0)
Platelets: 300 K/uL (ref 150–400)
RBC: 3.45 MIL/uL — ABNORMAL LOW (ref 3.87–5.11)
RDW: 18.2 % — ABNORMAL HIGH (ref 11.5–15.5)
WBC: 12.5 K/uL — ABNORMAL HIGH (ref 4.0–10.5)
nRBC: 0.6 % — ABNORMAL HIGH (ref 0.0–0.2)

## 2024-07-26 NOTE — Plan of Care (Signed)

## 2024-07-26 NOTE — TOC Progression Note (Signed)
 Transition of Care [facility]) - Progression Note    Patient Details  Name: Kelly [name] MRN: [id number] Date of Birth: 06/13/1966  Transition of Care [facility] CM/SW Contact  Corean ONEIDA Haddock, RN Phone Number: 07/26/2024, 11:50 AM  Clinical Narrative:      Met with patient at bedside yesterday to discuss discission.  Current recs are for SNF.  Patient states that she will be returning home with home health services  Expected Discharge Plan: Home/Self Care Barriers to Discharge: Continued Medical Work up               Expected Discharge Plan and Services       Living arrangements for the past 2 months: Single Family Home                                       Social Drivers of Health (SDOH) Interventions SDOH Screenings   Food Insecurity: Patient Unable To Answer (07/23/2024)  Housing: Patient Unable To Answer (07/23/2024)  Transportation Needs: Patient Unable To Answer (07/23/2024)  Utilities: Patient Unable To Answer (07/23/2024)  Alcohol Screen: Low Risk  (01/12/2024)  Depression (PHQ2-9): High Risk (05/30/2024)  Financial Resource Strain: Low Risk  (01/12/2024)  Physical Activity: Inactive (01/12/2024)  Social Connections: Patient Declined (06/22/2024)  Stress: No Stress Concern Present (01/12/2024)  Tobacco Use: High Risk (07/10/2024)  Health Literacy: Adequate Health Literacy (01/12/2024)    Readmission Risk Interventions    07/22/2024   10:59 AM  Readmission Risk Prevention Plan  Transportation Screening Complete  PCP or Specialist Appt within 3-5 Days Complete  HRI or Home Care Consult Complete  Social Work Consult for Recovery Care Planning/Counseling Complete  Palliative Care Screening Not Applicable  Medication Review Oceanographer) Complete

## 2024-07-26 NOTE — Progress Notes (Signed)
 Progress Note    Kelly [name]  FMW:[id number] DOB: 14-Mar-1966  DOA: 07/20/2024 PCP: Wellington Curtis LABOR, FNP      Brief Narrative:    Medical records reviewed and are as summarized below:  Kelly [name] is a 58 y.o. female with medical history significant for seizure disorder, hypertension, CAD, anxiety, depression, schizoaffective disorder, recent discharge from the hospital to SNF on 06/28/2024 after hospitalization on 06/21/2024 for seizures.  She presented to the hospital with altered mental status.  She was postictal and confused in the ED and was unable to provide any adequate history.  Reportedly, she signed out from [facility] nursing home AGAINST MEDICAL ADVICE 2 days prior to admission.  She lives at home alone but has neighbors who check on her according to patient.  Apparently a neighbor had called EMS when patient shouted for help.  She was admitted to the hospital for breakthrough seizures.      Assessment/Plan:   Principal Problem:   Seizure (HCC)    Body mass index is 34.69 kg/m.   Breakthrough seizures in a patient with seizure disorder: Medical nonadherence suspected.  EEG without any epileptiform activity.  Valproic  acid and Lamictal  levels were normal. Continue Vimpat , Lamictal  and valproic  acid. Continue gabapentin  (dose increased from 300 mg a.m. and 900 mg nightly to 300 mg twice daily and 900 mg nightly.)   AKI: Resolved Hyperkalemia: Resolved   Altered mental status, acute metabolic encephalopathy: Continue supportive care   Schizoaffective disorder, anxiety, depression: Continue Risperdal  and Seroquel    General weakness: PT recommended discharge to SNF.  Patient reluctant to go to SNF at this time.   Comorbidities include hypertension, normocytic anemia    Diet Order             Diet regular Room service appropriate? Yes; Fluid consistency: Thin  Diet effective now                                   Consultants: Neurologist Psychiatrist  Procedures: None    Medications:    amLODipine   5 mg Oral Daily   atorvastatin   40 mg Oral QHS   cloNIDine   0.1 mg Oral Daily   divalproex   500 mg Oral BID   enoxaparin  (LOVENOX ) injection  0.5 mg/kg Subcutaneous Q24H   ferrous sulfate   325 mg Oral Q breakfast   gabapentin   300 mg Oral BID   gabapentin   900 mg Oral QHS   lacosamide   200 mg Oral BID   lamoTRIgine   200 mg Oral BID   montelukast   10 mg Oral QHS   nortriptyline   50 mg Oral QHS   pantoprazole   40 mg Oral Daily   polyethylene glycol  17 g Oral Daily   risperiDONE   2 mg Oral QHS   Continuous Infusions:   Anti-infectives (From admission, onward)    None              Family Communication/Anticipated D/C date and plan/Code Status   DVT prophylaxis:      Code Status: Full Code  Family Communication: None Disposition Plan: Plan to discharge home versus SNF   Status is: Inpatient Remains inpatient appropriate because: Altered mental status       Subjective:   Interval events noted.  She has no complaints.  Objective:    Vitals:   07/26/24 0455 07/26/24 0700 07/26/24 0914 07/26/24 1429  BP: 115/61 125/65 136/71  126/69  Pulse: 81 79  94  Resp: 16 14  20   Temp: (!) 97.4 F (36.3 C) 97.7 F (36.5 C)  98.2 F (36.8 C)  TempSrc: Oral Oral    SpO2: 99% 99%  100%  Weight:      Height:       No data found.   Intake/Output Summary (Last 24 hours) at 07/26/2024 1548 Last data filed at 07/25/2024 2103 Gross per 24 hour  Intake --  Output 600 ml  Net -600 ml   Filed Weights   07/20/24 1247  Weight: 97.5 kg    Exam:  GEN: NAD SKIN: Warm and dry EYES: No pallor or icterus ENT: MMM CV: RRR PULM: CTA B ABD: soft, ND, NT, +BS CNS: AAO x 2 (person and place), non focal EXT: No edema or tenderness        Data Reviewed:   I have personally reviewed following labs and imaging  studies:  Labs: Labs show the following:   Basic Metabolic Panel: Recent Labs  Lab 07/22/24 0451 07/23/24 0504 07/23/24 1951 07/24/24 0344 07/25/24 0348  NA 139 140 139 140 138  K 4.6 5.3* 4.3 4.7 4.7  CL 104 104 101 104 103  CO2 28 26 29 28 27   GLUCOSE 77 71 88 77 86  BUN 28* 22* 21* 24* 22*  CREATININE 1.03* 1.03* 1.12* 1.08* 0.96  CALCIUM  8.5* 8.8* 8.8* 8.6* 8.6*  MG 2.5*  --   --   --   --    GFR Estimated Creatinine Clearance: 78 mL/min (by C-G formula based on SCr of 0.96 mg/dL). Liver Function Tests: Recent Labs  Lab 07/20/24 1940  AST 14*  ALT 12  ALKPHOS 60  BILITOT 0.6  PROT 6.5  ALBUMIN 3.1*   No results for input(s): LIPASE, AMYLASE in the last 168 hours. No results for input(s): AMMONIA in the last 168 hours. Coagulation profile No results for input(s): INR, PROTIME in the last 168 hours.  CBC: Recent Labs  Lab 07/20/24 1940 07/21/24 1007 07/22/24 0451 07/26/24 0347  WBC 12.2* 10.7* 9.8 12.5*  NEUTROABS 5.9 6.1 3.9  --   HGB 8.8* 9.8* 8.6* 9.6*  HCT 27.4* 30.6* 26.9* 30.0*  MCV 86.4 86.4 86.2 87.0  PLT 253 257 254 300   Cardiac Enzymes: No results for input(s): CKTOTAL, CKMB, CKMBINDEX, TROPONINI in the last 168 hours. BNP (last 3 results) No results for input(s): PROBNP in the last 8760 hours. CBG: Recent Labs  Lab 07/22/24 0008 07/22/24 0418 07/22/24 0727 07/22/24 1101 07/22/24 1659  GLUCAP 119* 80 190* 122* 114*   D-Dimer: No results for input(s): DDIMER in the last 72 hours. Hgb A1c: No results for input(s): HGBA1C in the last 72 hours. Lipid Profile: No results for input(s): CHOL, HDL, LDLCALC, TRIG, CHOLHDL, LDLDIRECT in the last 72 hours. Thyroid  function studies: No results for input(s): TSH, T4TOTAL, T3FREE, THYROIDAB in the last 72 hours.  Invalid input(s): FREET3 Anemia work up: No results for input(s): VITAMINB12, FOLATE, FERRITIN, TIBC, IRON, RETICCTPCT  in the last 72 hours. Sepsis Labs: Recent Labs  Lab 07/20/24 1940 07/21/24 1007 07/22/24 0451 07/26/24 0347  WBC 12.2* 10.7* 9.8 12.5*    Microbiology No results found for this or any previous visit (from the past 240 hours).  Procedures and diagnostic studies:  No results found.             LOS: 5 days   Nahmir Zeidman  [facility]   Pager on [email or web address].  If 7PM-7AM, please contact night-coverage at [email or web address]     07/26/2024, 3:48 PM

## 2024-07-26 NOTE — Progress Notes (Signed)
 Mobility Specialist - Progress Note   07/26/24 1143  Mobility  Activity Ambulated with assistance;Dangled on edge of bed;Stood at bedside  Level of Assistance Moderate assist, patient does 50-74%  Assistive Device Front wheel walker (w/ chair follow)  Distance Ambulated (ft) 140 ft  Activity Response Tolerated well  Mobility visit 1 Mobility  Mobility Specialist Start Time (ACUTE ONLY) 1050  Mobility Specialist Stop Time (ACUTE ONLY) 1127  Mobility Specialist Time Calculation (min) (ACUTE ONLY) 37 min   Pt semi fowler upon entry, utilizing RA--- soiled. Pt required MaxA to doff undergarments. Pt dangled EOB, while MS completed peri care MaxA. Pt STS to RW CGA, stood at bedside while MS finished peri care and dons undergarments. Pt amb ~140 ft in the hallway MinA, requiring ModA at times d/t bilateral knee buckle. Pt sat in the recliner and wheeled into the room. Pt left seated in the recliner with alarm set and needs within reach.  America Silvan Mobility Specialist 07/26/24 11:55 AM

## 2024-07-26 NOTE — Progress Notes (Signed)
 Patient walking in the hall around nurses station with mobility tech.

## 2024-07-27 DIAGNOSIS — R569 Unspecified convulsions: Secondary | ICD-10-CM | POA: Diagnosis not present

## 2024-07-27 NOTE — Discharge Summary (Signed)
 Physician Discharge Summary   Patient: Kelly [name] MRN: [id number] DOB: June 08, 1966  Admit date:     07/20/2024  Discharge date: 07/27/24  Discharge Physician: AIDA CHO   PCP: Wellington Curtis LABOR, FNP   Recommendations at discharge:   Follow-up with PCP in 1 week  Discharge Diagnoses: Principal Problem:   Seizure [facility])  Resolved Problems:   * No resolved hospital problems. *  Hospital Course:   Kelly [name] is a 58 y.o. female with medical history significant for seizure disorder, hypertension, CAD, anxiety, depression, schizoaffective disorder, recent discharge from the hospital to SNF on 06/28/2024 after hospitalization on 06/21/2024 for seizures.  She presented to the hospital with altered mental status.  She was postictal and confused in the ED and was unable to provide any adequate history.  Reportedly, she signed out from [facility] nursing home AGAINST MEDICAL ADVICE 2 days prior to admission.  She lives at home alone but has neighbors who check on her according to patient.  Apparently a neighbor had called EMS when patient shouted for help.   She was admitted to the hospital for breakthrough seizures.   Assessment and Plan:   Breakthrough seizures in a patient with seizure disorder: Medical nonadherence suspected.  EEG without any epileptiform activity.  Valproic  acid and Lamictal  levels were normal. Continue Vimpat , Lamictal  and valproic  acid. Continue gabapentin  (dose increased from 300 mg a.m. and 900 mg nightly to 300 mg twice daily and 900 mg nightly.)     AKI: Resolved Hyperkalemia: Resolved     Altered mental status, acute metabolic encephalopathy: Resolved.  She is at her baseline.     Schizoaffective disorder, anxiety, depression: Continue Risperdal  and Seroquel      General weakness: Patient was able to ambulate with a walker around the nurses' station.  She insisted on going home rather than go to SNF.  She understands that she  may be at increased risk for falls.  She is insistent on going home.  Home health PT and OT have been ordered.  Rolling walker was prescribed. She said she has neighbors who check on her frequently and the neighbors are like family to her.    Comorbidities include hypertension, normocytic anemia   Her condition has improved and she is deemed stable for discharge to home today.  The importance of medical adherence was reiterated.       Consultants: Neurologist, psychiatrist Procedures performed: None  Disposition: Home health Diet recommendation:  Discharge Diet Orders (From admission, onward)     Start     Ordered   07/27/24 0000  Diet - low sodium heart healthy        07/27/24 1230           Cardiac diet DISCHARGE MEDICATION: Allergies as of 07/27/2024       Reactions   Percocet [oxycodone -acetaminophen ] Nausea And Vomiting        Medication List     STOP taking these medications    nitrofurantoin (macrocrystal-monohydrate) 100 MG capsule Commonly known as: MACROBID   traMADol  50 MG tablet Commonly known as: ULTRAM        TAKE these medications    amLODipine  5 MG tablet Commonly known as: NORVASC  Take 1 tablet (5 mg total) by mouth daily.   atorvastatin  40 MG tablet Commonly known as: LIPITOR Take 1 tablet (40 mg total) by mouth at bedtime.   cloNIDine  0.1 MG tablet Commonly known as: CATAPRES  TAKE 1 TABLET BY MOUTH DAILY   divalproex  500 MG  DR tablet Commonly known as: DEPAKOTE  Take 500 mg by mouth 2 (two) times daily.   FeroSul 325 (65 Fe) MG tablet Generic drug: ferrous sulfate  Take 1 tablet (325 mg total) by mouth daily with breakfast.   gabapentin  300 MG capsule Commonly known as: NEURONTIN  Take 1 (one) 300 mg tablet at breakfast and 1 (one)  300mg  tablet at lunch time. Take 3 (three) 300mg  tablets before bedtime. What changed:  how much to take how to take this when to take this additional instructions Another medication with the  same name was removed. Continue taking this medication, and follow the directions you see here.   lamoTRIgine  200 MG tablet Commonly known as: LAMICTAL  Take 1 tablet (200 mg total) by mouth 2 (two) times daily.   losartan  25 MG tablet Commonly known as: COZAAR  Take 1 tablet (25 mg total) by mouth daily.   montelukast  10 MG tablet Commonly known as: SINGULAIR  Take 1 tablet (10 mg total) by mouth at bedtime.   nortriptyline  50 MG capsule Commonly known as: PAMELOR  Take 50 mg by mouth at bedtime.   omeprazole 40 MG capsule Commonly known as: PRILOSEC Take 40 mg by mouth daily.   polyethylene glycol 17 g packet Commonly known as: MIRALAX  / GLYCOLAX  Take 17 g by mouth daily.   risperiDONE  2 MG tablet Commonly known as: RISPERDAL  Take 2 mg by mouth at bedtime.   Ventolin  HFA 108 (90 Base) MCG/ACT inhaler Generic drug: albuterol  Inhale 2 puffs into the lungs every 6 (six) hours as needed.   Vimpat  200 MG Tabs tablet Generic drug: lacosamide  Take 200 mg by mouth 2 (two) times daily.               Durable Medical Equipment  (From admission, onward)           Start     Ordered   07/27/24 0000  For home use only DME Walker rolling       Question Answer Comment  Walker: With 5 Inch Wheels   Patient needs a walker to treat with the following condition General weakness      07/27/24 1230            Discharge Exam: Filed Weights   07/20/24 1247  Weight: 97.5 kg   GEN: NAD SKIN: Warm and dry EYES: No pallor or icterus ENT: MMM CV: RRR PULM: CTA B ABD: soft, ND, NT, +BS CNS: AAO x person, place, time and situation.  Non focal EXT: No edema or tenderness   Condition at discharge: good  The results of significant diagnostics from this hospitalization (including imaging, microbiology, ancillary and laboratory) are listed below for reference.   Imaging Studies: EEG adult Result Date: 07/21/2024 Michaela Aisha SQUIBB, MD     07/21/2024  2:57 PM  History: 58 year old female with a history of seizures being evaluated for the same EEG Duration: 31 minutes Sedation: None Patient State: Awake and asleep Technique: This EEG was acquired with electrodes placed according to the International 10-20 electrode system (including Fp1, Fp2, F3, F4, C3, C4, P3, P4, O1, O2, T3, T4, T5, T6, A1, A2, Fz, Cz, Pz). The following electrodes were missing or displaced: none. Background: The vast majority of the study is performed during sleep with symmetric appearing structures.  During a brief arousal, there is a posterior dominant rhythm of 8 Hz before she rapidly falls back asleep. Photic stimulation: Physiologic driving is not performed EEG Abnormalities: Normal sleep EEG Clinical Interpretation: This normal EEG is recorded  predominantly in the sleep state with only a brief arousal. There was no seizure or seizure predisposition recorded on this study. Please note that lack of epileptiform activity on EEG does not preclude the possibility of epilepsy. Aisha Seals, MD [facility] If 7pm- 7am, please page neurology on call as listed in AMION.  CT HEAD WO CONTRAST ( ) Result Date: 07/20/2024 EXAM: CT HEAD WITHOUT CONTRAST 07/20/2024 04:02:30 PM TECHNIQUE: CT of the head was performed without the administration of intravenous contrast. Automated exposure control, iterative reconstruction, and/or weight based adjustment of the mA/kV was utilized to reduce the radiation dose to as low as reasonably achievable. COMPARISON: 07/10/2024 CLINICAL HISTORY: Mental status change, unknown cause. Pt BIB [facility] from home due to new onset of altered mental status and possible fall. Pt was d/c from [facility] care 2 days ago for seizures. EMS reports neighbor called due to pt calling out for help. Neighbor states she is not at her baseline. Pt presents to the ED with a swollen lip. Pt had hx of falls and seizures. Has a diagnosis of cognitive communication deficit.  FINDINGS: BRAIN AND VENTRICLES: No acute hemorrhage. No evidence of acute infarct. No hydrocephalus. No extra-axial collection. No mass effect or midline shift. ORBITS: No acute abnormality. SINUSES: No acute abnormality. SOFT TISSUES AND SKULL: Enval decrease in size of hematoma within the right facial soft tissues overlying the zygomatic arch. The hematoma is only partially visualized on the current study. No skull fracture. IMPRESSION: 1. No acute intracranial abnormality. 2. Interval decrease in size of hematoma within the right facial soft tissues overlying the zygomatic arch, only partially visualized on the current study. Electronically signed by: Donnice Mania MD 07/20/2024 04:12 PM EDT RP Workstation: [id number]   DG Chest 1 View Result Date: 07/20/2024 CLINICAL DATA:  Altered mental status. EXAM: CHEST  1 VIEW COMPARISON:  Chest radiograph dated 07/10/2024. FINDINGS: There is mild cardiomegaly with mild vascular congestion. No focal consolidation, pleural effusion, pneumothorax. No acute osseous pathology. IMPRESSION: Mild cardiomegaly with mild vascular congestion. Electronically Signed   By: Vanetta Chou M.D.   On: 07/20/2024 16:06   CT Maxillofacial WO CM Result Date: 07/10/2024 CLINICAL DATA:  Status post seizure and subsequent fall. EXAM: CT MAXILLOFACIAL WITHOUT CONTRAST TECHNIQUE: Multidetector CT imaging of the maxillofacial structures was performed. Multiplanar CT image reconstructions were also generated. RADIATION DOSE REDUCTION: This exam was performed according to the departmental dose-optimization program which includes automated exposure control, adjustment of the mA and/or kV according to patient size and/or use of iterative reconstruction technique. COMPARISON:  None Available. FINDINGS: Osseous: No fracture or mandibular dislocation. No destructive process. Orbits: Negative. No traumatic or inflammatory finding. Sinuses: Clear. Soft tissues: There is moderate severity right-sided  facial soft tissue swelling with an associated 1.4 cm x 1.1 cm x 2.0 cm hematoma. Limited intracranial: No significant or unexpected finding. IMPRESSION: 1. Moderate severity right-sided facial soft tissue swelling with an associated 1.4 cm x 1.1 cm x 2.0 cm hematoma. 2. No acute facial bone fracture. Electronically Signed   By: Suzen Dials M.D.   On: 07/10/2024 14:52   CT Cervical Spine Wo Contrast Result Date: 07/10/2024 CLINICAL DATA:  Status post seizure and subsequent fall. EXAM: CT CERVICAL SPINE WITHOUT CONTRAST TECHNIQUE: Multidetector CT imaging of the cervical spine was performed without intravenous contrast. Multiplanar CT image reconstructions were also generated. RADIATION DOSE REDUCTION: This exam was performed according to the departmental dose-optimization program which includes automated exposure control, adjustment of the mA and/or kV  according to patient size and/or use of iterative reconstruction technique. COMPARISON:  June 22, 2024 FINDINGS: Alignment: There is mild reversal of the normal cervical spine lordosis. Skull base and vertebrae: No acute fracture. No primary bone lesion or focal pathologic process. Soft tissues and spinal canal: No prevertebral fluid or swelling. No visible canal hematoma. Disc levels: Mild to moderate severity endplate sclerosis, anterior osteophyte formation and posterior bony spurring are seen at the levels of C4-C5 and C5-C6. There is mild intervertebral disc space narrowing at the level of C4-C5 with mild to moderate severity intervertebral disc space narrowing seen at C5-C6. Mild, bilateral multilevel facet joint hypertrophy is noted. Upper chest: Negative. Other: None. IMPRESSION: 1. No acute fracture or subluxation in the cervical spine. 2. Mild to moderate severity degenerative changes at the levels of C4-C5 and C5-C6. Electronically Signed   By: Suzen Dials M.D.   On: 07/10/2024 14:49   CT Head Wo Contrast Result Date: 07/10/2024 CLINICAL  DATA:  Status post seizure and subsequent fall. EXAM: CT HEAD WITHOUT CONTRAST TECHNIQUE: Contiguous axial images were obtained from the base of the skull through the vertex without intravenous contrast. RADIATION DOSE REDUCTION: This exam was performed according to the departmental dose-optimization program which includes automated exposure control, adjustment of the mA and/or kV according to patient size and/or use of iterative reconstruction technique. COMPARISON:  None Available. FINDINGS: Brain: No evidence of acute infarction, hemorrhage, hydrocephalus, extra-axial collection or mass lesion/mass effect. Vascular: No hyperdense vessel or unexpected calcification. Skull: Normal. Negative for fracture or focal lesion. Sinuses/Orbits: No acute finding. Other: None. IMPRESSION: No acute intracranial pathology. Electronically Signed   By: Suzen Dials M.D.   On: 07/10/2024 14:47   DG Chest 2 View Result Date: 07/10/2024 CLINICAL DATA:  Status post seizure and subsequent fall. EXAM: CHEST - 2 VIEW COMPARISON:  June 22, 2024 FINDINGS: The heart size and mediastinal contours are within normal limits. Very mild atelectasis is suspected within the left lung base. No acute infiltrate, pleural effusion or pneumothorax is identified. The visualized skeletal structures are unremarkable. IMPRESSION: No active cardiopulmonary disease. Electronically Signed   By: Suzen Dials M.D.   On: 07/10/2024 14:43    Microbiology: Results for orders placed or performed during the hospital encounter of 03/09/23  SARS Coronavirus 2 by RT PCR (hospital order, performed in [facility] hospital lab) *cepheid single result test* Anterior Nasal Swab     Status: None   Collection Time: 03/09/23  1:49 AM   Specimen: Anterior Nasal Swab  Result Value Ref Range Status   SARS Coronavirus 2 by RT PCR NEGATIVE NEGATIVE Final    Comment: (NOTE) SARS-CoV-2 target nucleic acids are NOT DETECTED.  The SARS-CoV-2 RNA is generally  detectable in upper and lower respiratory specimens during the acute phase of infection. The lowest concentration of SARS-CoV-2 viral copies this assay can detect is 250 copies / mL. A negative result does not preclude SARS-CoV-2 infection and should not be used as the sole basis for treatment or other patient management decisions.  A negative result may occur with improper specimen collection / handling, submission of specimen other than nasopharyngeal swab, presence of viral mutation(s) within the areas targeted by this assay, and inadequate number of viral copies (<250 copies / mL). A negative result must be combined with clinical observations, patient history, and epidemiological information.  Fact Sheet for Patients:   RoadLapTop.co.za  Fact Sheet for Healthcare Providers: http://kim-miller.com/  This test is not yet approved or  cleared  by the [place]  FDA and has been authorized for detection and/or diagnosis of SARS-CoV-2 by FDA under an Emergency Use Authorization (EUA).  This EUA will remain in effect (meaning this test can be used) for the duration of the COVID-19 declaration under Section 564(b)(1) of the Act, 21 U.S.C. section 360bbb-3(b)(1), unless the authorization is terminated or revoked sooner.  Performed at [facility], [address] [place], [place] [address]   Culture, blood (routine x 2)     Status: None   Collection Time: 03/09/23  1:50 AM   Specimen: BLOOD  Result Value Ref Range Status   Specimen Description BLOOD LEFT ASSIST CONTROL  Final   Special Requests   Final    BOTTLES DRAWN AEROBIC AND ANAEROBIC Blood Culture adequate volume   Culture   Final    NO GROWTH 5 DAYS Performed at [facility], [address] [place], [place] [address]    Report Status 03/14/2023 FINAL  Final  Culture, blood (routine x 2)     Status: None   Collection Time: 03/09/23  1:50 AM   Specimen:  BLOOD  Result Value Ref Range Status   Specimen Description BLOOD RIGHT HAND  Final   Special Requests   Final    BOTTLES DRAWN AEROBIC AND ANAEROBIC Blood Culture results may not be optimal due to an inadequate volume of blood received in culture bottles   Culture   Final    NO GROWTH 5 DAYS Performed at [facility], [address] [place], [place] [address]    Report Status 03/14/2023 FINAL  Final    Labs: CBC: Recent Labs  Lab 07/20/24 1940 07/21/24 1007 07/22/24 0451 07/26/24 0347  WBC 12.2* 10.7* 9.8 12.5*  NEUTROABS 5.9 6.1 3.9  --   HGB 8.8* 9.8* 8.6* 9.6*  HCT 27.4* 30.6* 26.9* 30.0*  MCV 86.4 86.4 86.2 87.0  PLT 253 257 254 300   Basic Metabolic Panel: Recent Labs  Lab 07/22/24 0451 07/23/24 0504 07/23/24 1951 07/24/24 0344 07/25/24 0348  NA 139 140 139 140 138  K 4.6 5.3* 4.3 4.7 4.7  CL 104 104 101 104 103  CO2 28 26 29 28 27   GLUCOSE 77 71 88 77 86  BUN 28* 22* 21* 24* 22*  CREATININE 1.03* 1.03* 1.12* 1.08* 0.96  CALCIUM  8.5* 8.8* 8.8* 8.6* 8.6*  MG 2.5*  --   --   --   --    Liver Function Tests: Recent Labs  Lab 07/20/24 1940  AST 14*  ALT 12  ALKPHOS 60  BILITOT 0.6  PROT 6.5  ALBUMIN 3.1*   CBG: Recent Labs  Lab 07/22/24 0008 07/22/24 0418 07/22/24 0727 07/22/24 1101 07/22/24 1659  GLUCAP 119* 80 190* 122* 114*    Discharge time spent: greater than 30 minutes.  Signed: AIDA CHO, MD [facility] 07/27/2024

## 2024-07-27 NOTE — Progress Notes (Signed)
 Mobility Specialist - Progress Note   07/27/24 1105  Mobility  Activity Ambulated with assistance  Level of Assistance Contact guard assist, steadying assist  Assistive Device Front wheel walker  Distance Ambulated (ft) 320 ft  Activity Response Tolerated well  Mobility visit 1 Mobility  Mobility Specialist Start Time (ACUTE ONLY) 1034  Mobility Specialist Stop Time (ACUTE ONLY) 1058  Mobility Specialist Time Calculation (min) (ACUTE ONLY) 24 min   Pt supine upon entry, utilizing RA. Pt motivated and agreeable to OOB amb this date. Pt completed bed mob indep, STS to RW and amb two laps around the NS MinG-SBA. Pt slightly unsteady with head turns, one LOB requiring CGA for correction. Pt returned to the room, left seated in the recliner with alarm set and needs within reach. RN and MD notified.  America Silvan Mobility Specialist 07/27/24 11:13 AM

## 2024-07-27 NOTE — TOC Transition Note (Signed)
 Transition of Care [facility]) - Discharge Note   Patient Details  Name: Kelly [name] MRN: [id number] Date of Birth: 1965-12-30  [facility]) CM/SW Contact:  Corean ONEIDA Haddock, RN Phone Number: 07/27/2024, 3:36 PM   Clinical Narrative:     Patient to discharge today [name] with [facility] notifed of discharge Patient called a friend and was picked up from the hospital before RW was delivered to room.  Reqested RW be delivered to home.  Mitch with [employer] notified     Barriers to Discharge: Continued Medical Work up   Patient Goals and CMS Choice   CMS Medicare.gov Compare Post Acute Care list provided to:: Patient Choice offered to / list presented to : Patient      Discharge Placement                       Discharge Plan and Services Additional resources added to the After Visit Summary for                                       Social Drivers of Health (SDOH) Interventions SDOH Screenings   Food Insecurity: Patient Unable To Answer (07/23/2024)  Housing: Patient Unable To Answer (07/23/2024)  Transportation Needs: Patient Unable To Answer (07/23/2024)  Utilities: Patient Unable To Answer (07/23/2024)  Alcohol Screen: Low Risk  (01/12/2024)  Depression (PHQ2-9): High Risk (05/30/2024)  Financial Resource Strain: Low Risk  (01/12/2024)  Physical Activity: Inactive (01/12/2024)  Social Connections: Patient Declined (06/22/2024)  Stress: No Stress Concern Present (01/12/2024)  Tobacco Use: High Risk (07/10/2024)  Health Literacy: Adequate Health Literacy (01/12/2024)     Readmission Risk Interventions    07/22/2024   10:59 AM  Readmission Risk Prevention Plan  Transportation Screening Complete  PCP or Specialist Appt within 3-5 Days Complete  HRI or Home Care Consult Complete  Social Work Consult for Recovery Care Planning/Counseling Complete  Palliative Care Screening Not Applicable  Medication Review Oceanographer) Complete

## 2024-07-27 NOTE — Plan of Care (Signed)

## 2024-07-28 ENCOUNTER — Telehealth: Payer: Self-pay

## 2024-07-28 DIAGNOSIS — R569 Unspecified convulsions: Secondary | ICD-10-CM | POA: Diagnosis not present

## 2024-07-28 NOTE — Transitions of Care (Post Inpatient/ED Visit) (Signed)
   07/28/2024  Name: Erma Raiche MRN: [id number] DOB: 10-25-1966  Today's TOC FU Call Status: Today's TOC FU Call Status:: Unsuccessful Call (1st Attempt) Unsuccessful Call (1st Attempt) Date: 07/28/24 [facility] FU Call Complete Date: 07/27/24  Attempted to reach the patient regarding the most recent Inpatient/ED visit. Patient called back and requested a return call on Monday or Tuesday as she was in [place] and couldn't talk today.  Follow Up Plan: Additional outreach attempts will be made to reach the patient to complete the Transitions of Care (Post Inpatient/ED visit) call.   Richerd Fish, RN, BSN, CCM [facility], [facility] Health RN Care Manager Direct Dial: [phone]

## 2024-07-29 ENCOUNTER — Other Ambulatory Visit: Payer: Self-pay

## 2024-07-31 ENCOUNTER — Telehealth: Payer: Self-pay

## 2024-07-31 NOTE — Transitions of Care (Post Inpatient/ED Visit) (Signed)
   07/31/2024  Name: Kelly [name] MRN: [id number] DOB: [birth year]  Today's TOC FU Call Status: TOC FU Call Complete Date: 07/31/24 Patient's Name and Date of Birth confirmed.  Transition Care Management Follow-up Telephone Call Discharge Facility: [facility]) Type of Discharge: Inpatient Admission Primary Inpatient Discharge Diagnosis:: Seizure How have you been since you were released from the hospital?: Better Any questions or concerns?: No  Items Reviewed: Did you receive and understand the discharge instructions provided?: Yes Medications obtained,verified, and reconciled?: No Any new allergies since your discharge?: No Dietary orders reviewed?: Yes Type of Diet Ordered:: low sodium heart healthy Do you have support at home?: No  Medications Reviewed Today: Patient declines going through each medication stating, I get my meds from [facility] and I have them all.  Attempted review of medication changes from [facility] and she states, I guess I didn't read all of that besides I adjust my meds and lower my own doses because the more they increase the dosage the more I am shaking so the way I am doing it now, I feel much better. Medications Reviewed Today   Medications were not reviewed in this encounter     Home Care and Equipment/Supplies: Were Home Health Services Ordered?: Yes Name of Home Health Agency:: [facility] Has Agency set up a time to come to your home?: Yes Any new equipment or medical supplies ordered?: Yes Name of Medical supply agency?: [facility] Were you able to get the equipment/medical supplies?: Yes Do you have any questions related to the use of the equipment/supplies?: No  Functional Questionnaire: Do you need assistance with bathing/showering or dressing?: No Do you need assistance with meal preparation?: No Do you need assistance with eating?: No Do you have difficulty maintaining continence: No Do you  need assistance with getting out of bed/getting out of a chair/moving?: No Do you have difficulty managing or taking your medications?: No (I have [facility], I am adjusting the med)  Follow up appointments reviewed: PCP Follow-up appointment confirmed?: No Specialist Hospital Follow-up appointment confirmed?: NA Do you need transportation to your follow-up appointment?: No  Patient declines 30 day weekly calls stating let me see what this home health person is going to do first. Patient has been given this contact information and encouraged to call for needs. She verbalized understanding.    Richerd Fish, RN, BSN, CCM [facility], [facility] Health RN Care Manager Direct Dial: [phone]

## 2024-08-03 DIAGNOSIS — I2510 Atherosclerotic heart disease of native coronary artery without angina pectoris: Secondary | ICD-10-CM | POA: Diagnosis not present

## 2024-08-03 DIAGNOSIS — G4733 Obstructive sleep apnea (adult) (pediatric): Secondary | ICD-10-CM | POA: Diagnosis not present

## 2024-08-03 DIAGNOSIS — K5900 Constipation, unspecified: Secondary | ICD-10-CM | POA: Diagnosis not present

## 2024-08-03 DIAGNOSIS — I10 Essential (primary) hypertension: Secondary | ICD-10-CM | POA: Diagnosis not present

## 2024-08-03 DIAGNOSIS — F17210 Nicotine dependence, cigarettes, uncomplicated: Secondary | ICD-10-CM | POA: Diagnosis not present

## 2024-08-03 DIAGNOSIS — R296 Repeated falls: Secondary | ICD-10-CM | POA: Diagnosis not present

## 2024-08-03 DIAGNOSIS — J4520 Mild intermittent asthma, uncomplicated: Secondary | ICD-10-CM | POA: Diagnosis not present

## 2024-08-03 DIAGNOSIS — Z9181 History of falling: Secondary | ICD-10-CM | POA: Diagnosis not present

## 2024-08-03 DIAGNOSIS — K219 Gastro-esophageal reflux disease without esophagitis: Secondary | ICD-10-CM | POA: Diagnosis not present

## 2024-08-03 DIAGNOSIS — E785 Hyperlipidemia, unspecified: Secondary | ICD-10-CM | POA: Diagnosis not present

## 2024-08-03 DIAGNOSIS — M6281 Muscle weakness (generalized): Secondary | ICD-10-CM | POA: Diagnosis not present

## 2024-08-03 DIAGNOSIS — G43909 Migraine, unspecified, not intractable, without status migrainosus: Secondary | ICD-10-CM | POA: Diagnosis not present

## 2024-08-03 DIAGNOSIS — R251 Tremor, unspecified: Secondary | ICD-10-CM | POA: Diagnosis not present

## 2024-08-03 DIAGNOSIS — I252 Old myocardial infarction: Secondary | ICD-10-CM | POA: Diagnosis not present

## 2024-08-04 DIAGNOSIS — R251 Tremor, unspecified: Secondary | ICD-10-CM | POA: Diagnosis not present

## 2024-08-04 DIAGNOSIS — I10 Essential (primary) hypertension: Secondary | ICD-10-CM | POA: Diagnosis not present

## 2024-08-04 DIAGNOSIS — F17210 Nicotine dependence, cigarettes, uncomplicated: Secondary | ICD-10-CM | POA: Diagnosis not present

## 2024-08-04 DIAGNOSIS — K219 Gastro-esophageal reflux disease without esophagitis: Secondary | ICD-10-CM | POA: Diagnosis not present

## 2024-08-04 DIAGNOSIS — I2510 Atherosclerotic heart disease of native coronary artery without angina pectoris: Secondary | ICD-10-CM | POA: Diagnosis not present

## 2024-08-04 DIAGNOSIS — G43909 Migraine, unspecified, not intractable, without status migrainosus: Secondary | ICD-10-CM | POA: Diagnosis not present

## 2024-08-04 DIAGNOSIS — M6281 Muscle weakness (generalized): Secondary | ICD-10-CM | POA: Diagnosis not present

## 2024-08-04 DIAGNOSIS — Z9181 History of falling: Secondary | ICD-10-CM | POA: Diagnosis not present

## 2024-08-04 DIAGNOSIS — K5900 Constipation, unspecified: Secondary | ICD-10-CM | POA: Diagnosis not present

## 2024-08-04 DIAGNOSIS — R296 Repeated falls: Secondary | ICD-10-CM | POA: Diagnosis not present

## 2024-08-04 DIAGNOSIS — I252 Old myocardial infarction: Secondary | ICD-10-CM | POA: Diagnosis not present

## 2024-08-04 DIAGNOSIS — G4733 Obstructive sleep apnea (adult) (pediatric): Secondary | ICD-10-CM | POA: Diagnosis not present

## 2024-08-04 DIAGNOSIS — E785 Hyperlipidemia, unspecified: Secondary | ICD-10-CM | POA: Diagnosis not present

## 2024-08-04 DIAGNOSIS — J4520 Mild intermittent asthma, uncomplicated: Secondary | ICD-10-CM | POA: Diagnosis not present

## 2024-08-04 NOTE — Telephone Encounter (Unsigned)
 Copied from CRM [id number]. Topic: Clinical - Home Health Verbal Orders >> Aug 04, 2024 10:59 AM Emylou G wrote: Caller/Agency: Stacy PT w/[facility] Callback Number: [phone] secure vmail Service Requested: Physical Therapy Frequency: 1w5 and 1w4 Any new concerns about the patient? Yes .Kelly [name] Patient is requesting a Child psychotherapist.. because she wants mental health groups.. Please review.Kelly [name]

## 2024-08-08 ENCOUNTER — Ambulatory Visit (INDEPENDENT_AMBULATORY_CARE_PROVIDER_SITE_OTHER): Admitting: Family Medicine

## 2024-08-08 ENCOUNTER — Telehealth: Payer: Self-pay

## 2024-08-08 ENCOUNTER — Encounter: Payer: Self-pay | Admitting: Family Medicine

## 2024-08-08 VITALS — BP 141/83 | HR 83 | Temp 98.0°F | Ht 66.0 in | Wt 211.8 lb

## 2024-08-08 DIAGNOSIS — K5900 Constipation, unspecified: Secondary | ICD-10-CM | POA: Diagnosis not present

## 2024-08-08 DIAGNOSIS — G43909 Migraine, unspecified, not intractable, without status migrainosus: Secondary | ICD-10-CM | POA: Diagnosis not present

## 2024-08-08 DIAGNOSIS — F251 Schizoaffective disorder, depressive type: Secondary | ICD-10-CM | POA: Diagnosis not present

## 2024-08-08 DIAGNOSIS — I252 Old myocardial infarction: Secondary | ICD-10-CM | POA: Diagnosis not present

## 2024-08-08 DIAGNOSIS — E782 Mixed hyperlipidemia: Secondary | ICD-10-CM

## 2024-08-08 DIAGNOSIS — E785 Hyperlipidemia, unspecified: Secondary | ICD-10-CM | POA: Diagnosis not present

## 2024-08-08 DIAGNOSIS — M6281 Muscle weakness (generalized): Secondary | ICD-10-CM | POA: Diagnosis not present

## 2024-08-08 DIAGNOSIS — I10 Essential (primary) hypertension: Secondary | ICD-10-CM | POA: Diagnosis not present

## 2024-08-08 DIAGNOSIS — R531 Weakness: Secondary | ICD-10-CM

## 2024-08-08 DIAGNOSIS — F17210 Nicotine dependence, cigarettes, uncomplicated: Secondary | ICD-10-CM | POA: Diagnosis not present

## 2024-08-08 DIAGNOSIS — K219 Gastro-esophageal reflux disease without esophagitis: Secondary | ICD-10-CM

## 2024-08-08 DIAGNOSIS — R296 Repeated falls: Secondary | ICD-10-CM | POA: Diagnosis not present

## 2024-08-08 DIAGNOSIS — I2510 Atherosclerotic heart disease of native coronary artery without angina pectoris: Secondary | ICD-10-CM | POA: Diagnosis not present

## 2024-08-08 DIAGNOSIS — J4520 Mild intermittent asthma, uncomplicated: Secondary | ICD-10-CM | POA: Diagnosis not present

## 2024-08-08 DIAGNOSIS — R569 Unspecified convulsions: Secondary | ICD-10-CM | POA: Diagnosis not present

## 2024-08-08 DIAGNOSIS — G4733 Obstructive sleep apnea (adult) (pediatric): Secondary | ICD-10-CM | POA: Diagnosis not present

## 2024-08-08 DIAGNOSIS — R220 Localized swelling, mass and lump, head: Secondary | ICD-10-CM

## 2024-08-08 DIAGNOSIS — Z9181 History of falling: Secondary | ICD-10-CM | POA: Diagnosis not present

## 2024-08-08 DIAGNOSIS — R251 Tremor, unspecified: Secondary | ICD-10-CM | POA: Diagnosis not present

## 2024-08-08 MED ORDER — OMEPRAZOLE 40 MG PO CPDR: 40.0000 mg | DELAYED_RELEASE_CAPSULE | Freq: Every day | ORAL | 1 refills | Status: DC

## 2024-08-08 NOTE — Progress Notes (Signed)
 Complex Care Management Note  Care Guide Note 08/08/2024 Name: Kelly [name] MRN: [id number] DOB: Nov 05, 1966  Kelly [name] is a 58 y.o. year old female who sees Wellington Curtis LABOR, FNP for primary care. I reached out to Kelly [name] by phone today to offer complex care management services.  Kelly [name] was given information about Complex Care Management services today including:   The Complex Care Management services include support from the care team which includes your Nurse Care Manager, Clinical Social Worker, or Pharmacist.  The Complex Care Management team is here to help remove barriers to the health concerns and goals most important to you. Complex Care Management services are voluntary, and the patient may decline or stop services at any time by request to their care team member.   Complex Care Management Consent Status: Patient agreed to services and verbal consent obtained.   Follow up plan:  Telephone appointment with complex care management team member scheduled for:  08/29/24 @ 2 PM  Encounter Outcome:  Patient Scheduled  Leotis Rase [facility], [facility] Guide  Direct Dial: [phone]  Fax [phone]

## 2024-08-08 NOTE — Progress Notes (Signed)
   Established Patient Office Visit  Subjective   Patient ID: Kelly [name], female    DOB: 1966/08/07  Age: 58 y.o. MRN: [id number]  Chief Complaint  Patient presents with  . Hospitalization Follow-up    Stated that she had a couple of seizures and was admitted.  Discharged from hospital on 07/27/2024.  Reports that since getting out of the hospital she has not been feeling good.   Discussed the use of AI scribe software for clinical note transcription with the patient, who gave verbal consent to proceed.  Social worker [facility]  History of Present Illness      {History (Optional):23778}    08/08/2024   10:18 AM 05/30/2024    1:03 PM 05/04/2024   11:10 AM  Depression screen PHQ 2/9  Decreased Interest 3  3  Down, Depressed, Hopeless 3  3  PHQ - 2 Score 6  6  Altered sleeping 2    Tired, decreased energy 2  1  Change in appetite 2  1  Feeling bad or failure about yourself  1  2  Trouble concentrating 2  3  Moving slowly or fidgety/restless 2  1  Suicidal thoughts 1  2  PHQ-9 Score 18    Difficult doing work/chores Very difficult  Very difficult     Information is confidential and restricted. Go to Review Flowsheets to unlock data.       08/08/2024   10:18 AM 05/30/2024    1:03 PM 05/04/2024   11:11 AM 01/18/2024   10:21 AM  GAD 7 : Generalized Anxiety Score  Nervous, Anxious, on Edge 1  2 3   Control/stop worrying 1  2 2   Worry too much - different things 1  2 1   Trouble relaxing 1  3 2   Restless 1  0 2  Easily annoyed or irritable 1  0 2  Afraid - awful might happen 1  2 2   Total GAD 7 Score 7  11 14   Anxiety Difficulty Somewhat difficult  Very difficult Somewhat difficult     Information is confidential and restricted. Go to Review Flowsheets to unlock data.     ROS  Negative unless indicated in HPI   Objective:     BP (!) 151/80 (BP Location: Left Arm, Patient Position: Sitting, Cuff Size: Large)   Pulse 83   Temp 98 F (36.7 C) (Oral)   Ht 5'  6 (1.676 m)   Wt 211 lb 12.8 oz (96.1 kg)   LMP  (LMP Unknown)   SpO2 98%   BMI 34.19 kg/m  {Vitals History (Optional):23777}  Physical Exam   No results found for any visits on 08/08/24.  {Labs (Optional):23779}  The ASCVD Risk score (Arnett DK, et al., 2019) failed to calculate for the following reasons:   Risk score cannot be calculated because patient has a medical history suggesting prior/existing ASCVD    Assessment & Plan:  There are no diagnoses linked to this encounter.   Assessment and Plan Assessment & Plan       No follow-ups on file.    Curtis DELENA Boom, FNP

## 2024-08-09 ENCOUNTER — Encounter: Payer: Self-pay | Admitting: Family Medicine

## 2024-08-09 ENCOUNTER — Other Ambulatory Visit: Payer: Self-pay | Admitting: Family Medicine

## 2024-08-09 ENCOUNTER — Telehealth: Payer: Self-pay

## 2024-08-09 DIAGNOSIS — J4520 Mild intermittent asthma, uncomplicated: Secondary | ICD-10-CM

## 2024-08-09 NOTE — Progress Notes (Signed)
   Telephone encounter was:  Unsuccessful.  08/09/2024 Name: Francetta Ilg MRN: [id number] DOB: [birth year]  Unsuccessful outbound call made today to assist with:  Transportation Needs   Outreach Attempt:  1st Attempt  No answer and unable to leave a message    Jon Colt [facility]  [facility] Guide, Phone: [phone] Fax: [phone] Website: [email or web address]

## 2024-08-11 ENCOUNTER — Telehealth: Payer: Self-pay

## 2024-08-11 DIAGNOSIS — K219 Gastro-esophageal reflux disease without esophagitis: Secondary | ICD-10-CM | POA: Diagnosis not present

## 2024-08-11 DIAGNOSIS — F17210 Nicotine dependence, cigarettes, uncomplicated: Secondary | ICD-10-CM | POA: Diagnosis not present

## 2024-08-11 DIAGNOSIS — G43909 Migraine, unspecified, not intractable, without status migrainosus: Secondary | ICD-10-CM | POA: Diagnosis not present

## 2024-08-11 DIAGNOSIS — K5900 Constipation, unspecified: Secondary | ICD-10-CM | POA: Diagnosis not present

## 2024-08-11 DIAGNOSIS — R251 Tremor, unspecified: Secondary | ICD-10-CM | POA: Diagnosis not present

## 2024-08-11 DIAGNOSIS — M6281 Muscle weakness (generalized): Secondary | ICD-10-CM | POA: Diagnosis not present

## 2024-08-11 DIAGNOSIS — I2510 Atherosclerotic heart disease of native coronary artery without angina pectoris: Secondary | ICD-10-CM | POA: Diagnosis not present

## 2024-08-11 DIAGNOSIS — J4520 Mild intermittent asthma, uncomplicated: Secondary | ICD-10-CM | POA: Diagnosis not present

## 2024-08-11 DIAGNOSIS — I252 Old myocardial infarction: Secondary | ICD-10-CM | POA: Diagnosis not present

## 2024-08-11 DIAGNOSIS — R296 Repeated falls: Secondary | ICD-10-CM | POA: Diagnosis not present

## 2024-08-11 DIAGNOSIS — I10 Essential (primary) hypertension: Secondary | ICD-10-CM | POA: Diagnosis not present

## 2024-08-11 DIAGNOSIS — Z9181 History of falling: Secondary | ICD-10-CM | POA: Diagnosis not present

## 2024-08-11 DIAGNOSIS — G4733 Obstructive sleep apnea (adult) (pediatric): Secondary | ICD-10-CM | POA: Diagnosis not present

## 2024-08-11 DIAGNOSIS — E785 Hyperlipidemia, unspecified: Secondary | ICD-10-CM | POA: Diagnosis not present

## 2024-08-11 NOTE — Progress Notes (Signed)
   Telephone encounter was:  Unsuccessful.  08/11/2024 Name: Breta Demedeiros MRN: [id number] DOB: 17-Apr-1966  Unsuccessful outbound call made today to assist with:  Transportation Needs   Outreach Attempt:  2nd Attempt  No answer and unable to leave a message    Jon Colt [facility]  [facility] Guide, Phone: [phone] Fax: [phone] Website: [email or web address]

## 2024-08-14 ENCOUNTER — Telehealth: Payer: Self-pay

## 2024-08-14 NOTE — Progress Notes (Signed)
   Telephone encounter was:  Unsuccessful.  08/14/2024 Name: Kelly [name] MRN: [id number] DOB: September 01, 1966  Unsuccessful outbound call made today to assist with:  Transportation Needs   Outreach Attempt:  3rd Attempt.  Referral closed unable to contact patient.  A HIPAA compliant voice message was left requesting a return call.  Instructed patient to call back    Jon Colt [facility] Guide, Phone: [phone] Fax: [phone] Website: [email or web address]

## 2024-08-16 ENCOUNTER — Telehealth: Payer: Self-pay

## 2024-08-16 DIAGNOSIS — Z9181 History of falling: Secondary | ICD-10-CM | POA: Diagnosis not present

## 2024-08-16 DIAGNOSIS — M6281 Muscle weakness (generalized): Secondary | ICD-10-CM | POA: Diagnosis not present

## 2024-08-16 DIAGNOSIS — R251 Tremor, unspecified: Secondary | ICD-10-CM | POA: Diagnosis not present

## 2024-08-16 DIAGNOSIS — G43909 Migraine, unspecified, not intractable, without status migrainosus: Secondary | ICD-10-CM | POA: Diagnosis not present

## 2024-08-16 DIAGNOSIS — K219 Gastro-esophageal reflux disease without esophagitis: Secondary | ICD-10-CM | POA: Diagnosis not present

## 2024-08-16 DIAGNOSIS — R296 Repeated falls: Secondary | ICD-10-CM | POA: Diagnosis not present

## 2024-08-16 DIAGNOSIS — G4733 Obstructive sleep apnea (adult) (pediatric): Secondary | ICD-10-CM | POA: Diagnosis not present

## 2024-08-16 DIAGNOSIS — I252 Old myocardial infarction: Secondary | ICD-10-CM | POA: Diagnosis not present

## 2024-08-16 DIAGNOSIS — E785 Hyperlipidemia, unspecified: Secondary | ICD-10-CM | POA: Diagnosis not present

## 2024-08-16 DIAGNOSIS — F17210 Nicotine dependence, cigarettes, uncomplicated: Secondary | ICD-10-CM | POA: Diagnosis not present

## 2024-08-16 DIAGNOSIS — J4520 Mild intermittent asthma, uncomplicated: Secondary | ICD-10-CM | POA: Diagnosis not present

## 2024-08-16 DIAGNOSIS — K5900 Constipation, unspecified: Secondary | ICD-10-CM | POA: Diagnosis not present

## 2024-08-16 DIAGNOSIS — I10 Essential (primary) hypertension: Secondary | ICD-10-CM | POA: Diagnosis not present

## 2024-08-16 DIAGNOSIS — I2510 Atherosclerotic heart disease of native coronary artery without angina pectoris: Secondary | ICD-10-CM | POA: Diagnosis not present

## 2024-08-16 NOTE — Telephone Encounter (Signed)
 Copied from CRM #[id number]. Topic: Clinical - Home Health Verbal Orders >> Aug 16, 2024 10:33 AM Winona SAUNDERS wrote: Caller/Agency: [facility] home health nurse calling from [facility] Number: [phone] Service Requested: Social worker Any new concerns about the patient? Pt is having a hard time getting out into the community.  Please feel free to fax to their office Fax number [phone]

## 2024-08-16 NOTE — Telephone Encounter (Signed)
 Called phone number list, left voicemail for Lacie to call back regarding patient

## 2024-08-17 DIAGNOSIS — G4733 Obstructive sleep apnea (adult) (pediatric): Secondary | ICD-10-CM | POA: Diagnosis not present

## 2024-08-17 DIAGNOSIS — R251 Tremor, unspecified: Secondary | ICD-10-CM | POA: Diagnosis not present

## 2024-08-17 DIAGNOSIS — R296 Repeated falls: Secondary | ICD-10-CM | POA: Diagnosis not present

## 2024-08-17 DIAGNOSIS — Z9181 History of falling: Secondary | ICD-10-CM | POA: Diagnosis not present

## 2024-08-17 DIAGNOSIS — I10 Essential (primary) hypertension: Secondary | ICD-10-CM | POA: Diagnosis not present

## 2024-08-17 DIAGNOSIS — G43909 Migraine, unspecified, not intractable, without status migrainosus: Secondary | ICD-10-CM | POA: Diagnosis not present

## 2024-08-17 DIAGNOSIS — F17210 Nicotine dependence, cigarettes, uncomplicated: Secondary | ICD-10-CM | POA: Diagnosis not present

## 2024-08-17 DIAGNOSIS — I252 Old myocardial infarction: Secondary | ICD-10-CM | POA: Diagnosis not present

## 2024-08-17 DIAGNOSIS — K5900 Constipation, unspecified: Secondary | ICD-10-CM | POA: Diagnosis not present

## 2024-08-17 DIAGNOSIS — M6281 Muscle weakness (generalized): Secondary | ICD-10-CM | POA: Diagnosis not present

## 2024-08-17 DIAGNOSIS — K219 Gastro-esophageal reflux disease without esophagitis: Secondary | ICD-10-CM | POA: Diagnosis not present

## 2024-08-17 DIAGNOSIS — E785 Hyperlipidemia, unspecified: Secondary | ICD-10-CM | POA: Diagnosis not present

## 2024-08-17 DIAGNOSIS — I2510 Atherosclerotic heart disease of native coronary artery without angina pectoris: Secondary | ICD-10-CM | POA: Diagnosis not present

## 2024-08-17 DIAGNOSIS — J4520 Mild intermittent asthma, uncomplicated: Secondary | ICD-10-CM | POA: Diagnosis not present

## 2024-08-18 DIAGNOSIS — I10 Essential (primary) hypertension: Secondary | ICD-10-CM | POA: Diagnosis not present

## 2024-08-18 DIAGNOSIS — G4733 Obstructive sleep apnea (adult) (pediatric): Secondary | ICD-10-CM | POA: Diagnosis not present

## 2024-08-18 DIAGNOSIS — K219 Gastro-esophageal reflux disease without esophagitis: Secondary | ICD-10-CM | POA: Diagnosis not present

## 2024-08-18 DIAGNOSIS — G43909 Migraine, unspecified, not intractable, without status migrainosus: Secondary | ICD-10-CM | POA: Diagnosis not present

## 2024-08-18 DIAGNOSIS — R296 Repeated falls: Secondary | ICD-10-CM | POA: Diagnosis not present

## 2024-08-18 DIAGNOSIS — R251 Tremor, unspecified: Secondary | ICD-10-CM | POA: Diagnosis not present

## 2024-08-18 DIAGNOSIS — M6281 Muscle weakness (generalized): Secondary | ICD-10-CM | POA: Diagnosis not present

## 2024-08-18 DIAGNOSIS — J4520 Mild intermittent asthma, uncomplicated: Secondary | ICD-10-CM | POA: Diagnosis not present

## 2024-08-18 DIAGNOSIS — Z9181 History of falling: Secondary | ICD-10-CM | POA: Diagnosis not present

## 2024-08-18 DIAGNOSIS — K5900 Constipation, unspecified: Secondary | ICD-10-CM | POA: Diagnosis not present

## 2024-08-18 DIAGNOSIS — I2510 Atherosclerotic heart disease of native coronary artery without angina pectoris: Secondary | ICD-10-CM | POA: Diagnosis not present

## 2024-08-18 DIAGNOSIS — F17210 Nicotine dependence, cigarettes, uncomplicated: Secondary | ICD-10-CM | POA: Diagnosis not present

## 2024-08-18 DIAGNOSIS — I252 Old myocardial infarction: Secondary | ICD-10-CM | POA: Diagnosis not present

## 2024-08-18 DIAGNOSIS — E785 Hyperlipidemia, unspecified: Secondary | ICD-10-CM | POA: Diagnosis not present

## 2024-08-18 NOTE — Telephone Encounter (Unsigned)
 Copied from CRM #[id number]. Topic: Clinical - Home Health Verbal Orders >> Aug 16, 2024 10:33 AM Winona SAUNDERS wrote: Caller/Agency: [facility] home health nurse calling from [facility] Number: [phone] Service Requested: Social worker Any new concerns about the patient? Pt is having a hard time getting out into the community.  Please feel free to fax to their office Fax number [phone] >> Aug 17, 2024  3:54 PM Tiffini S wrote: Need asap >> Aug 17, 2024  3:53 PM Tiffini S wrote: Lacie with [facility] [phone] is a home health nurse calling about Social worker services- asked that verbal orders be faxed to [phone] Had a visit with the patient yesterday and patient have transportation issues

## 2024-08-21 DIAGNOSIS — E785 Hyperlipidemia, unspecified: Secondary | ICD-10-CM | POA: Diagnosis not present

## 2024-08-21 DIAGNOSIS — I2510 Atherosclerotic heart disease of native coronary artery without angina pectoris: Secondary | ICD-10-CM | POA: Diagnosis not present

## 2024-08-21 DIAGNOSIS — K219 Gastro-esophageal reflux disease without esophagitis: Secondary | ICD-10-CM | POA: Diagnosis not present

## 2024-08-21 DIAGNOSIS — F17210 Nicotine dependence, cigarettes, uncomplicated: Secondary | ICD-10-CM | POA: Diagnosis not present

## 2024-08-21 DIAGNOSIS — Z9181 History of falling: Secondary | ICD-10-CM | POA: Diagnosis not present

## 2024-08-21 DIAGNOSIS — I252 Old myocardial infarction: Secondary | ICD-10-CM | POA: Diagnosis not present

## 2024-08-21 DIAGNOSIS — G4733 Obstructive sleep apnea (adult) (pediatric): Secondary | ICD-10-CM | POA: Diagnosis not present

## 2024-08-21 DIAGNOSIS — R296 Repeated falls: Secondary | ICD-10-CM | POA: Diagnosis not present

## 2024-08-21 DIAGNOSIS — M6281 Muscle weakness (generalized): Secondary | ICD-10-CM | POA: Diagnosis not present

## 2024-08-21 DIAGNOSIS — K5900 Constipation, unspecified: Secondary | ICD-10-CM | POA: Diagnosis not present

## 2024-08-21 DIAGNOSIS — G43909 Migraine, unspecified, not intractable, without status migrainosus: Secondary | ICD-10-CM | POA: Diagnosis not present

## 2024-08-21 DIAGNOSIS — J4520 Mild intermittent asthma, uncomplicated: Secondary | ICD-10-CM | POA: Diagnosis not present

## 2024-08-21 DIAGNOSIS — I10 Essential (primary) hypertension: Secondary | ICD-10-CM | POA: Diagnosis not present

## 2024-08-21 DIAGNOSIS — R251 Tremor, unspecified: Secondary | ICD-10-CM | POA: Diagnosis not present

## 2024-08-21 NOTE — Telephone Encounter (Signed)
 Vertell from [place] called for update on request  Just as a FYI the patient does not want anyone other than nursing, PT, and Child psychotherapist. Pt does not want to see new faces.   Best contact: [phone]

## 2024-08-22 DIAGNOSIS — G43909 Migraine, unspecified, not intractable, without status migrainosus: Secondary | ICD-10-CM | POA: Diagnosis not present

## 2024-08-22 DIAGNOSIS — I2510 Atherosclerotic heart disease of native coronary artery without angina pectoris: Secondary | ICD-10-CM | POA: Diagnosis not present

## 2024-08-22 DIAGNOSIS — G40909 Epilepsy, unspecified, not intractable, without status epilepticus: Secondary | ICD-10-CM | POA: Diagnosis not present

## 2024-08-22 DIAGNOSIS — K5900 Constipation, unspecified: Secondary | ICD-10-CM | POA: Diagnosis not present

## 2024-08-22 DIAGNOSIS — R296 Repeated falls: Secondary | ICD-10-CM | POA: Diagnosis not present

## 2024-08-22 DIAGNOSIS — E785 Hyperlipidemia, unspecified: Secondary | ICD-10-CM | POA: Diagnosis not present

## 2024-08-22 DIAGNOSIS — Z9181 History of falling: Secondary | ICD-10-CM | POA: Diagnosis not present

## 2024-08-22 DIAGNOSIS — F259 Schizoaffective disorder, unspecified: Secondary | ICD-10-CM

## 2024-08-22 DIAGNOSIS — I10 Essential (primary) hypertension: Secondary | ICD-10-CM | POA: Diagnosis not present

## 2024-08-22 DIAGNOSIS — G4733 Obstructive sleep apnea (adult) (pediatric): Secondary | ICD-10-CM | POA: Diagnosis not present

## 2024-08-22 DIAGNOSIS — F32A Depression, unspecified: Secondary | ICD-10-CM

## 2024-08-22 DIAGNOSIS — F17210 Nicotine dependence, cigarettes, uncomplicated: Secondary | ICD-10-CM | POA: Diagnosis not present

## 2024-08-22 DIAGNOSIS — R251 Tremor, unspecified: Secondary | ICD-10-CM | POA: Diagnosis not present

## 2024-08-22 DIAGNOSIS — J4520 Mild intermittent asthma, uncomplicated: Secondary | ICD-10-CM | POA: Diagnosis not present

## 2024-08-22 DIAGNOSIS — M6281 Muscle weakness (generalized): Secondary | ICD-10-CM | POA: Diagnosis not present

## 2024-08-22 DIAGNOSIS — I252 Old myocardial infarction: Secondary | ICD-10-CM | POA: Diagnosis not present

## 2024-08-22 DIAGNOSIS — K219 Gastro-esophageal reflux disease without esophagitis: Secondary | ICD-10-CM | POA: Diagnosis not present

## 2024-08-22 DIAGNOSIS — F419 Anxiety disorder, unspecified: Secondary | ICD-10-CM

## 2024-08-22 NOTE — Telephone Encounter (Unsigned)
 Copied from CRM #[id number]. Topic: Clinical - Home Health Verbal Orders >> Aug 16, 2024 10:33 AM Winona SAUNDERS wrote: Caller/Agency: [facility] home health nurse calling from [facility] Number: [phone] Service Requested: Social worker Any new concerns about the patient? Pt is having a hard time getting out into the community.  Please feel free to fax to their office Fax number [phone] >> Aug 22, 2024 11:25 AM Ivette P wrote: [facility] called in to follow up on papers being faxed over please follow up with [facility]ie - [phone] - secured line  >> Aug 17, 2024  3:54 PM Tiffini S wrote: Need asap >> Aug 17, 2024  3:53 PM Tiffini S wrote: [facility] with [facility] [phone] is a home health nurse calling about Social worker services- asked that verbal orders be faxed to [phone] Had a visit with the patient yesterday and patient have transportation issues

## 2024-08-22 NOTE — Telephone Encounter (Signed)
 Signed, brought back up to [facility] [address] front for faxing

## 2024-08-23 DIAGNOSIS — Z87898 Personal history of other specified conditions: Secondary | ICD-10-CM | POA: Diagnosis not present

## 2024-08-23 DIAGNOSIS — R519 Headache, unspecified: Secondary | ICD-10-CM | POA: Diagnosis not present

## 2024-08-23 DIAGNOSIS — R42 Dizziness and giddiness: Secondary | ICD-10-CM | POA: Diagnosis not present

## 2024-08-23 DIAGNOSIS — R202 Paresthesia of skin: Secondary | ICD-10-CM | POA: Diagnosis not present

## 2024-08-23 DIAGNOSIS — G479 Sleep disorder, unspecified: Secondary | ICD-10-CM | POA: Diagnosis not present

## 2024-08-23 DIAGNOSIS — R200 Anesthesia of skin: Secondary | ICD-10-CM | POA: Diagnosis not present

## 2024-08-23 NOTE — Telephone Encounter (Signed)
 Noted

## 2024-08-24 DIAGNOSIS — R251 Tremor, unspecified: Secondary | ICD-10-CM | POA: Diagnosis not present

## 2024-08-24 DIAGNOSIS — K219 Gastro-esophageal reflux disease without esophagitis: Secondary | ICD-10-CM | POA: Diagnosis not present

## 2024-08-24 DIAGNOSIS — E785 Hyperlipidemia, unspecified: Secondary | ICD-10-CM | POA: Diagnosis not present

## 2024-08-24 DIAGNOSIS — I2510 Atherosclerotic heart disease of native coronary artery without angina pectoris: Secondary | ICD-10-CM | POA: Diagnosis not present

## 2024-08-24 DIAGNOSIS — I10 Essential (primary) hypertension: Secondary | ICD-10-CM | POA: Diagnosis not present

## 2024-08-24 DIAGNOSIS — G43909 Migraine, unspecified, not intractable, without status migrainosus: Secondary | ICD-10-CM | POA: Diagnosis not present

## 2024-08-24 DIAGNOSIS — J4520 Mild intermittent asthma, uncomplicated: Secondary | ICD-10-CM | POA: Diagnosis not present

## 2024-08-24 DIAGNOSIS — M6281 Muscle weakness (generalized): Secondary | ICD-10-CM | POA: Diagnosis not present

## 2024-08-24 DIAGNOSIS — G4733 Obstructive sleep apnea (adult) (pediatric): Secondary | ICD-10-CM | POA: Diagnosis not present

## 2024-08-24 DIAGNOSIS — R296 Repeated falls: Secondary | ICD-10-CM | POA: Diagnosis not present

## 2024-08-24 DIAGNOSIS — Z9181 History of falling: Secondary | ICD-10-CM | POA: Diagnosis not present

## 2024-08-24 DIAGNOSIS — K5900 Constipation, unspecified: Secondary | ICD-10-CM | POA: Diagnosis not present

## 2024-08-24 DIAGNOSIS — F17210 Nicotine dependence, cigarettes, uncomplicated: Secondary | ICD-10-CM | POA: Diagnosis not present

## 2024-08-24 DIAGNOSIS — I252 Old myocardial infarction: Secondary | ICD-10-CM | POA: Diagnosis not present

## 2024-08-29 ENCOUNTER — Other Ambulatory Visit: Payer: Self-pay | Admitting: *Deleted

## 2024-08-29 DIAGNOSIS — R251 Tremor, unspecified: Secondary | ICD-10-CM | POA: Diagnosis not present

## 2024-08-29 DIAGNOSIS — G43909 Migraine, unspecified, not intractable, without status migrainosus: Secondary | ICD-10-CM | POA: Diagnosis not present

## 2024-08-29 DIAGNOSIS — I10 Essential (primary) hypertension: Secondary | ICD-10-CM | POA: Diagnosis not present

## 2024-08-29 DIAGNOSIS — R296 Repeated falls: Secondary | ICD-10-CM | POA: Diagnosis not present

## 2024-08-29 DIAGNOSIS — Z9181 History of falling: Secondary | ICD-10-CM | POA: Diagnosis not present

## 2024-08-29 DIAGNOSIS — G4733 Obstructive sleep apnea (adult) (pediatric): Secondary | ICD-10-CM | POA: Diagnosis not present

## 2024-08-29 DIAGNOSIS — F17210 Nicotine dependence, cigarettes, uncomplicated: Secondary | ICD-10-CM | POA: Diagnosis not present

## 2024-08-29 DIAGNOSIS — E785 Hyperlipidemia, unspecified: Secondary | ICD-10-CM | POA: Diagnosis not present

## 2024-08-29 DIAGNOSIS — J4520 Mild intermittent asthma, uncomplicated: Secondary | ICD-10-CM | POA: Diagnosis not present

## 2024-08-29 DIAGNOSIS — M6281 Muscle weakness (generalized): Secondary | ICD-10-CM | POA: Diagnosis not present

## 2024-08-29 DIAGNOSIS — I252 Old myocardial infarction: Secondary | ICD-10-CM | POA: Diagnosis not present

## 2024-08-29 DIAGNOSIS — K219 Gastro-esophageal reflux disease without esophagitis: Secondary | ICD-10-CM | POA: Diagnosis not present

## 2024-08-29 DIAGNOSIS — I2510 Atherosclerotic heart disease of native coronary artery without angina pectoris: Secondary | ICD-10-CM | POA: Diagnosis not present

## 2024-08-29 DIAGNOSIS — K5900 Constipation, unspecified: Secondary | ICD-10-CM | POA: Diagnosis not present

## 2024-08-30 DIAGNOSIS — K219 Gastro-esophageal reflux disease without esophagitis: Secondary | ICD-10-CM | POA: Diagnosis not present

## 2024-08-30 DIAGNOSIS — R296 Repeated falls: Secondary | ICD-10-CM | POA: Diagnosis not present

## 2024-08-30 DIAGNOSIS — M6281 Muscle weakness (generalized): Secondary | ICD-10-CM | POA: Diagnosis not present

## 2024-08-30 DIAGNOSIS — J4520 Mild intermittent asthma, uncomplicated: Secondary | ICD-10-CM | POA: Diagnosis not present

## 2024-08-30 DIAGNOSIS — Z9181 History of falling: Secondary | ICD-10-CM | POA: Diagnosis not present

## 2024-08-30 DIAGNOSIS — R251 Tremor, unspecified: Secondary | ICD-10-CM | POA: Diagnosis not present

## 2024-08-30 DIAGNOSIS — K5900 Constipation, unspecified: Secondary | ICD-10-CM | POA: Diagnosis not present

## 2024-08-30 DIAGNOSIS — I2510 Atherosclerotic heart disease of native coronary artery without angina pectoris: Secondary | ICD-10-CM | POA: Diagnosis not present

## 2024-08-30 DIAGNOSIS — F17210 Nicotine dependence, cigarettes, uncomplicated: Secondary | ICD-10-CM | POA: Diagnosis not present

## 2024-08-30 DIAGNOSIS — E785 Hyperlipidemia, unspecified: Secondary | ICD-10-CM | POA: Diagnosis not present

## 2024-08-30 DIAGNOSIS — I252 Old myocardial infarction: Secondary | ICD-10-CM | POA: Diagnosis not present

## 2024-08-30 DIAGNOSIS — I10 Essential (primary) hypertension: Secondary | ICD-10-CM | POA: Diagnosis not present

## 2024-08-30 DIAGNOSIS — G4733 Obstructive sleep apnea (adult) (pediatric): Secondary | ICD-10-CM | POA: Diagnosis not present

## 2024-08-30 DIAGNOSIS — G43909 Migraine, unspecified, not intractable, without status migrainosus: Secondary | ICD-10-CM | POA: Diagnosis not present

## 2024-08-30 NOTE — Patient Outreach (Signed)
 Complex Care Management   Visit Note  08/30/2024  Name:  Kelly [name] MRN: [id number] DOB: 24-Dec-1965  Situation: Referral received for Complex Care Management related to Mental/Behavioral Health diagnosis Schizoaffective disorder, depressive type. I obtained verbal consent from Patient.  Visit completed with Patient  on the phone on 08/29/24  Background:   Past Medical History:  Diagnosis Date   Acid reflux    Anxiety    Depression    Hypercholesteremia    Hypertension    Myocardial infarction (HCC)    Schizophrenia (HCC)    Seizures (HCC)    Sleep apnea     Assessment: Patient Reported Symptoms:  Cognitive Cognitive Status: Alert and oriented to person, place, and time, Normal speech and language skills, Able to follow simple commands Cognitive/Intellectual Conditions Management [RPT]: Other Other: seizures   Health Maintenance Behaviors: Annual physical exam Healing Pattern: Average Health Facilitated by: Rest  Neurological Neurological Review of Symptoms: Headaches, Other: [place] Neurological Symptoms/Conditions [RPT]: patient states that she has seizures (one every 2 months) last seizure in August Neurological Management Strategies: Medication therapy Neurological Comment: Per patient, EKG scheduled for 10/06/24  HEENT HEENT Symptoms Reported: No symptoms reported      Cardiovascular Cardiovascular Symptoms Reported: No symptoms reported    Respiratory Respiratory Symptoms Reported: No symptoms reported    Endocrine Endocrine Symptoms Reported: No symptoms reported Is patient diabetic?: No    Gastrointestinal Gastrointestinal Symptoms Reported: No symptoms reported      Genitourinary Genitourinary Symptoms Reported: Incontinence Additional Genitourinary Details: wets bed when having sizure Genitourinary Management Strategies: Incontinence garment/pad  Integumentary Integumentary Symptoms Reported: No symptoms reported    Musculoskeletal  Musculoskelatal Symptoms Reviewed: Unsteady gait, Muscle pain Additional Musculoskeletal Details: uses walker to ambulate -knee pain, right shoulder pain currently receives [facility] PT Musculoskeletal Management Strategies: Medication therapy Falls in the past year?: Yes Number of falls in past year: 2 or more Was there an injury with Fall?: Yes Fall Risk Category Calculator: 3 Patient Fall Risk Level: High Fall Risk Patient at Risk for Falls Due to: History of fall(s), Impaired balance/gait  Psychosocial Psychosocial Symptoms Reported: Depression - if selected complete PHQ 2-9, Anxiety - if selected complete GAD Additional Psychological Details: referral to [facility] Behavioral Management Strategies: Adequate rest, Coping strategies Major Change/Loss/Stressor/Fears (CP): Medical condition, self Behaviors When Feeling Stressed/Fearful: close my doors and watch TV' Techniques to Cope with Loss/Stress/Change: Diversional activities Quality of Family Relationships: non-existent Do you feel physically threatened by others?: No    08/30/2024    PHQ2-9 Depression Screening   Little interest or pleasure in doing things More than half the days  Feeling down, depressed, or hopeless Several days  PHQ-2 - Total Score 3  Trouble falling or staying asleep, or sleeping too much Not at all  Feeling tired or having little energy More than half the days  Poor appetite or overeating  Not at all  Feeling bad about yourself - or that you are a failure or have let yourself or your family down Not at all  Trouble concentrating on things, such as reading the newspaper or watching television Not at all  Moving or speaking so slowly that other people could have noticed.  Or the opposite - being so fidgety or restless that you have been moving around a lot more than usual Not at all  Thoughts that you would be better off dead, or hurting yourself in some way Not at all  PHQ2-9 Total Score 5  If you checked off any problems, how difficult have these problems made it for you to do your work, take care of things at home, or get along with other people Somewhat difficult  Depression Interventions/Treatment Referral to Psychiatry, Medication    There were no vitals filed for this visit.  Medications Reviewed Today     Reviewed by Ermalinda Lenn HERO, LCSW (Social Worker) on 08/29/24 at 1429  Med List Status: <None>   Medication Order Taking? Sig Documenting Provider Last Dose Status Informant  amLODipine  (NORVASC ) 5 MG tablet [id number] Yes Take 1 tablet (5 mg total) by mouth daily. Wellington Curtis LABOR, FNP  Active Nursing Home Medication Administration Guide (MAG)  atorvastatin  (LIPITOR) 40 MG tablet [id number] Yes Take 1 tablet (40 mg total) by mouth at bedtime. Wellington Curtis LABOR, FNP  Active Nursing Home Medication Administration Guide (MAG)  cloNIDine  (CATAPRES ) 0.1 MG tablet [id number] Yes TAKE 1 TABLET BY MOUTH DAILY Clifton, Kellie A, FNP  Active   divalproex  (DEPAKOTE ) 500 MG DR tablet [id number] Yes Take 500 mg by mouth 2 (two) times daily. [provider]  Active Nursing Home Medication Administration Guide (MAG)  ferrous sulfate  325 (65 FE) MG tablet [id number] Yes Take 1 tablet (325 mg total) by mouth daily with breakfast. Franchot Novel, MD  Active   gabapentin  (NEURONTIN ) 300 MG capsule [id number] Yes Take 1 (one) 300 mg tablet at breakfast and 1 (one)  300mg  tablet at lunch time. Take 3 (three) 300mg  tablets before bedtime. Franchot Novel, MD  Active   lamoTRIgine  (LAMICTAL ) 200 MG tablet [id number] Yes Take 1 tablet (200 mg total) by mouth 2 (two) times daily. Sherial Bail, MD  Active Nursing Home Medication Administration Guide (MAG)  losartan  (COZAAR ) 25 MG tablet [id number] Yes Take 1 tablet (25 mg total) by mouth daily. Wellington Curtis LABOR, FNP  Active Nursing Home Medication Administration Guide (MAG)  montelukast  (SINGULAIR ) 10 MG tablet [id number] Yes Take 1  tablet (10 mg total) by mouth at bedtime. Wellington Curtis LABOR, FNP  Active Nursing Home Medication Administration Guide (MAG)  nortriptyline  (PAMELOR ) 50 MG capsule [id number] Yes Take 50 mg by mouth at bedtime. [provider]  Active Nursing Home Medication Administration Guide (MAG)           Med Note CATHY, PAULA   Wed Jan 12, 2024  2:37 PM) Been taking 1 capsule at bedtime  omeprazole  (PRILOSEC) 40 MG capsule [id number] Yes Take 1 capsule (40 mg total) by mouth daily. Wellington Curtis A, FNP  Active   polyethylene glycol (MIRALAX  / GLYCOLAX ) 17 g packet [id number] Yes Take 17 g by mouth daily. Wouk, Devaughn Sayres, MD  Active Nursing Home Medication Administration Guide (MAG)  risperiDONE  (RISPERDAL ) 2 MG tablet [id number] Yes Take 2 mg by mouth at bedtime. [provider]  Active Nursing Home Medication Administration Guide (MAG)  VENTOLIN  HFA 108 (90 Base) MCG/ACT inhaler [id number] Yes USE 2 INHALATIONS BY MOUTH EVERY 6 HOURS AS NEEDED Clifton, Kellie A, FNP  Active   VIMPAT  200 MG TABS tablet [id number] Yes Take 200 mg by mouth 2 (two) times daily.  [provider]  Active Nursing Home Medication Administration Guide [facility])           Med Note LUDGER, MADALYN BIRCH   Wed Apr 08, 2016 11:36 AM)    Med List Note (Wornah, Elizabeth A, CPhT 07/20/24 1532): [facility]            Recommendation:   PCP Follow-up [facility]  for ongoing mental health support Continue [facility] PT as scheduled  Follow Up Plan:   09/15/24 2:30pm  Adanna Zuckerman, LCSW [place]  [facility], [facility] Licensed Clinical Social Worker  Direct Dial: [phone]

## 2024-08-30 NOTE — Patient Instructions (Signed)
 Visit Information  Thank you for taking time to visit with me today. Please don't hesitate to contact me if I can be of assistance to you before our next scheduled appointment.  Our next appointment is by telephone on 09/15/24 at 2:30pm Please call the care guide team at [phone] if you need to cancel or reschedule your appointment.   Following is a copy of your care plan:   Goals Addressed             This Visit's Progress    VBCI Social Work Care Plan       Problems:   Lacks knowledge of how to connect ongoing mental health support  CSW Clinical Goal(s):   Over the next 90 days the Patient will work with Child psychotherapist to address concerns related to ongoing mental health support evidenced by patient report of initial appointment with [employer].  Interventions:  Mental Health:  Evaluation of current treatment plan related to schizoaffective d/o, depressive type Active listening / Reflection utilized Discussed referral for psychiatry: confirmed that patient has referral placed to [facility]-patient provided with the contact number to call and schedule initial appointment Participation in counseling encourage : benefits of consistent follow up discussed PHQ2/PHQ9 completed    Patient Goals/Self-Care Activities:  Patient to contact [facility] to schedule initial assessment  Plan:   Telephone follow up appointment with care management team member scheduled for:  09/15/24        Please call the Suicide and Crisis Lifeline: 988 call the [place]  National Suicide Prevention Lifeline: 609-381-0353 or TTY: 8011021882 TTY (773) 882-4068) to talk to a trained counselor call 1-800-273-TALK (toll free, 24 hour hotline) call 911 if you are experiencing a Mental Health or Behavioral Health Crisis or need someone to talk to.  The patient verbalized understanding of instructions, educational materials, and care plan  provided today and DECLINED offer to receive copy of patient instructions, educational materials, and care plan.   Franciscojavier Wronski, LCSW [place]  [facility], [facility] Licensed Clinical Social Worker  Direct Dial: [phone]

## 2024-09-04 ENCOUNTER — Ambulatory Visit: Admitting: Family Medicine

## 2024-09-08 ENCOUNTER — Ambulatory Visit (INDEPENDENT_AMBULATORY_CARE_PROVIDER_SITE_OTHER): Admitting: Family Medicine

## 2024-09-08 ENCOUNTER — Encounter: Payer: Self-pay | Admitting: Family Medicine

## 2024-09-08 VITALS — BP 138/78 | HR 80 | Temp 98.0°F | Wt 212.6 lb

## 2024-09-08 DIAGNOSIS — L602 Onychogryphosis: Secondary | ICD-10-CM

## 2024-09-08 DIAGNOSIS — I10 Essential (primary) hypertension: Secondary | ICD-10-CM

## 2024-09-08 DIAGNOSIS — R569 Unspecified convulsions: Secondary | ICD-10-CM | POA: Diagnosis not present

## 2024-09-08 DIAGNOSIS — R5381 Other malaise: Secondary | ICD-10-CM

## 2024-09-08 DIAGNOSIS — F250 Schizoaffective disorder, bipolar type: Secondary | ICD-10-CM

## 2024-09-08 DIAGNOSIS — L905 Scar conditions and fibrosis of skin: Secondary | ICD-10-CM | POA: Diagnosis not present

## 2024-09-08 DIAGNOSIS — E782 Mixed hyperlipidemia: Secondary | ICD-10-CM | POA: Diagnosis not present

## 2024-09-08 NOTE — Progress Notes (Signed)
 Established Patient Office Visit  Introduced to nurse practitioner role and practice setting.  All questions answered.  Discussed provider/patient relationship and expectations.  Subjective   Patient ID: Kelly [name], female    DOB: [birth year]  Age: 58 y.o. MRN: [id number]  Chief Complaint  Patient presents with   Medical Management of Chronic Issues   Nail Problem    Patient reports having thick toe nails and discolored. She would like her toenails to be trimmed.   Discussed the use of AI scribe software for clinical note transcription with the patient, who gave verbal consent to proceed.  History of Present Illness Kelly [name] is a 58 year old female who presents for follow up on chronic disease, coordination of care, thick toe nails, and concerns about lip scar.   She is experiencing thickened, black toenails, which cause pain and affect her walking. She has a history of trauma to her nails and is seeking specialized care from a podiatrist through referral .  She has a 25-year history of seizures, and states she has an upcoming EEG planned to rule out epilepsy, as she and her neurologist working up from a psychological origin. An EEG is scheduled for November 21st. Previous EEGs have shown some activity, but not typical of epilepsy per patient. She associates her seizures with anxiety and describes them as 'psychological seizures'.  She has swelling in her lip, attributed to biting during a seizure a little over a month ago. The swelling has decreased but there is persistent scar tissue. She uses Listerine to manage the swelling, but it recurs, requiring frequent use.  She receives home health services, including physical therapy, which she finds beneficial for her strength. She has recently started working with a child psychotherapist, who has been helpful in providing mental health resources and assiting her in coordination of care.  She states is scheduled to see  mental health services on November 5th. She has transportation assistance for her appointments.      08/29/2024    2:30 PM 08/08/2024   10:18 AM 05/30/2024    1:03 PM  Depression screen PHQ 2/9  Decreased Interest 2 3   Down, Depressed, Hopeless 1 3   PHQ - 2 Score 3 6   Altered sleeping 0 2   Tired, decreased energy 2 2   Change in appetite 0 2   Feeling bad or failure about yourself  0 1   Trouble concentrating 0 2   Moving slowly or fidgety/restless 0 2   Suicidal thoughts 0 1   PHQ-9 Score 5 18   Difficult doing work/chores Somewhat difficult Very difficult      Information is confidential and restricted. Go to Review Flowsheets to unlock data.       08/29/2024    2:44 PM 08/08/2024   10:18 AM 05/30/2024    1:03 PM 05/04/2024   11:11 AM  GAD 7 : Generalized Anxiety Score  Nervous, Anxious, on Edge 1 1  2   Control/stop worrying 0 1  2  Worry too much - different things 0 1  2  Trouble relaxing 0 1  3  Restless 1 1  0  Easily annoyed or irritable 3 1  0  Afraid - awful might happen 1 1  2   Total GAD 7 Score 6 7  11   Anxiety Difficulty Somewhat difficult Somewhat difficult  Very difficult     Information is confidential and restricted. Go to Review Flowsheets to unlock data.  ROS  Negative unless indicated in HPI   Objective:     BP 138/78 (BP Location: Left Arm, Patient Position: Sitting, Cuff Size: Large)   Pulse 80   Temp 98 F (36.7 C) (Oral)   Wt 212 lb 9.6 oz (96.4 kg)   LMP  (LMP Unknown)   SpO2 98%   BMI 34.31 kg/m    Physical Exam Constitutional:      General: She is not in acute distress.    Appearance: Normal appearance. She is obese. She is not ill-appearing, toxic-appearing or diaphoretic.  HENT:     Head: Normocephalic.     Nose: Nose normal.     Mouth/Throat:     Mouth: Mucous membranes are moist.     Pharynx: Oropharynx is clear.  Eyes:     Extraocular Movements: Extraocular movements intact.     Pupils: Pupils are equal,  round, and reactive to light.  Cardiovascular:     Rate and Rhythm: Normal rate and regular rhythm.     Pulses: Normal pulses.     Heart sounds: Normal heart sounds. No murmur heard.    No friction rub. No gallop.  Pulmonary:     Effort: No respiratory distress.     Breath sounds: No stridor. No wheezing, rhonchi or rales.  Chest:     Chest wall: No tenderness.  Musculoskeletal:     Right lower leg: No edema.     Left lower leg: No edema.  Skin:    General: Skin is warm and dry.     Capillary Refill: Capillary refill takes less than 2 seconds.  Neurological:     General: No focal deficit present.     Mental Status: She is alert and oriented to person, place, and time. Mental status is at baseline.     GCS: GCS eye subscore is 4. GCS verbal subscore is 5. GCS motor subscore is 6.     Cranial Nerves: Cranial nerves 2-12 are intact.     Motor: Tremor present.     Comments: Baseline tremors - pt states from AEDs  Psychiatric:        Mood and Affect: Mood normal. Affect is blunt.        Behavior: Behavior normal.        Thought Content: Thought content normal.        Judgment: Judgment normal.      No results found for any visits on 09/08/24.    The ASCVD Risk score (Arnett DK, et al., 2019) failed to calculate for the following reasons:   Risk score cannot be calculated because patient has a medical history suggesting prior/existing ASCVD    Assessment & Plan:  Hypertrophic toenail -     Ambulatory referral to Podiatry  Scar of skin of upper lip  Seizures (HCC)  Mixed hyperlipidemia  Primary hypertension  Schizoaffective disorder, bipolar type (HCC)  Physical deconditioning     Assessment and Plan Assessment & Plan Hypertrophic toenails Thickened and discolored toenails, possibly due to historical trauma - affecting ambulation, shoes starting to hurt, and risk of causing wounds. - Refer to podiatrist  Scar tissue of upper lip,  - post-traumatic, from lip  biting during seizure in September 2025 - lip has healed, not open, but there is still some swelling, thicken scar tissue present on lip and within lip mucosa - Advise warm compresses for 5-10 minutes daily. - Recommend massaging the area for 5-10 minutes daily. - Suggest using over-the-counter vitamin E ointment or  Aquaphor to aid scar healing.  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 Neurology does not suspect epilepsy. - Proceed with EEG on November 21st.  Mood Disorder Hx of Schizoaffective Disorder, Bipolar type - recent several months lapse in mental health care specialist - multiple referrals in past - Continue Risperdal  and nortriptyline  (for headaches/neck pain) - neurology managing at this time, hx of seroquel  - continue gabapentin  - neuro manages - hard to assess if she is taking currently - poor historian at times - Has appointment with behavioral health provider September 19, 2024 per chart review - highly advised to keep - coordination of care on going with assistance from social work  Complex Care Mgmt - coordination of care assistance with socal work established recently - assisting pt in transportation and mental health, rehab resources  Deconditioned, and hx of frequent falls - continue working with physical therapy with home health services - ambulating much better today with rolling walker than previous visit - patient getting stronger - much better  spirits today  General Health Maintenance Home health services, including physical therapy and social work, are in place and beneficial. Transportation assistance is available for appointments.  Return in about 3 months (around 12/09/2024) for Chronic Disease Mgmt and Labs.   I, Curtis DELENA Boom, FNP, have reviewed all documentation for this visit. The documentation on 09/09/24 for the exam, diagnosis, procedures, and orders are all accurate and complete.   Curtis DELENA Boom, FNP

## 2024-09-09 ENCOUNTER — Encounter: Payer: Self-pay | Admitting: Family Medicine

## 2024-09-15 ENCOUNTER — Telehealth: Payer: Self-pay | Admitting: *Deleted

## 2024-09-15 ENCOUNTER — Encounter: Payer: Self-pay | Admitting: *Deleted

## 2024-09-15 NOTE — Patient Instructions (Signed)
 Kelly [name] - I am sorry I was unable to reach you today. I work with Wellington Curtis LABOR, FNP and am calling to support your healthcare needs. Please contact me at [phone]  at your earliest convenience. I look forward to speaking with you soon.   Thank you,    Ashia Dehner, LCSW [place]  [facility], [facility] Licensed Clinical Social Worker  Direct Dial: [phone]

## 2024-09-19 ENCOUNTER — Ambulatory Visit: Admitting: Psychiatry

## 2024-09-26 ENCOUNTER — Ambulatory Visit: Admitting: Podiatry

## 2024-09-29 ENCOUNTER — Telehealth: Payer: Self-pay

## 2024-09-29 NOTE — Telephone Encounter (Signed)
 Copied from CRM [id number]. Topic: General - Other >> Sep 29, 2024  8:55 AM Jasmin G wrote: Reason for CRM: Ms. Harlene with [facility] called to let Ms. Clifton's team know that she has  not been able to get in contact with pt for about a week, and so services could not be offered. Call Ms. Glade if needed at [phone], it's okay to leave voicemail.

## 2024-09-29 NOTE — Telephone Encounter (Signed)
 Noted TY

## 2024-10-02 ENCOUNTER — Other Ambulatory Visit: Payer: Self-pay | Admitting: Family Medicine

## 2024-10-02 DIAGNOSIS — K219 Gastro-esophageal reflux disease without esophagitis: Secondary | ICD-10-CM

## 2024-10-06 ENCOUNTER — Other Ambulatory Visit: Payer: Self-pay | Admitting: *Deleted

## 2024-10-06 NOTE — Patient Instructions (Signed)
 Visit Information  Thank you for taking time to visit with me today. Please don't hesitate to contact me if I can be of assistance to you before our next scheduled appointment.  Your next care management appointment is by telephone on 10/26/24 at 1:30pm   Please call the care guide team at [phone] if you need to cancel, schedule, or reschedule an appointment.   Please call the Suicide and Crisis Lifeline: 988 call the [place]  National Suicide Prevention Lifeline: (249)101-0402 or TTY: 279-828-3182 TTY 701-707-9430) to talk to a trained counselor call 1-800-273-TALK (toll free, 24 hour hotline) call 911 if you are experiencing a Mental Health or Behavioral Health Crisis or need someone to talk to.  Yaresly Menzel, LCSW [place]  [facility], [facility] Licensed Clinical Social Worker  Direct Dial: [phone]

## 2024-10-06 NOTE — Patient Outreach (Signed)
 Complex Care Management   Visit Note  10/06/2024  Name:  Kelly [name] MRN: [id number] DOB: November 20, 1965  Situation: Referral received for Complex Care Management related to Mental/Behavioral Health diagnosis Schizoaffective disorder, depressive type. I obtained verbal consent from Patient.  Visit completed with Patient  on the phone  Background:   Past Medical History:  Diagnosis Date   Acid reflux    Anxiety    Depression    Hypercholesteremia    Hypertension    Myocardial infarction (HCC)    Schizophrenia (HCC)    Seizures (HCC)    Sleep apnea     Assessment: Patient Reported Symptoms:  Cognitive Cognitive Status: Alert and oriented to person, place, and time, Normal speech and language skills, Able to follow simple commands Cognitive/Intellectual Conditions Management [RPT]: Other Other: seizures   Health Maintenance Behaviors: Annual physical exam Healing Pattern: Average Health Facilitated by: Rest  Neurological Neurological Review of Symptoms: Headaches Oher Neurological Symptoms/Conditions [RPT]: patient continues to have seizures-appointment with psychiatrist had to be re-scheduled due to siezure activity and nausea Neurological Management Strategies: Medication therapy  HEENT HEENT Symptoms Reported: No symptoms reported      Cardiovascular Cardiovascular Symptoms Reported: No symptoms reported    Respiratory Respiratory Symptoms Reported: No symptoms reported    Endocrine Endocrine Symptoms Reported: No symptoms reported Is patient diabetic?: No    Gastrointestinal Gastrointestinal Symptoms Reported: Nausea Gastrointestinal Management Strategies: Adequate rest    Genitourinary Genitourinary Symptoms Reported: Incontinence Additional Genitourinary Details: usually wets bed when having seizures    Integumentary Integumentary Symptoms Reported: No symptoms reported    Musculoskeletal Musculoskelatal Symptoms Reviewed: Not assessed Additional  Musculoskeletal Details: uses walker to ambulate-no longer receives PT-course completed Musculoskeletal Management Strategies: Medication therapy      Psychosocial Psychosocial Symptoms Reported: Depression - if selected complete PHQ 2-9 Additional Psychological Details: referral made to [facility]-appointment had to be rescheduled due to seizures Behavioral Management Strategies: Adequate rest, Coping strategies Major Change/Loss/Stressor/Fears (CP): Medical condition, self Behaviors When Feeling Stressed/Fearful: close my doors and watch TV Techniques to Cope with Loss/Stress/Change: Diversional activities Quality of Family Relationships: non-existent Do you feel physically threatened by others?: No    10/06/2024    PHQ2-9 Depression Screening   Little interest or pleasure in doing things    Feeling down, depressed, or hopeless    PHQ-2 - Total Score    Trouble falling or staying asleep, or sleeping too much    Feeling tired or having little energy    Poor appetite or overeating     Feeling bad about yourself - or that you are a failure or have let yourself or your family down    Trouble concentrating on things, such as reading the newspaper or watching television    Moving or speaking so slowly that other people could have noticed.  Or the opposite - being so fidgety or restless that you have been moving around a lot more than usual    Thoughts that you would be better off dead, or hurting yourself in some way    PHQ2-9 Total Score    If you checked off any problems, how difficult have these problems made it for you to do your work, take care of things at home, or get along with other people    Depression Interventions/Treatment      There were no vitals filed for this visit.    Medications Reviewed Today     Reviewed by Ermalinda Lenn HERO, LCSW (Social Worker)  on 10/06/24 at 1357  Med List Status: <None>   Medication Order Taking? Sig Documenting  Provider Last Dose Status Informant  amLODipine  (NORVASC ) 5 MG tablet [id number] Yes Take 1 tablet (5 mg total) by mouth daily. Wellington Curtis LABOR, FNP  Active Nursing Home Medication Administration Guide (MAG)  atorvastatin  (LIPITOR) 40 MG tablet [id number] Yes TAKE 1 TABLET BY MOUTH AT  BEDTIME Clifton, Kellie A, FNP  Active   cloNIDine  (CATAPRES ) 0.1 MG tablet [id number] Yes TAKE 1 TABLET BY MOUTH DAILY Clifton, Kellie A, FNP  Active   divalproex  (DEPAKOTE ) 500 MG DR tablet [id number] Yes Take 500 mg by mouth 2 (two) times daily. [provider]  Active Nursing Home Medication Administration Guide (MAG)  ferrous sulfate  325 (65 FE) MG tablet [id number] Yes Take 1 tablet (325 mg total) by mouth daily with breakfast. Franchot Novel, MD  Active   gabapentin  (NEURONTIN ) 300 MG capsule [id number] Yes Take 1 (one) 300 mg tablet at breakfast and 1 (one)  300mg  tablet at lunch time. Take 3 (three) 300mg  tablets before bedtime. Franchot Novel, MD  Active   lamoTRIgine  (LAMICTAL ) 200 MG tablet [id number] Yes Take 1 tablet (200 mg total) by mouth 2 (two) times daily. Sherial Bail, MD  Active Nursing Home Medication Administration Guide (MAG)  losartan  (COZAAR ) 25 MG tablet [id number] Yes Take 1 tablet (25 mg total) by mouth daily. Wellington Curtis LABOR, FNP  Active Nursing Home Medication Administration Guide (MAG)  montelukast  (SINGULAIR ) 10 MG tablet [id number] Yes Take 1 tablet (10 mg total) by mouth at bedtime. Wellington Curtis LABOR, FNP  Active Nursing Home Medication Administration Guide (MAG)  nortriptyline  (PAMELOR ) 50 MG capsule [id number] Yes Take 50 mg by mouth at bedtime. [provider]  Active Nursing Home Medication Administration Guide (MAG)           Med Note CATHY MOCCASIN   Wed Jan 12, 2024  2:37 PM) Been taking 1 capsule at bedtime  omeprazole  (PRILOSEC) 40 MG capsule [id number] Yes TAKE 1 CAPSULE BY MOUTH DAILY Wellington Curtis A, FNP  Active   polyethylene glycol (MIRALAX  /  GLYCOLAX ) 17 g packet [id number] Yes Take 17 g by mouth daily. Wouk, Devaughn Sayres, MD  Active Nursing Home Medication Administration Guide (MAG)  risperiDONE  (RISPERDAL ) 2 MG tablet [id number] Yes Take 2 mg by mouth at bedtime. [provider]  Active Nursing Home Medication Administration Guide (MAG)  VENTOLIN  HFA 108 (90 Base) MCG/ACT inhaler [id number] Yes USE 2 INHALATIONS BY MOUTH EVERY 6 HOURS AS NEEDED Wellington Curtis A, FNP  Active   VIMPAT  200 MG TABS tablet [id number] Yes Take 200 mg by mouth 2 (two) times daily.  [provider]  Active Nursing Home Medication Administration Guide [facility])           Med Note LUDGER, MADALYN BIRCH   Wed Apr 08, 2016 11:36 AM)    Med List Note (Wornah, Elizabeth A, CPhT 07/20/24 1532): [facility]            Recommendation:   PCP Follow-up Specialty provider follow-up as scheduled  [facility] [phone]  Follow Up Plan:   Telephone follow up appointment date/time:  10/26/24 1:30pm  Takota Cahalan Ermalinda HUGHS [place]  [facility] Licensed Clinical Social Worker  Direct Dial: [phone]

## 2024-10-17 ENCOUNTER — Other Ambulatory Visit: Payer: Self-pay

## 2024-10-26 ENCOUNTER — Other Ambulatory Visit: Payer: Self-pay | Admitting: *Deleted

## 2024-10-26 NOTE — Patient Instructions (Signed)
 Visit Information  Thank you for taking time to visit with me today. Please don't hesitate to contact me if I can be of assistance to you before our next scheduled appointment.  Your next care management appointment is by telephone on 11/22/24 at 1pm  Telephone follow up appointment date/time:  11/22/24  Please call the care guide team at [phone] if you need to cancel, schedule, or reschedule an appointment.   Please call the Suicide and Crisis Lifeline: 988 call the [place]  National Suicide Prevention Lifeline: (586)031-1351 or TTY: (534)504-1571 TTY (781) 005-1952) to talk to a trained counselor call 1-800-273-TALK (toll free, 24 hour hotline) call 911 if you are experiencing a Mental Health or Behavioral Health Crisis or need someone to talk to.  Tequia Wolman, LCSW [place]  [facility], [facility] Licensed Clinical Social Worker  Direct Dial: [phone]

## 2024-10-27 NOTE — Patient Outreach (Signed)
 Complex Care Management   Visit Note  10/27/2024  Name:  Kelly [name] MRN: [id number] DOB: 04-Apr-1966  Situation:  Referral received for Complex Care Management related to Mental/Behavioral Health diagnosis Schizoaffective disorder, depressive type. I obtained verbal consent from Patient. Visit completed with Patient on the phone on 10/26/24.  Background:   Past Medical History:  Diagnosis Date   Acid reflux    Anxiety    Depression    Hypercholesteremia    Hypertension    Myocardial infarction (HCC)    Schizophrenia (HCC)    Seizures (HCC)    Sleep apnea     Assessment: Patient Reported Symptoms:  Cognitive Cognitive Status: Alert and oriented to person, place, and time, Normal speech and language skills      Neurological Neurological Review of Symptoms: Headaches Oher Neurological Symptoms/Conditions [RPT]: Had recent EEG Neurological Comment: follow up with Neurologist on 11/29/24  HEENT HEENT Symptoms Reported: No symptoms reported      Cardiovascular Cardiovascular Symptoms Reported: No symptoms reported    Respiratory Respiratory Symptoms Reported: No symptoms reported    Endocrine      Gastrointestinal Gastrointestinal Symptoms Reported: No symptoms reported      Genitourinary Genitourinary Symptoms Reported: No symptoms reported    Integumentary Integumentary Symptoms Reported: No symptoms reported    Musculoskeletal Musculoskelatal Symptoms Reviewed: No symptoms reported Additional Musculoskeletal Details: uses walker to ambulate-when going out in the community ie grocery store-has follow up podiatrist however not able to remember date        Psychosocial Psychosocial Symptoms Reported: Depression - if selected complete PHQ 2-9 Additional Psychological Details: patient missed appointment with Psychiatrist -agrees to call can re-schedule Behavioral Management Strategies: Adequate rest, Coping strategies Behaviors When Feeling  Stressed/Fearful: watches TV Techniques to Cope with Loss/Stress/Change: Diversional activities Quality of Family Relationships: non-existent Do you feel physically threatened by others?: No    10/27/2024    PHQ2-9 Depression Screening   Little interest or pleasure in doing things    Feeling down, depressed, or hopeless    PHQ-2 - Total Score    Trouble falling or staying asleep, or sleeping too much    Feeling tired or having little energy    Poor appetite or overeating     Feeling bad about yourself - or that you are a failure or have let yourself or your family down    Trouble concentrating on things, such as reading the newspaper or watching television    Moving or speaking so slowly that other people could have noticed.  Or the opposite - being so fidgety or restless that you have been moving around a lot more than usual    Thoughts that you would be better off dead, or hurting yourself in some way    PHQ2-9 Total Score    If you checked off any problems, how difficult have these problems made it for you to do your work, take care of things at home, or get along with other people    Depression Interventions/Treatment      There were no vitals filed for this visit.    Medications Reviewed Today     Reviewed by Ermalinda Lenn HERO, LCSW (Social Worker) on 10/27/24 at 0807  Med List Status: <None>   Medication Order Taking? Sig Documenting Provider Last Dose Status Informant  amLODipine  (NORVASC ) 5 MG tablet [id number] Yes Take 1 tablet (5 mg total) by mouth daily. Wellington Curtis LABOR, FNP  Active Nursing Home Medication Administration Guide (MAG)  atorvastatin  (LIPITOR) 40 MG tablet [id number] Yes TAKE 1 TABLET BY MOUTH AT  BEDTIME Wellington Beams A, FNP  Active   cloNIDine  (CATAPRES ) 0.1 MG tablet [id number] Yes TAKE 1 TABLET BY MOUTH DAILY Clifton, Kellie A, FNP  Active   divalproex  (DEPAKOTE ) 500 MG DR tablet [id number] Yes Take 500 mg by mouth 2 (two) times daily. [provider]  Active Nursing Home Medication Administration Guide (MAG)  ferrous sulfate  325 (65 FE) MG tablet [id number] Yes Take 1 tablet (325 mg total) by mouth daily with breakfast. Franchot Novel, MD  Active   gabapentin  (NEURONTIN ) 300 MG capsule [id number] Yes Take 1 (one) 300 mg tablet at breakfast and 1 (one)  300mg  tablet at lunch time. Take 3 (three) 300mg  tablets before bedtime. Franchot Novel, MD  Active   lamoTRIgine  (LAMICTAL ) 200 MG tablet [id number] Yes Take 1 tablet (200 mg total) by mouth 2 (two) times daily. Sherial Bail, MD  Active Nursing Home Medication Administration Guide (MAG)  losartan  (COZAAR ) 25 MG tablet [id number] Yes Take 1 tablet (25 mg total) by mouth daily. Wellington Beams LABOR, FNP  Active Nursing Home Medication Administration Guide (MAG)  montelukast  (SINGULAIR ) 10 MG tablet [id number] Yes Take 1 tablet (10 mg total) by mouth at bedtime. Wellington Beams LABOR, FNP  Active Nursing Home Medication Administration Guide (MAG)  nortriptyline  (PAMELOR ) 50 MG capsule [id number] Yes Take 50 mg by mouth at bedtime. [provider]  Active Nursing Home Medication Administration Guide (MAG)           Med Note CATHY MOCCASIN   Wed Jan 12, 2024  2:37 PM) Been taking 1 capsule at bedtime  omeprazole  (PRILOSEC) 40 MG capsule [id number] Yes TAKE 1 CAPSULE BY MOUTH DAILY Wellington Beams A, FNP  Active   polyethylene glycol (MIRALAX  / GLYCOLAX ) 17 g packet [id number] Yes Take 17 g by mouth daily. Wouk, Devaughn Sayres, MD  Active Nursing Home Medication Administration Guide (MAG)  risperiDONE  (RISPERDAL ) 2 MG tablet [id number] Yes Take 2 mg by mouth at bedtime. [provider]  Active Nursing Home Medication Administration Guide (MAG)  VENTOLIN  HFA 108 (90 Base) MCG/ACT inhaler [id number] Yes USE 2 INHALATIONS BY MOUTH EVERY 6 HOURS AS NEEDED Clifton, Kellie A, FNP  Active   VIMPAT  200 MG TABS tablet [id number] Yes Take 200 mg by mouth 2 (two) times daily.  [provider]  Active Nursing Home Medication Administration Guide [facility])           Med Note LUDGER, MADALYN BIRCH   Wed Apr 08, 2016 11:36 AM)    Med List Note (Wornah, Elizabeth A, CPhT 07/20/24 1532): [facility]            Recommendation:   PCP Follow-up Specialty provider follow-up as scheduled  [facility]-medication management  Follow Up Plan:   Telephone follow up appointment date/time:  11/22/24  Lenn Mean, LCSW [place]  [facility], [facility] Licensed Clinical Social Worker  Direct Dial: [phone]

## 2024-11-22 ENCOUNTER — Telehealth: Payer: Self-pay | Admitting: *Deleted

## 2024-11-28 ENCOUNTER — Telehealth: Payer: Self-pay

## 2024-11-28 NOTE — Telephone Encounter (Signed)
 Patient of Kellie's, has ongoing relationship with Dr Lane ( ? Seizure d/o).  Will need a  referral.  Was seen by Curtis in October. Has her appt with you on 12/07/24 but isn't schedule with Dr Lane until April for 3 month f/u from Dec visit.  You may want to hold this until she is seen as it won't be needed until April, not sure why she is asking for it now   Copied from CRM #[id number]. Topic: Referral - Request for Referral >> Nov 28, 2024  9:52 AM Charolett L wrote: Did the patient discuss referral with their provider in the last year? Yes (If No - schedule appointment) (If Yes - send message)  Appointment offered? Yes  Type of order/referral and detailed reason for visit: neurologist  Preference of office, provider, location: Arthea Lane Address: [address] [place], [place] [address] Phone: [phone] Fax: [phone]  If referral order, have you been seen by this specialty before? Yes (If Yes, this issue or another issue? When? Where?  Can we respond through MyChart? Yes

## 2024-11-28 NOTE — Telephone Encounter (Signed)
 Will discuss with patient at upcoming appointment.

## 2024-12-07 ENCOUNTER — Encounter

## 2025-01-17 ENCOUNTER — Ambulatory Visit: Payer: 59

## 2025-01-26 ENCOUNTER — Encounter: Admitting: Physician Assistant
